# Patient Record
Sex: Female | Born: 1975 | Race: Black or African American | Hispanic: Yes | Marital: Single | Smoking: Former smoker
Health system: Southern US, Community
[De-identification: ages and names within clinical notes are randomized; demographics above are authoritative.]

## PROBLEM LIST (undated history)

## (undated) ENCOUNTER — Ambulatory Visit (HOSPITAL_COMMUNITY): Admission: EM | Payer: Medicaid Other | Source: Home / Self Care

## (undated) DIAGNOSIS — K219 Gastro-esophageal reflux disease without esophagitis: Secondary | ICD-10-CM

## (undated) DIAGNOSIS — R7303 Prediabetes: Secondary | ICD-10-CM

## (undated) DIAGNOSIS — D649 Anemia, unspecified: Secondary | ICD-10-CM

## (undated) DIAGNOSIS — Z5189 Encounter for other specified aftercare: Secondary | ICD-10-CM

## (undated) DIAGNOSIS — I10 Essential (primary) hypertension: Secondary | ICD-10-CM

## (undated) HISTORY — DX: Anemia, unspecified: D64.9

## (undated) HISTORY — DX: Encounter for other specified aftercare: Z51.89

## (undated) HISTORY — DX: Essential (primary) hypertension: I10

## (undated) HISTORY — DX: Prediabetes: R73.03

## (undated) HISTORY — DX: Gastro-esophageal reflux disease without esophagitis: K21.9

## (undated) HISTORY — PX: DILATION AND CURETTAGE OF UTERUS: SHX78

## (undated) HISTORY — PX: SKIN GRAFT: SHX250

---

## 2016-03-19 ENCOUNTER — Ambulatory Visit: Payer: Medicaid Other | Admitting: Internal Medicine

## 2016-04-16 ENCOUNTER — Ambulatory Visit: Payer: Medicaid Other | Admitting: Internal Medicine

## 2016-04-17 ENCOUNTER — Ambulatory Visit: Payer: Medicaid Other | Attending: Internal Medicine | Admitting: Internal Medicine

## 2016-04-17 ENCOUNTER — Encounter: Payer: Self-pay | Admitting: Internal Medicine

## 2016-04-17 VITALS — BP 124/85 | HR 108 | Temp 98.6°F | Resp 16 | Ht 66.5 in | Wt 212.0 lb

## 2016-04-17 DIAGNOSIS — Z720 Tobacco use: Secondary | ICD-10-CM

## 2016-04-17 DIAGNOSIS — Z6833 Body mass index (BMI) 33.0-33.9, adult: Secondary | ICD-10-CM | POA: Diagnosis not present

## 2016-04-17 DIAGNOSIS — N920 Excessive and frequent menstruation with regular cycle: Secondary | ICD-10-CM | POA: Insufficient documentation

## 2016-04-17 DIAGNOSIS — D259 Leiomyoma of uterus, unspecified: Secondary | ICD-10-CM | POA: Insufficient documentation

## 2016-04-17 DIAGNOSIS — K219 Gastro-esophageal reflux disease without esophagitis: Secondary | ICD-10-CM

## 2016-04-17 DIAGNOSIS — M79621 Pain in right upper arm: Secondary | ICD-10-CM | POA: Diagnosis present

## 2016-04-17 DIAGNOSIS — T86829 Unspecified complication of skin graft (allograft) (autograft): Secondary | ICD-10-CM

## 2016-04-17 DIAGNOSIS — N926 Irregular menstruation, unspecified: Secondary | ICD-10-CM

## 2016-04-17 LAB — CBC WITH DIFFERENTIAL/PLATELET
BASOS ABS: 0 {cells}/uL (ref 0–200)
Basophils Relative: 0 %
EOS PCT: 3 %
Eosinophils Absolute: 282 cells/uL (ref 15–500)
HCT: 40.3 % (ref 35.0–45.0)
HEMOGLOBIN: 13.4 g/dL (ref 11.7–15.5)
LYMPHS ABS: 3290 {cells}/uL (ref 850–3900)
LYMPHS PCT: 35 %
MCH: 25.8 pg — AB (ref 27.0–33.0)
MCHC: 33.3 g/dL (ref 32.0–36.0)
MCV: 77.5 fL — ABNORMAL LOW (ref 80.0–100.0)
MONOS PCT: 8 %
MPV: 9.3 fL (ref 7.5–12.5)
Monocytes Absolute: 752 cells/uL (ref 200–950)
NEUTROS PCT: 54 %
Neutro Abs: 5076 cells/uL (ref 1500–7800)
Platelets: 411 10*3/uL — ABNORMAL HIGH (ref 140–400)
RBC: 5.2 MIL/uL — AB (ref 3.80–5.10)
RDW: 15.1 % — AB (ref 11.0–15.0)
WBC: 9.4 10*3/uL (ref 3.8–10.8)

## 2016-04-17 NOTE — Patient Instructions (Signed)
Smoking Cessation, Tips for Success If you are ready to quit smoking, congratulations! You have chosen to help yourself be healthier. Cigarettes bring nicotine, tar, carbon monoxide, and other irritants into your body. Your lungs, heart, and blood vessels will be able to work better without these poisons. There are many different ways to quit smoking. Nicotine gum, nicotine patches, a nicotine inhaler, or nicotine nasal spray can help with physical craving. Hypnosis, support groups, and medicines help break the habit of smoking. WHAT THINGS CAN I DO TO MAKE QUITTING EASIER?  Here are some tips to help you quit for good:  Pick a date when you will quit smoking completely. Tell all of your friends and family about your plan to quit on that date.  Do not try to slowly cut down on the number of cigarettes you are smoking. Pick a quit date and quit smoking completely starting on that day.  Throw away all cigarettes.   Clean and remove all ashtrays from your home, work, and car.  On a card, write down your reasons for quitting. Carry the card with you and read it when you get the urge to smoke.  Cleanse your body of nicotine. Drink enough water and fluids to keep your urine clear or pale yellow. Do this after quitting to flush the nicotine from your body.  Learn to predict your moods. Do not let a bad situation be your excuse to have a cigarette. Some situations in your life might tempt you into wanting a cigarette.  Never have "just one" cigarette. It leads to wanting another and another. Remind yourself of your decision to quit.  Change habits associated with smoking. If you smoked while driving or when feeling stressed, try other activities to replace smoking. Stand up when drinking your coffee. Brush your teeth after eating. Sit in a different chair when you read the paper. Avoid alcohol while trying to quit, and try to drink fewer caffeinated beverages. Alcohol and caffeine may urge you to  smoke.  Avoid foods and drinks that can trigger a desire to smoke, such as sugary or spicy foods and alcohol.  Ask people who smoke not to smoke around you.  Have something planned to do right after eating or having a cup of coffee. For example, plan to take a walk or exercise.  Try a relaxation exercise to calm you down and decrease your stress. Remember, you may be tense and nervous for the first 2 weeks after you quit, but this will pass.  Find new activities to keep your hands busy. Play with a pen, coin, or rubber band. Doodle or draw things on paper.  Brush your teeth right after eating. This will help cut down on the craving for the taste of tobacco after meals. You can also try mouthwash.   Use oral substitutes in place of cigarettes. Try using lemon drops, carrots, cinnamon sticks, or chewing gum. Keep them handy so they are available when you have the urge to smoke.  When you have the urge to smoke, try deep breathing.  Designate your home as a nonsmoking area.  If you are a heavy smoker, ask your health care provider about a prescription for nicotine chewing gum. It can ease your withdrawal from nicotine.  Reward yourself. Set aside the cigarette money you save and buy yourself something nice.  Look for support from others. Join a support group or smoking cessation program. Ask someone at home or at work to help you with your plan   to quit smoking.  Always ask yourself, "Do I need this cigarette or is this just a reflex?" Tell yourself, "Today, I choose not to smoke," or "I do not want to smoke." You are reminding yourself of your decision to quit.  Do not replace cigarette smoking with electronic cigarettes (commonly called e-cigarettes). The safety of e-cigarettes is unknown, and some may contain harmful chemicals.  If you relapse, do not give up! Plan ahead and think about what you will do the next time you get the urge to smoke. HOW WILL I FEEL WHEN I QUIT SMOKING? You  may have symptoms of withdrawal because your body is used to nicotine (the addictive substance in cigarettes). You may crave cigarettes, be irritable, feel very hungry, cough often, get headaches, or have difficulty concentrating. The withdrawal symptoms are only temporary. They are strongest when you first quit but will go away within 10-14 days. When withdrawal symptoms occur, stay in control. Think about your reasons for quitting. Remind yourself that these are signs that your body is healing and getting used to being without cigarettes. Remember that withdrawal symptoms are easier to treat than the major diseases that smoking can cause.  Even after the withdrawal is over, expect periodic urges to smoke. However, these cravings are generally short lived and will go away whether you smoke or not. Do not smoke! WHAT RESOURCES ARE AVAILABLE TO HELP ME QUIT SMOKING? Your health care provider can direct you to community resources or hospitals for support, which may include:  Group support.  Education.  Hypnosis.  Therapy.   This information is not intended to replace advice given to you by your health care provider. Make sure you discuss any questions you have with your health care provider.   Document Released: 08/17/2004 Document Revised: 12/10/2014 Document Reviewed: 05/07/2013 Elsevier Interactive Patient Education 2016 [place] to [employer] Exercising can help you to lose weight. In order to lose weight through exercise, you need to do vigorous-intensity exercise. You can tell that you are exercising with vigorous intensity if you are breathing very hard and fast and cannot hold a conversation while exercising. Moderate-intensity exercise helps to maintain your current weight. You can tell that you are exercising at a moderate level if you have a higher heart rate and faster breathing, but you are still able to hold a conversation. HOW OFTEN SHOULD I EXERCISE? Choose  an activity that you enjoy and set realistic goals. Your health care provider can help you to make an activity plan that works for you. Exercise regularly as directed by your health care provider. This may include:  Doing resistance training twice each week, such as:  Push-ups.  Sit-ups.  Lifting weights.  Using resistance bands.  Doing a given intensity of exercise for a given amount of time. Choose from these options:  150 minutes of moderate-intensity exercise every week.  75 minutes of vigorous-intensity exercise every week.  A mix of moderate-intensity and vigorous-intensity exercise every week. Children, pregnant women, people who are out of shape, people who are overweight, and older adults may need to consult a health care provider for individual recommendations. If you have any sort of medical condition, be sure to consult your health care provider before starting a new exercise program. WHAT ARE SOME ACTIVITIES THAT CAN HELP ME TO LOSE WEIGHT?   Walking at a rate of at least 4.5 miles an hour.  Jogging or running at a rate of 5 miles per hour.  Biking at a rate of at least 10 miles per hour.  Lap swimming.  Roller-skating or in-line skating.  Cross-country skiing.  Vigorous competitive sports, such as football, basketball, and soccer.  Jumping rope.  Aerobic dancing. HOW CAN I BE MORE ACTIVE IN MY DAY-TO-DAY ACTIVITIES?  Use the stairs instead of the elevator.  Take a walk during your lunch break.  If you drive, park your car farther away from work or school.  If you take public transportation, get off one stop early and walk the rest of the way.  Make all of your phone calls while standing up and walking around.  Get up, stretch, and walk around every 30 minutes throughout the day. WHAT GUIDELINES SHOULD I FOLLOW WHILE EXERCISING?  Do not exercise so much that you hurt yourself, feel dizzy, or get very short of breath.  Consult your health care  provider prior to starting a new exercise program.  Wear comfortable clothes and shoes with good support.  Drink plenty of water while you exercise to prevent dehydration or heat stroke. Body water is lost during exercise and must be replaced.  Work out until you breathe faster and your heart beats faster.     This information is not intended to replace advice given to you by your health care provider. Make sure you discuss any questions you have with your health care provider.   Document Released: 12/22/2010 Document Revised: 12/10/2014 Document Reviewed: 04/22/2014 Elsevier Interactive Patient Education 2016 [place] for [employer] Calories are energy you get from the things you eat and drink. Your body uses this energy to keep you going throughout the day. The number of calories you eat affects your weight. When you eat more calories than your body needs, your body stores the extra calories as fat. When you eat fewer calories than your body needs, your body burns fat to get the energy it needs. Calorie counting means keeping track of how many calories you eat and drink each day. If you make sure to eat fewer calories than your body needs, you should lose weight. In order for calorie counting to work, you will need to eat the number of calories that are right for you in a day to lose a healthy amount of weight per week. A healthy amount of weight to lose per week is usually 1-2 lb (0.5-0.9 kg). A dietitian can determine how many calories you need in a day and give you suggestions on how to reach your calorie goal.  WHAT IS MY MY PLAN? My goal is to have __________ calories per day.  If I have this many calories per day, I should lose around __________ pounds per week. WHAT DO I NEED TO KNOW ABOUT CALORIE COUNTING? In order to meet your daily calorie goal, you will need to:  Find out how many calories are in each food you would like to eat. Try to do this before  you eat.  Decide how much of the food you can eat.  Write down what you ate and how many calories it had. Doing this is called keeping a food log. WHERE DO I FIND CALORIE INFORMATION? The number of calories in a food can be found on a Nutrition Facts label. Note that all the information on a label is based on a specific serving of the food. If a food does not have a Nutrition Facts label, try to look up the calories online or ask your dietitian for  help. HOW DO I DECIDE HOW MUCH TO EAT? To decide how much of the food you can eat, you will need to consider both the number of calories in one serving and the size of one serving. This information can be found on the Nutrition Facts label. If a food does not have a Nutrition Facts label, look up the information online or ask your dietitian for help. Remember that calories are listed per serving. If you choose to have more than one serving of a food, you will have to multiply the calories per serving by the amount of servings you plan to eat. For example, the label on a package of bread might say that a serving size is 1 slice and that there are 90 calories in a serving. If you eat 1 slice, you will have eaten 90 calories. If you eat 2 slices, you will have eaten 180 calories. HOW DO I KEEP A FOOD LOG? After each meal, record the following information in your food log:  What you ate.  How much of it you ate.  How many calories it had.  Then, add up your calories. Keep your food log near you, such as in a small notebook in your pocket. Another option is to use a mobile app or website. Some programs will calculate calories for you and show you how many calories you have left each time you add an item to the log. WHAT ARE SOME CALORIE COUNTING TIPS?  Use your calories on foods and drinks that will fill you up and not leave you hungry. Some examples of this include foods like nuts and nut butters, vegetables, lean proteins, and high-fiber foods (more  than 5 g fiber per serving).  Eat nutritious foods and avoid empty calories. Empty calories are calories you get from foods or beverages that do not have many nutrients, such as candy and soda. It is better to have a nutritious high-calorie food (such as an avocado) than a food with few nutrients (such as a bag of chips).  Know how many calories are in the foods you eat most often. This way, you do not have to look up how many calories they have each time you eat them.  Look out for foods that may seem like low-calorie foods but are really high-calorie foods, such as baked goods, soda, and fat-free candy.  Pay attention to calories in drinks. Drinks such as sodas, specialty coffee drinks, alcohol, and juices have a lot of calories yet do not fill you up. Choose low-calorie drinks like water and diet drinks.  Focus your calorie counting efforts on higher calorie items. Logging the calories in a garden salad that contains only vegetables is less important than calculating the calories in a milk shake.  Find a way of tracking calories that works for you. Get creative. Most people who are successful find ways to keep track of how much they eat in a day, even if they do not count every calorie. WHAT ARE SOME PORTION CONTROL TIPS?  Know how many calories are in a serving. This will help you know how many servings of a certain food you can have.  Use a measuring cup to measure serving sizes. This is helpful when you start out. With time, you will be able to estimate serving sizes for some foods.  Take some time to put servings of different foods on your favorite plates, bowls, and cups so you know what a serving looks like.  Try not to  eat straight from a bag or box. Doing this can lead to overeating. Put the amount you would like to eat in a cup or on a plate to make sure you are eating the right portion.  Use smaller plates, glasses, and bowls to prevent overeating. This is a quick and easy way to  practice portion control. If your plate is smaller, less food can fit on it.  Try not to multitask while eating, such as watching TV or using your computer. If it is time to eat, sit down at a table and enjoy your food. Doing this will help you to start recognizing when you are full. It will also make you more aware of what and how much you are eating. HOW CAN I CALORIE COUNT WHEN EATING OUT?  Ask for smaller portion sizes or child-sized portions.  Consider sharing an entree and sides instead of getting your own entree.  If you get your own entree, eat only half. Ask for a box at the beginning of your meal and put the rest of your entree in it so you are not tempted to eat it.  Look for the calories on the menu. If calories are listed, choose the lower calorie options.  Choose dishes that include vegetables, fruits, whole grains, low-fat dairy products, and lean protein. Focusing on smart food choices from each of the 5 food groups can help you stay on track at restaurants.  Choose items that are boiled, broiled, grilled, or steamed.  Choose water, milk, unsweetened iced tea, or other drinks without added sugars. If you want an alcoholic beverage, choose a lower calorie option. For example, a regular margarita can have up to 700 calories and a glass of wine has around 150.  Stay away from items that are buttered, battered, fried, or served with cream sauce. Items labeled "crispy" are usually fried, unless stated otherwise.  Ask for dressings, sauces, and syrups on the side. These are usually very high in calories, so do not eat much of them.  Watch out for salads. Many people think salads are a healthy option, but this is often not the case. Many salads come with bacon, fried chicken, lots of cheese, fried chips, and dressing. All of these items have a lot of calories. If you want a salad, choose a garden salad and ask for grilled meats or steak. Ask for the dressing on the side, or ask for  olive oil and vinegar or lemon to use as dressing.  Estimate how many servings of a food you are given. For example, a serving of cooked rice is  cup or about the size of half a tennis ball or one cupcake wrapper. Knowing serving sizes will help you be aware of how much food you are eating at restaurants. The list below tells you how big or small some common portion sizes are based on everyday objects.  1 oz--4 stacked dice.  3 oz--1 deck of cards.  1 tsp--1 dice.  1 Tbsp-- a Ping-Pong ball.  2 Tbsp--1 Ping-Pong ball.   cup--1 tennis ball or 1 cupcake wrapper.  1 cup--1 baseball.   This information is not intended to replace advice given to you by your health care provider. Make sure you discuss any questions you have with your health care provider.   Document Released: 11/19/2005 Document Revised: 12/10/2014 Document Reviewed: 09/24/2013 Elsevier Interactive Patient Education 2016 [employer] Inc[employer]  -  Gastroesophageal Reflux Disease, Adult Normally, food travels down the esophagus and stays in  the stomach to be digested. If a person has gastroesophageal reflux disease (GERD), food and stomach acid move back up into the esophagus. When this happens, the esophagus becomes sore and swollen (inflamed). Over time, GERD can make small holes (ulcers) in the lining of the esophagus. HOME CARE Diet  Follow a diet as told by your doctor. You may need to avoid foods and drinks such as:  Coffee and tea (with or without caffeine).  Drinks that contain alcohol.  Energy drinks and sports drinks.  Carbonated drinks or sodas.  Chocolate and cocoa.  Peppermint and mint flavorings.  Garlic and onions.  Horseradish.  Spicy and acidic foods, such as peppers, chili powder, curry powder, vinegar, hot sauces, and BBQ sauce.  Citrus fruit juices and citrus fruits, such as oranges, lemons, and limes.  Tomato-based foods, such as red sauce, chili, salsa, and pizza with red sauce.  Fried  and fatty foods, such as donuts, french fries, potato chips, and high-fat dressings.  High-fat meats, such as hot dogs, rib eye steak, sausage, ham, and bacon.  High-fat dairy items, such as whole milk, butter, and cream cheese.  Eat small meals often. Avoid eating large meals.  Avoid drinking large amounts of liquid with your meals.  Avoid eating meals during the 2-3 hours before bedtime.  Avoid lying down right after you eat.  Do not exercise right after you eat. General Instructions  Pay attention to any changes in your symptoms.  Take over-the-counter and prescription medicines only as told by your doctor. Do not take aspirin, ibuprofen, or other NSAIDs unless your doctor says it is okay.  Do not use any tobacco products, including cigarettes, chewing tobacco, and e-cigarettes. If you need help quitting, ask your doctor.  Wear loose clothes. Do not wear anything tight around your waist.  Raise (elevate) the head of your bed about 6 inches (15 cm).  Try to lower your stress. If you need help doing this, ask your doctor.  If you are overweight, lose an amount of weight that is healthy for you. Ask your doctor about a safe weight loss goal.  Keep all follow-up visits as told by your doctor. This is important. GET HELP IF:  You have new symptoms.  You lose weight and you do not know why it is happening.  You have trouble swallowing, or it hurts to swallow.  You have wheezing or a cough that keeps happening.  Your symptoms do not get better with treatment.  You have a hoarse voice. GET HELP RIGHT AWAY IF:  You have pain in your arms, neck, jaw, teeth, or back.  You feel sweaty, dizzy, or light-headed.  You have chest pain or shortness of breath.  You throw up (vomit) and your throw up looks like blood or coffee grounds.  You pass out (faint).  Your poop (stool) is bloody or black.  You cannot swallow, drink, or eat.   This information is not intended to  replace advice given to you by your health care provider. Make sure you discuss any questions you have with your health care provider.   Document Released: 05/07/2008 Document Revised: 08/10/2015 Document Reviewed: 03/16/2015 Elsevier Interactive Patient Education [employer].
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
## 2016-04-17 NOTE — Progress Notes (Signed)
Establish Care, dry cough at night  Rt shoulder and arm pain  Referral dental, eye dr  Medicine refills  Pain scale #5 Tobacco user 1/2 ppday  No suicidal thoughts in the past two weeks

## 2016-04-18 ENCOUNTER — Other Ambulatory Visit: Payer: Self-pay | Admitting: *Deleted

## 2016-04-18 ENCOUNTER — Other Ambulatory Visit: Payer: Self-pay | Admitting: Internal Medicine

## 2016-04-18 DIAGNOSIS — R7989 Other specified abnormal findings of blood chemistry: Secondary | ICD-10-CM

## 2016-04-18 DIAGNOSIS — R718 Other abnormality of red blood cells: Secondary | ICD-10-CM

## 2016-04-18 DIAGNOSIS — Z13220 Encounter for screening for lipoid disorders: Secondary | ICD-10-CM

## 2016-04-18 DIAGNOSIS — K219 Gastro-esophageal reflux disease without esophagitis: Secondary | ICD-10-CM

## 2016-04-18 LAB — HEMOGLOBIN A1C
HEMOGLOBIN A1C: 6.4 % — AB (ref <5.7)
MEAN PLASMA GLUCOSE: 137 mg/dL

## 2016-04-18 LAB — BASIC METABOLIC PANEL WITHOUT GFR
BUN: 17 mg/dL (ref 7–25)
CO2: 23 mmol/L (ref 20–31)
Calcium: 9.5 mg/dL (ref 8.6–10.2)
Chloride: 108 mmol/L (ref 98–110)
Creat: 0.63 mg/dL (ref 0.50–1.10)
GFR, Est African American: >89 mL/min
GFR, Est Non African American: >89 mL/min
Glucose, Bld: 110 mg/dL — ABNORMAL HIGH (ref 65–99)
Potassium: 4 mmol/L (ref 3.5–5.3)
Sodium: 138 mmol/L (ref 135–146)

## 2016-04-18 LAB — TSH: TSH: 0.31 m[IU]/L — ABNORMAL LOW

## 2016-04-18 MED ORDER — METFORMIN HCL 500 MG PO TABS: 500.0000 mg | ORAL_TABLET | Freq: Two times a day (BID) | ORAL | Status: DC

## 2016-05-02 ENCOUNTER — Ambulatory Visit: Payer: Medicaid Other | Attending: Internal Medicine

## 2016-05-02 ENCOUNTER — Other Ambulatory Visit: Payer: Self-pay | Admitting: *Deleted

## 2016-05-02 DIAGNOSIS — D473 Essential (hemorrhagic) thrombocythemia: Secondary | ICD-10-CM | POA: Diagnosis not present

## 2016-05-02 DIAGNOSIS — R946 Abnormal results of thyroid function studies: Secondary | ICD-10-CM

## 2016-05-02 DIAGNOSIS — Z13220 Encounter for screening for lipoid disorders: Secondary | ICD-10-CM

## 2016-05-02 DIAGNOSIS — D75839 Thrombocytosis, unspecified: Secondary | ICD-10-CM

## 2016-05-02 DIAGNOSIS — R7303 Prediabetes: Secondary | ICD-10-CM

## 2016-05-02 LAB — LIPID PANEL
CHOL/HDL RATIO: 4.2 ratio (ref <=5.0)
Cholesterol: 173 mg/dL (ref 125–200)
HDL: 41 mg/dL — ABNORMAL LOW (ref >=46)
LDL Cholesterol: 118 mg/dL (ref <130)
Triglycerides: 70 mg/dL (ref <150)
VLDL: 14 mg/dL (ref <30)

## 2016-05-02 LAB — T4, FREE: Free T4: 1.2 ng/dL (ref 0.8–1.8)

## 2016-05-02 LAB — SEDIMENTATION RATE: SED RATE: 9 mm/h (ref 0–20)

## 2016-05-02 MED ORDER — METFORMIN HCL 500 MG PO TABS: 500.0000 mg | ORAL_TABLET | Freq: Two times a day (BID) | ORAL | Status: DC

## 2016-05-02 NOTE — Patient Instructions (Signed)
Thank you for coming in for your Hep B vaccine today.

## 2016-05-02 NOTE — Progress Notes (Signed)
Patient presented to the Office today for a Hep B injection. Patient received lab results and had returning blood work completed as well. No further concerns at this time.

## 2016-05-02 NOTE — Progress Notes (Signed)
Patient tolerated HEP B injection well today. Patient has no concerns at this time.

## 2016-05-02 NOTE — Telephone Encounter (Signed)
Patients Metformin was reordered due to PCP not being accepted by Medicaid system just yet.

## 2016-05-03 ENCOUNTER — Telehealth: Payer: Self-pay | Admitting: *Deleted

## 2016-05-03 NOTE — Telephone Encounter (Signed)
-----   Message from Maren Reamer, MD sent at 05/03/2016 10:32 AM EDT ----- Please call w/ results; cholesterol panel looks great. Keep up good healthy diet.  Thyroid labs normal on repeat and no signs of inflammation on testing.  Thank you

## 2016-05-03 NOTE — Telephone Encounter (Signed)
Patient verified DOB Patient is aware of results for cholesterol looking great. Patient is also aware of thyroid being normal and no inflammation being present. Patient expressed her understanding and had no further questions at this time.

## 2016-06-06 ENCOUNTER — Emergency Department (HOSPITAL_COMMUNITY)
Admission: EM | Admit: 2016-06-06 | Discharge: 2016-06-06 | Disposition: A | Discharge status: Home / Self Care | Payer: Medicaid Other | Attending: Emergency Medicine | Admitting: Emergency Medicine

## 2016-06-06 ENCOUNTER — Encounter (HOSPITAL_COMMUNITY): Payer: Self-pay | Admitting: Emergency Medicine

## 2016-06-06 ENCOUNTER — Encounter: Payer: Medicaid Other | Admitting: Obstetrics and Gynecology

## 2016-06-06 DIAGNOSIS — R112 Nausea with vomiting, unspecified: Secondary | ICD-10-CM | POA: Diagnosis not present

## 2016-06-06 DIAGNOSIS — Z7984 Long term (current) use of oral hypoglycemic drugs: Secondary | ICD-10-CM | POA: Diagnosis not present

## 2016-06-06 DIAGNOSIS — F17200 Nicotine dependence, unspecified, uncomplicated: Secondary | ICD-10-CM | POA: Diagnosis not present

## 2016-06-06 LAB — URINALYSIS, ROUTINE W REFLEX MICROSCOPIC
Bilirubin Urine: NEGATIVE
GLUCOSE, UA: NEGATIVE mg/dL
Hgb urine dipstick: NEGATIVE
Ketones, ur: NEGATIVE mg/dL
LEUKOCYTES UA: NEGATIVE
Nitrite: NEGATIVE
PROTEIN: NEGATIVE mg/dL
Specific Gravity, Urine: 1.008 (ref 1.005–1.030)
pH: 6.5 (ref 5.0–8.0)

## 2016-06-06 LAB — COMPREHENSIVE METABOLIC PANEL
ALBUMIN: 3.7 g/dL (ref 3.5–5.0)
ALT: 20 U/L (ref 14–54)
AST: 20 U/L (ref 15–41)
Alkaline Phosphatase: 90 U/L (ref 38–126)
Anion gap: 9 (ref 5–15)
BUN: 9 mg/dL (ref 6–20)
CHLORIDE: 103 mmol/L (ref 101–111)
CO2: 22 mmol/L (ref 22–32)
Calcium: 9.2 mg/dL (ref 8.9–10.3)
Creatinine, Ser: 0.67 mg/dL (ref 0.44–1.00)
GFR calc Af Amer: >60 mL/min (ref >60)
Glucose, Bld: 115 mg/dL — ABNORMAL HIGH (ref 65–99)
POTASSIUM: 3.2 mmol/L — AB (ref 3.5–5.1)
Sodium: 134 mmol/L — ABNORMAL LOW (ref 135–145)
Total Bilirubin: 0.6 mg/dL (ref 0.3–1.2)
Total Protein: 7.4 g/dL (ref 6.5–8.1)

## 2016-06-06 LAB — CBC WITH DIFFERENTIAL/PLATELET
BASOS PCT: 0 %
Basophils Absolute: 0 10*3/uL (ref 0.0–0.1)
EOS ABS: 0 10*3/uL (ref 0.0–0.7)
Eosinophils Relative: 0 %
HCT: 39.7 % (ref 36.0–46.0)
HEMOGLOBIN: 13 g/dL (ref 12.0–15.0)
Lymphocytes Relative: 13 %
Lymphs Abs: 2.4 10*3/uL (ref 0.7–4.0)
MCH: 26.1 pg (ref 26.0–34.0)
MCHC: 32.7 g/dL (ref 30.0–36.0)
MCV: 79.7 fL (ref 78.0–100.0)
Monocytes Absolute: 1.6 10*3/uL — ABNORMAL HIGH (ref 0.1–1.0)
Monocytes Relative: 9 %
NEUTROS ABS: 14.4 10*3/uL — AB (ref 1.7–7.7)
NEUTROS PCT: 78 %
PLATELETS: 334 10*3/uL (ref 150–400)
RBC: 4.98 MIL/uL (ref 3.87–5.11)
RDW: 14.7 % (ref 11.5–15.5)
WBC: 18.4 10*3/uL — AB (ref 4.0–10.5)

## 2016-06-06 LAB — LIPASE, BLOOD: Lipase: 20 U/L (ref 11–51)

## 2016-06-06 LAB — POC URINE PREG, ED: PREG TEST UR: NEGATIVE

## 2016-06-06 MED ORDER — SODIUM CHLORIDE 0.9 % IV BOLUS (SEPSIS)
1000.0000 mL | Freq: Once | INTRAVENOUS | Status: AC
  Administered 2016-06-06: 1000 mL via INTRAVENOUS

## 2016-06-06 MED ORDER — POTASSIUM CHLORIDE CRYS ER 20 MEQ PO TBCR
40.0000 meq | EXTENDED_RELEASE_TABLET | Freq: Once | ORAL | Status: AC
  Administered 2016-06-06: 40 meq via ORAL
  Filled 2016-06-06: qty 2

## 2016-06-06 MED ORDER — ONDANSETRON 4 MG PO TBDP: 4.0000 mg | ORAL_TABLET | Freq: Three times a day (TID) | ORAL | Status: DC | PRN

## 2016-06-06 MED ORDER — ONDANSETRON HCL 4 MG/2ML IJ SOLN
4.0000 mg | Freq: Once | INTRAMUSCULAR | Status: AC
  Administered 2016-06-06: 4 mg via INTRAVENOUS
  Filled 2016-06-06: qty 2

## 2016-06-06 MED ORDER — ACETAMINOPHEN 500 MG PO TABS
1000.0000 mg | ORAL_TABLET | Freq: Once | ORAL | Status: AC
  Administered 2016-06-06: 1000 mg via ORAL
  Filled 2016-06-06: qty 2

## 2016-06-06 MED ORDER — SODIUM CHLORIDE 0.9 % IV BOLUS (SEPSIS): 1000.0000 mL | Freq: Once | INTRAVENOUS | Status: DC

## 2016-06-06 NOTE — ED Provider Notes (Signed)
CSN: [id number]     Arrival date & time 06/06/16  1103 History   First MD Initiated Contact with Patient 06/06/16 1111     Chief Complaint  Patient presents with  . Generalized Body Aches  . Vomiting     (Consider location/radiation/quality/duration/timing/severity/associated sxs/prior Treatment) Patient is a 40 y.o. female presenting with general illness. The history is provided by the patient.  Illness Severity:  Moderate Onset quality:  Sudden Duration:  2 days Timing:  Constant Progression:  Worsening Chronicity:  New Associated symptoms: nausea and vomiting   Associated symptoms: no abdominal pain, no chest pain, no congestion, no diarrhea, no fever, no headaches, no myalgias, no rhinorrhea, no shortness of breath and no wheezing    40 yo F with n/v.  Going on for past couple days.  Denies fevers, abdominal pain.  Denies sick contacts.  Deniers prior episodes similar.  Denies bilious or bloody emesis.    History reviewed. No pertinent past medical history. Past Surgical History  Procedure Laterality Date  . Cesarean section      2014 1995  . Skin graft      at age 65   Family History  Problem Relation Age of Onset  . Asthma Mother   . Hypertension Mother   . Asthma Brother    Social History  Substance Use Topics  . Smoking status: Current Every Day Smoker  . Smokeless tobacco: None  . Alcohol Use: No   OB History    No data available     Review of Systems  Constitutional: Negative for fever and chills.  HENT: Negative for congestion and rhinorrhea.   Eyes: Negative for redness and visual disturbance.  Respiratory: Negative for shortness of breath and wheezing.   Cardiovascular: Negative for chest pain and palpitations.  Gastrointestinal: Positive for nausea and vomiting. Negative for abdominal pain, diarrhea and constipation.  Genitourinary: Negative for dysuria and urgency.  Musculoskeletal: Negative for myalgias and arthralgias.  Skin: Negative for  pallor and wound.  Neurological: Negative for dizziness and headaches.      Allergies  Review of patient's allergies indicates no known allergies.  Home Medications   Prior to Admission medications   Medication Sig Start Date End Date Taking? Authorizing Provider  metFORMIN (GLUCOPHAGE) 500 MG tablet Take 1 tablet (500 mg total) by mouth 2 (two) times daily with a meal. 05/02/16   Olugbemiga E Doreene Burke, MD  ondansetron (ZOFRAN ODT) 4 MG disintegrating tablet Take 1 tablet (4 mg total) by mouth every 8 (eight) hours as needed for nausea or vomiting. 06/06/16   Deno Etienne, DO   BP 118/74 mmHg  Pulse 98  Temp(Src) 99.4 F (37.4 C) (Oral)  Resp 18  Ht 5\' 6"  (1.676 m)  Wt 212 lb (96.163 kg)  BMI 34.23 kg/m2  SpO2 98% Physical Exam  Constitutional: She is oriented to person, place, and time. She appears well-developed and well-nourished. No distress.  HENT:  Head: Normocephalic and atraumatic.  Eyes: EOM are normal. Pupils are equal, round, and reactive to light.  Neck: Normal range of motion. Neck supple.  Cardiovascular: Normal rate and regular rhythm.  Exam reveals no gallop and no friction rub.   No murmur heard. Pulmonary/Chest: Effort normal. She has no wheezes. She has no rales.  Abdominal: Soft. She exhibits no distension. There is no tenderness (negative murphys sign). There is no rebound and no guarding.  Musculoskeletal: She exhibits no edema or tenderness.  Neurological: She is alert and oriented to person, place,  and time.  Skin: Skin is warm and dry. She is not diaphoretic.  Psychiatric: She has a normal mood and affect. Her behavior is normal.  Nursing note and vitals reviewed.   ED Course  Procedures (including critical care time) Labs Review Labs Reviewed  CBC WITH DIFFERENTIAL/PLATELET - Abnormal; Notable for the following:    WBC 18.4 (*)    Neutro Abs 14.4 (*)    Monocytes Absolute 1.6 (*)    All other components within normal limits  COMPREHENSIVE METABOLIC  PANEL - Abnormal; Notable for the following:    Sodium 134 (*)    Potassium 3.2 (*)    Glucose, Bld 115 (*)    All other components within normal limits  LIPASE, BLOOD  URINALYSIS, ROUTINE W REFLEX MICROSCOPIC (NOT AT [facility])  POC URINE PREG, ED    Imaging Review No results found. I have personally reviewed and evaluated these images and lab results as part of my medical decision-making.   EKG Interpretation None      MDM   Final diagnoses:  Non-intractable vomiting with nausea, vomiting of unspecified type    40 yo F with nausea and vomiting. Going on for past couple days.    Patient with no noted abdominal pain.  Suspect most likely viral in nature, treat symptomatically.  Labs reassuring.   Tolerating PO.   I have discussed the diagnosis/risks/treatment options with the patient and believe the pt to be eligible for discharge home to follow-up with PCP. We also discussed returning to the ED immediately if new or worsening sx occur. We discussed the sx which are most concerning (e.g., sudden worsening pain, fever, inability to tolerate by mouth) that necessitate immediate return. Medications administered to the patient during their visit and any new prescriptions provided to the patient are listed below.  Medications given during this visit Medications  sodium chloride 0.9 % bolus 1,000 mL (not administered)  sodium chloride 0.9 % bolus 1,000 mL (0 mLs Intravenous Stopped 06/06/16 1341)  ondansetron (ZOFRAN) injection 4 mg (4 mg Intravenous Given 06/06/16 1153)  acetaminophen (TYLENOL) tablet 1,000 mg (1,000 mg Oral Given 06/06/16 1244)  potassium chloride SA (K-DUR,KLOR-CON) CR tablet 40 mEq (40 mEq Oral Given 06/06/16 1343)    New Prescriptions   ONDANSETRON (ZOFRAN ODT) 4 MG DISINTEGRATING TABLET    Take 1 tablet (4 mg total) by mouth every 8 (eight) hours as needed for nausea or vomiting.    The patient appears reasonably screen and/or stabilized for discharge and I doubt any  other medical condition or other [facility] requiring further screening, evaluation, or treatment in the ED at this time prior to discharge.    Deno Etienne, DO 06/06/16 1511

## 2016-06-06 NOTE — Discharge Instructions (Signed)

## 2016-06-06 NOTE — ED Notes (Signed)
Pt sts body aches and chills starting yesterday; pt sts vomiting x 1 yesterday

## 2016-06-13 ENCOUNTER — Ambulatory Visit (HOSPITAL_COMMUNITY)
Admission: EM | Admit: 2016-06-13 | Discharge: 2016-06-13 | Disposition: A | Discharge status: Home / Self Care | Payer: Medicaid Other | Attending: Family Medicine | Admitting: Family Medicine

## 2016-06-13 ENCOUNTER — Encounter (HOSPITAL_COMMUNITY): Payer: Self-pay | Admitting: Emergency Medicine

## 2016-06-13 DIAGNOSIS — H109 Unspecified conjunctivitis: Secondary | ICD-10-CM

## 2016-06-13 DIAGNOSIS — R059 Cough, unspecified: Secondary | ICD-10-CM

## 2016-06-13 DIAGNOSIS — J40 Bronchitis, not specified as acute or chronic: Secondary | ICD-10-CM

## 2016-06-13 DIAGNOSIS — R05 Cough: Secondary | ICD-10-CM

## 2016-06-13 MED ORDER — METHYLPREDNISOLONE 4 MG PO TBPK: ORAL_TABLET | ORAL | Status: DC

## 2016-06-13 MED ORDER — POLYMYXIN B-TRIMETHOPRIM 10000-0.1 UNIT/ML-% OP SOLN: 2.0000 [drp] | OPHTHALMIC | Status: DC

## 2016-06-13 MED ORDER — BENZONATATE 100 MG PO CAPS: 200.0000 mg | ORAL_CAPSULE | Freq: Three times a day (TID) | ORAL | Status: DC | PRN

## 2016-06-13 MED ORDER — AZITHROMYCIN 250 MG PO TABS
ORAL_TABLET | ORAL | Status: DC
Start: 2016-06-13 — End: 2016-06-21

## 2016-06-13 NOTE — Discharge Instructions (Signed)

## 2016-06-13 NOTE — ED Notes (Signed)
The patient presented to the [facility] with a complaint of bilateral eye irritation x 3 days as well as a cough.

## 2016-06-13 NOTE — ED Provider Notes (Signed)
CSN: [id number]     Arrival date & time 06/13/16  1502 History   None    Chief Complaint  Patient presents with  . Eye Problem  . Cough   (Consider location/radiation/quality/duration/timing/severity/associated sxs/prior Treatment) Patient is a 40 y.o. female presenting with eye problem and cough. The history is provided by the patient.  Eye Problem Location:  Both Quality:  Burning and tearing Severity:  Moderate Onset quality:  Sudden Duration:  3 days Timing:  Constant Progression:  Worsening Chronicity:  New Context: burn   Relieved by:  Nothing Worsened by:  Nothing tried Ineffective treatments:  None tried Associated symptoms: blurred vision, itching, redness and tearing   Cough   History reviewed. No pertinent past medical history. Past Surgical History  Procedure Laterality Date  . Cesarean section      2014 1995  . Skin graft      at age 58   Family History  Problem Relation Age of Onset  . Asthma Mother   . Hypertension Mother   . Asthma Brother    Social History  Substance Use Topics  . Smoking status: Current Every Day Smoker  . Smokeless tobacco: None  . Alcohol Use: No   OB History    No data available     Review of Systems  Constitutional: Positive for fatigue.  Eyes: Positive for blurred vision, redness and itching.  Respiratory: Positive for cough.   Cardiovascular: Negative.   Gastrointestinal: Negative.   Endocrine: Negative.   Genitourinary: Negative.   Musculoskeletal: Negative.   Allergic/Immunologic: Negative.   Neurological: Negative.   Hematological: Negative.   Psychiatric/Behavioral: Negative.     Allergies  Review of patient's allergies indicates no known allergies.  Home Medications   Prior to Admission medications   Medication Sig Start Date End Date Taking? Authorizing Provider  ondansetron (ZOFRAN ODT) 4 MG disintegrating tablet Take 1 tablet (4 mg total) by mouth every 8 (eight) hours as needed for nausea or  vomiting. 06/06/16  Yes Deno Etienne, DO  metFORMIN (GLUCOPHAGE) 500 MG tablet Take 1 tablet (500 mg total) by mouth 2 (two) times daily with a meal. 05/02/16   Tresa Garter, MD   Meds Ordered and Administered this Visit  Medications - No data to display  BP 126/87 mmHg  Pulse 105  Temp(Src) 98.5 F (36.9 C) (Oral)  Resp 12  SpO2 97%  LMP 05/30/2016 (Exact Date) No data found.   Physical Exam  Constitutional: She appears well-developed and well-nourished.  HENT:  Head: Normocephalic.  Right Ear: External ear normal.  Left Ear: External ear normal.  Mouth/Throat: Oropharynx is clear and moist.  Eyes: Pupils are equal, round, and reactive to light. Right eye exhibits discharge.  Bilateral conjunctiva injected  Neck: Normal range of motion. Neck supple.  Cardiovascular: Normal rate, regular rhythm and normal heart sounds.   Pulmonary/Chest: Effort normal.  Abdominal: Soft. Bowel sounds are normal.    ED Course  Procedures (including critical care time)  Labs Review Labs Reviewed - No data to display  Imaging Review No results found.   Visual Acuity Review  Right Eye Distance:   Left Eye Distance:   Bilateral Distance:    Right Eye Near:   Left Eye Near:    Bilateral Near:         MDM  Conjunctivitis  OU - Polytrim eye gtt's 2 gtt's OU q4 hours #10 ml Bronchitis - Zpak as directed, tessalon perles 200mg  one po tid prn #30,  Medrol dose pack as directed 4mg  #21    Lysbeth Penner, FNP 06/13/16 1557

## 2016-06-21 ENCOUNTER — Encounter: Payer: Self-pay | Admitting: Obstetrics and Gynecology

## 2016-06-21 ENCOUNTER — Other Ambulatory Visit (HOSPITAL_COMMUNITY)
Admission: RE | Admit: 2016-06-21 | Discharge: 2016-06-21 | Disposition: A | Discharge status: Home / Self Care | Payer: Medicaid Other | Source: Ambulatory Visit | Attending: Obstetrics and Gynecology | Admitting: Obstetrics and Gynecology

## 2016-06-21 ENCOUNTER — Ambulatory Visit (INDEPENDENT_AMBULATORY_CARE_PROVIDER_SITE_OTHER): Payer: Medicaid Other | Admitting: Obstetrics and Gynecology

## 2016-06-21 VITALS — BP 141/94 | HR 89 | Ht 66.0 in | Wt 215.0 lb

## 2016-06-21 DIAGNOSIS — Z124 Encounter for screening for malignant neoplasm of cervix: Secondary | ICD-10-CM | POA: Diagnosis not present

## 2016-06-21 DIAGNOSIS — N939 Abnormal uterine and vaginal bleeding, unspecified: Secondary | ICD-10-CM

## 2016-06-21 DIAGNOSIS — Z1151 Encounter for screening for human papillomavirus (HPV): Secondary | ICD-10-CM | POA: Insufficient documentation

## 2016-06-21 DIAGNOSIS — Z113 Encounter for screening for infections with a predominantly sexual mode of transmission: Secondary | ICD-10-CM | POA: Insufficient documentation

## 2016-06-21 DIAGNOSIS — Z01411 Encounter for gynecological examination (general) (routine) with abnormal findings: Secondary | ICD-10-CM | POA: Diagnosis present

## 2016-06-21 LAB — POCT PREGNANCY, URINE: Preg Test, Ur: NEGATIVE

## 2016-06-21 NOTE — Progress Notes (Signed)
Obstetrics and Gynecology Visit New Patient Evaluation  Appointment Date: 06/22/2016  OBGYN Clinic:[facility]  Primary Care Provider: Maren Reamer  Referring Provider: Maren Reamer, MD  Chief Complaint:  Chief Complaint  Patient presents with  . Referral    heavy periods x 2 years    History of Present Illness: Sabrina Larson is a 40 y.o. African-American [id number] (Patient's last menstrual period was 05/30/2016 (exact date).), seen for the above chief complaint. Her past medical history is significant for BMI 35, pre-DM, multiple surgeries on her cervix.  She is seen in consultation from  [place]. Patient states that she's always had a regular, monthly period that is about 3-5 days and heavy and painful initially, but for the past few months she's had a few days of intermenstrual brown discharge, spotting after her period.   Normal TFTs in 04/2016  No breast s/s, fevers, chills, chest pain, SOB, nausea, vomiting, abdominal pain, dysuria, hematuria, vaginal itching, dyspareunia, diarrhea, constipation, blood in BMs  Review of Systems: Her 12 point review of systems is negative or as noted in the History of Present Illness.  Past Medical History:  Past Medical History  Diagnosis Date  . GERD (gastroesophageal reflux disease)   . Prediabetes     Past Surgical History:  Past Surgical History  Procedure Laterality Date  . Skin graft      at age 50  . Dilation and curettage of uterus      8 total in office and outpatient  . Cesarean section      2014, 1995    Past Obstetrical History:  OB History    Gravida Para Term Preterm AB TAB SAB Ectopic Multiple Living   14 6 6  0 8 0 0 0 0 6      Obstetric Comments   TSVD x 4. Term c-section x 2. ?8 D&Cs      Past Gynecological History: As per HPI. No. history of abnormal pap smears (last pap smear: unknown) No. history of STIs.    Social History:  Social History   Social History   . Marital Status: Single    Spouse Name: N/A  . Number of Children: N/A  . Years of Education: N/A   Occupational History  . Not on file.   Social History Main Topics  . Smoking status: Current Every Day Smoker  . Smokeless tobacco: Not on file  . Alcohol Use: No  . Drug Use: No  . Sexual Activity: Not on file   Other Topics Concern  . Not on file   Social History Narrative    Family History:  Family History  Problem Relation Age of Onset  . Asthma Mother   . Hypertension Mother   . Asthma Brother    She denies any female cancers, bleeding or blood clotting disorders.   Medications Ms. Westbrook had no medications administered during this visit. Current Outpatient Prescriptions  Medication Sig Dispense Refill  . Multiple Vitamins-Minerals (MULTIVITAMIN WITH MINERALS) tablet Take 1 tablet by mouth daily.     No current facility-administered medications for this visit.    Allergies Review of patient's allergies indicates no known allergies.   Physical Exam:  BP 141/94 mmHg  Pulse 89  Ht 5\' 6"  (1.676 m)  Wt 215 lb (97.523 kg)  BMI 34.72 kg/m2  LMP 05/30/2016 (Exact Date) Body mass index is 34.72 kg/(m^2). General appearance: Well nourished, well developed female in no acute distress.  Cardiovascular: normal s1 and s2.  No murmurs, rubs or gallops. Respiratory:  Clear to auscultation bilateral. Normal respiratory effort Abdomen: positive bowel sounds and no masses, hernias; diffusely non tender to palpation, non distended Neuro/Psych:  Normal mood and affect.  Skin:  Warm and dry.  Lymphatic:  No inguinal lymphadenopathy.   Pelvic exam: is not limited by body habitus EGBUS: within normal limits Vagina: within normal limits and with no blood in the vault, Cervix:  no lesions or cervical motion tenderness Uterus:  nonenlarged and approximately 8 week sized Adnexa:  normal adnexa and no mass, fullness, tenderness Rectovaginal: deferred  Laboratory: UPT  negative  Radiology: none  Assessment: AUB, patient stable  Plan:  See procedure note for pap and embx. Patient would like STI testing. Pelvic u/s ordered for early part of cycle to reduce incidental cysts.   RTC after u/s  Durene Romans MD Attending Center for [facility])

## 2016-06-21 NOTE — Procedures (Deleted)
Endometrial Biopsy Procedure Note  Pre-operative Diagnosis: AUB  Post-operative Diagnosis: same  Procedure Details  Urine pregnancy test was done and result was negative, LMP 6/28.  The risks (including infection, bleeding, pain, and uterine perforation) and benefits of the procedure were explained to the patient and Written informed consent was obtained.  The patient was placed in the dorsal lithotomy position.  Bimanual exam showed the uterus to be in the neutral position.  A Graves' speculum inserted in the vagina, and the cervix was visualized and a pap smear performed. The cervix was then prepped with povidone iodine, and a sharp tenaculum was applied to the anterior lip of the cervix for stabilization.  A pipelle was inserted into the uterine cavity and sounded the uterus to a depth of 9.5cm.  A Large amount of tissue was collected after 1 passes. The sample was sent for pathologic examination.  Condition: Stable  Complications: None  Plan: The patient was advised to call for any fever or for prolonged or severe pain or bleeding. She was advised to use OTC analgesics as needed for mild to moderate pain. She was advised to avoid vaginal intercourse for 48 hours or until the bleeding has completely stopped.  Durene Romans MD Attending Center for [employer])

## 2016-06-22 ENCOUNTER — Encounter: Payer: Self-pay | Admitting: Obstetrics and Gynecology

## 2016-06-22 LAB — HIV ANTIBODY (ROUTINE TESTING W REFLEX): HIV: NONREACTIVE

## 2016-06-22 LAB — HEPATITIS B SURFACE ANTIGEN: Hepatitis B Surface Ag: NEGATIVE

## 2016-06-22 LAB — RPR

## 2016-06-22 LAB — HEPATITIS C ANTIBODY: HCV Ab: NEGATIVE

## 2016-06-22 NOTE — Procedures (Signed)
Endometrial Biopsy Procedure Note  Pre-operative Diagnosis: AUB  Post-operative Diagnosis: same  Procedure Details  Urine pregnancy test was done and result was negative.  The risks (including infection, bleeding, pain, and uterine perforation) and benefits of the procedure were explained to the patient and Written informed consent was obtained.  The patient was placed in the dorsal lithotomy position.  Bimanual exam showed the uterus to be in the neutral position.  A Graves' speculum inserted in the vagina, and the cervix was visualized and a pap smear performed. The cervix was then prepped with povidone iodine, and a sharp tenaculum was applied to the anterior lip of the cervix for stabilization.  A pipelle was inserted into the uterine cavity and sounded the uterus to a depth of 9.5cm.  A Moderate amount of tissue was collected after 2 passes. The sample was sent for pathologic examination.  Condition: Stable  Complications: None  Plan: The patient was advised to call for any fever or for prolonged or severe pain or bleeding. She was advised to use OTC analgesics as needed for mild to moderate pain. She was advised to avoid vaginal intercourse for 48 hours or until the bleeding has completely stopped.  Durene Romans MD Attending Center for [employer] [facility])

## 2016-06-25 LAB — CYTOLOGY - PAP

## 2016-06-25 LAB — GC/CHLAMYDIA PROBE AMP (~~LOC~~) NOT AT ARMC
CHLAMYDIA, DNA PROBE: NEGATIVE
Neisseria Gonorrhea: NEGATIVE

## 2016-07-04 ENCOUNTER — Telehealth: Payer: Self-pay | Admitting: *Deleted

## 2016-07-04 NOTE — Telephone Encounter (Signed)
Received message left on nurse voicemail on 07/04/16 at 0847.  Tillie Rung from [facility] calling to get pre-authorization for U/S scheduled for 07/05/16.  Requests a return call to [phone].

## 2016-07-05 ENCOUNTER — Ambulatory Visit (INDEPENDENT_AMBULATORY_CARE_PROVIDER_SITE_OTHER): Payer: Medicaid Other | Admitting: Obstetrics and Gynecology

## 2016-07-05 ENCOUNTER — Ambulatory Visit (HOSPITAL_COMMUNITY)
Admission: RE | Admit: 2016-07-05 | Discharge: 2016-07-05 | Disposition: A | Discharge status: Home / Self Care | Payer: Medicaid Other | Source: Ambulatory Visit | Attending: Obstetrics and Gynecology | Admitting: Obstetrics and Gynecology

## 2016-07-05 ENCOUNTER — Encounter: Payer: Self-pay | Admitting: Obstetrics and Gynecology

## 2016-07-05 ENCOUNTER — Telehealth: Payer: Self-pay | Admitting: *Deleted

## 2016-07-05 VITALS — BP 144/90 | HR 72 | Wt 214.2 lb

## 2016-07-05 DIAGNOSIS — Z1239 Encounter for other screening for malignant neoplasm of breast: Secondary | ICD-10-CM | POA: Diagnosis present

## 2016-07-05 DIAGNOSIS — D259 Leiomyoma of uterus, unspecified: Secondary | ICD-10-CM | POA: Insufficient documentation

## 2016-07-05 DIAGNOSIS — N939 Abnormal uterine and vaginal bleeding, unspecified: Secondary | ICD-10-CM

## 2016-07-05 DIAGNOSIS — I10 Essential (primary) hypertension: Secondary | ICD-10-CM | POA: Diagnosis not present

## 2016-07-05 MED ORDER — CONDOMS - FEMALE MISC: 1.0000 [IU] | 3 refills | Status: DC | PRN

## 2016-07-05 NOTE — Telephone Encounter (Signed)
Patient left clinic before being scheduled for mammogram. Called patient, call went to voice mail. Message left stating I have schedule her mammogram for [facility], Aug 10 @ 1240. If she has any questions please call back at the clinics

## 2016-07-05 NOTE — Telephone Encounter (Signed)
Medicaid Auth obtained.

## 2016-07-05 NOTE — Progress Notes (Signed)
Obstetrics and Gynecology Visit Return Patient Evaluation  Appointment Date: 07/05/2016  Primary Care Provider: Maren Reamer  Referring Provider: Maren Reamer, MD  Chief Complaint: ultrasound follow up  History of Present Illness and Interval History:  See prior notes but pt initially seen on 7/20 for AUB. Patient states that her issue is more dysmenorrhea and menorrhagia for the first day or two and not irregular bleeding. Pap, hpv, embx and STI labs all negative from last visit  U/s today negative  Review of Systems: her 12 point review of systems is negative or as noted in the History of Present Illness.  The following portions of the patient's history were reviewed and updated as appropriate: allergies, current medications, past family history, past medical history, past social history, past surgical history and problem list.  Patient Active Problem List   Diagnosis Date Noted  . Prediabetes 05/02/2016   Medications: MVI Allergies: has No Known Allergies.  Physical Exam:  BP (!) 144/90 (BP Location: Right Arm, Patient Position: Sitting, Cuff Size: Normal)   Pulse 72   Wt 214 lb 3.2 oz (97.2 kg)   LMP 07/01/2016   BMI 34.57 kg/m  Body mass index is 34.57 kg/m. General appearance: Well nourished, well developed female in no acute distress.  Neuro/Psych:  Normal mood and affect.    Pelvic exam:  deferred   Assessment: pt stable  Plan: D/w pt expectant management such as NSAIDs 1-2d before and during menses, hormone options with Mirena the best option and endometrial ablation. I told her that I'd recommend NSAIDs or mirena at this time if she desired any intervention but doing an ablation to start with would be fine if that's preferred by her, but given no impact on ADLs I wouldn't recommend it to start. Pt elects to do NSAIDs and pt told if she changes her mind or has any questions to let us know.  Pt denies h/o HTN but did have gestational HTN. I told her to  keep an eye on it and let her PCP know; I'll forward her my note from today.   Screening mammo scheduled for 40 y/o  Aletha Halim, Brooke Bonito MD Attending Center for [employer])

## 2016-07-12 ENCOUNTER — Ambulatory Visit: Payer: Medicaid Other

## 2017-01-22 ENCOUNTER — Ambulatory Visit: Payer: Medicaid Other | Attending: Family Medicine | Admitting: Family Medicine

## 2017-01-22 ENCOUNTER — Encounter: Payer: Self-pay | Admitting: Family Medicine

## 2017-01-22 VITALS — BP 122/78 | HR 92 | Temp 98.3°F | Ht 66.5 in | Wt 222.0 lb

## 2017-01-22 DIAGNOSIS — K219 Gastro-esophageal reflux disease without esophagitis: Secondary | ICD-10-CM | POA: Insufficient documentation

## 2017-01-22 DIAGNOSIS — I10 Essential (primary) hypertension: Secondary | ICD-10-CM | POA: Insufficient documentation

## 2017-01-22 DIAGNOSIS — R7303 Prediabetes: Secondary | ICD-10-CM | POA: Diagnosis present

## 2017-01-22 DIAGNOSIS — H1033 Unspecified acute conjunctivitis, bilateral: Secondary | ICD-10-CM | POA: Insufficient documentation

## 2017-01-22 MED ORDER — POLYMYXIN B-TRIMETHOPRIM 10000-0.1 UNIT/ML-% OP SOLN: 1.0000 [drp] | OPHTHALMIC | 0 refills | Status: DC

## 2017-01-22 MED ORDER — OMEPRAZOLE 20 MG PO CPDR: 20.0000 mg | DELAYED_RELEASE_CAPSULE | Freq: Every day | ORAL | 3 refills | Status: DC

## 2017-01-22 NOTE — Progress Notes (Signed)
   Subjective:    Patient ID: Sabrina Larson, female    DOB: 08-15-1976, 41 y.o.   MRN: [id number]  HPI 41 year old [place] American female with a past medical history significant for HTN and prediabetes. Presents today with a complain of Pink eye. Symptom started 4 days ago and is associated with yellowish eye drainage, itching and crusty eyes on waking from sleep. She endorsed some burning in the corner of her eyes but eye pain. She denies sneezing, headache, ear pain, sinus pressure or pain. Denies any environmental allergy. Denies fever or chills. Denies blurry vision, photophobia or change in vision. She report her son recently had the same symptom. She endorsed having night time cough- which she said has been going on for some time, she think is related to her acid reflux. She said she has been out of her acid reflux medication for awhile and would like it to be refilled.  Review of Systems  Constitutional: Negative for chills and fever.  HENT: Negative for congestion and sneezing.   Eyes: Positive for discharge, redness and itching. Negative for photophobia, pain and visual disturbance.  Respiratory: Positive for cough. Negative for chest tightness, shortness of breath and wheezing.   Cardiovascular: Negative for chest pain, palpitations and leg swelling.      Objective: BP 122/78 (BP Location: Right Arm, Patient Position: Sitting, Cuff Size: Small)   Pulse 92   Temp 98.3 F (36.8 C) (Oral)   Ht 5' 6.5" (1.689 m)   Wt 222 lb (100.7 kg)   SpO2 97%   BMI 35.29 kg/m     Physical Exam  Constitutional: She appears well-developed and well-nourished. No distress.  HENT:  Head: Normocephalic and atraumatic.  Right Ear: External ear normal.  Left Ear: External ear normal.  Mouth/Throat: Oropharynx is clear and moist. No oropharyngeal exudate.  Eyes: EOM are normal. Pupils are equal, round, and reactive to light. Right eye exhibits no discharge. Left eye exhibits no discharge. No  scleral icterus.   light pink conjuctiva  Neck: Normal range of motion. Neck supple. No thyromegaly present.  Cardiovascular: Normal rate, regular rhythm and normal heart sounds.   No murmur heard. Pulmonary/Chest: Effort normal and breath sounds normal. She has no wheezes.  Lymphadenopathy:    She has no cervical adenopathy.  Skin: Skin is warm and dry.      Assessment & Plan:  1. Gastroesophageal reflux disease without esophagitis Patient reports taking Prilosec for symptoms, will reorder med. Avoid spicy foods Do not eat 2 hours prior to bedtime  2. Acute conjunctivitis of both eyes, unspecified acute conjunctivitis type In absence of any red flag symptom, and household member having similar symptom, will order antibiotic eye drops.  Meds ordered this encounter  Medications  . omeprazole (PRILOSEC) 20 MG capsule    Sig: Take 1 capsule (20 mg total) by mouth daily.    Dispense:  30 capsule    Refill:  3  . trimethoprim-polymyxin b (POLYTRIM) ophthalmic solution    Sig: Place 1 drop into both eyes every 4 (four) hours.    Dispense:  10 mL    Refill:  0   .RTO if symptom persist or does not get better in 24 hrs after starting the eye drops.  Jari Favre, RN, BSN, AGNP-Student

## 2017-02-25 ENCOUNTER — Ambulatory Visit: Payer: Medicaid Other | Attending: Internal Medicine | Admitting: Internal Medicine

## 2017-02-25 ENCOUNTER — Encounter: Payer: Self-pay | Admitting: Internal Medicine

## 2017-02-25 VITALS — BP 132/86 | HR 98 | Temp 98.1°F | Resp 16 | Wt 218.6 lb

## 2017-02-25 DIAGNOSIS — I10 Essential (primary) hypertension: Secondary | ICD-10-CM | POA: Insufficient documentation

## 2017-02-25 DIAGNOSIS — Z23 Encounter for immunization: Secondary | ICD-10-CM | POA: Insufficient documentation

## 2017-02-25 DIAGNOSIS — K219 Gastro-esophageal reflux disease without esophagitis: Secondary | ICD-10-CM | POA: Insufficient documentation

## 2017-02-25 DIAGNOSIS — Z79899 Other long term (current) drug therapy: Secondary | ICD-10-CM | POA: Insufficient documentation

## 2017-02-25 DIAGNOSIS — R21 Rash and other nonspecific skin eruption: Secondary | ICD-10-CM

## 2017-02-25 DIAGNOSIS — E669 Obesity, unspecified: Secondary | ICD-10-CM | POA: Insufficient documentation

## 2017-02-25 DIAGNOSIS — R7303 Prediabetes: Secondary | ICD-10-CM | POA: Insufficient documentation

## 2017-02-25 DIAGNOSIS — Z0000 Encounter for general adult medical examination without abnormal findings: Secondary | ICD-10-CM | POA: Diagnosis present

## 2017-02-25 DIAGNOSIS — F17210 Nicotine dependence, cigarettes, uncomplicated: Secondary | ICD-10-CM | POA: Diagnosis not present

## 2017-02-25 DIAGNOSIS — Z720 Tobacco use: Secondary | ICD-10-CM

## 2017-02-25 DIAGNOSIS — Z7984 Long term (current) use of oral hypoglycemic drugs: Secondary | ICD-10-CM | POA: Insufficient documentation

## 2017-02-25 DIAGNOSIS — D259 Leiomyoma of uterus, unspecified: Secondary | ICD-10-CM | POA: Insufficient documentation

## 2017-02-25 MED ORDER — TRIAMCINOLONE ACETONIDE 0.5 % EX OINT: 1.0000 "application " | TOPICAL_OINTMENT | Freq: Two times a day (BID) | CUTANEOUS | 0 refills | Status: DC

## 2017-02-25 MED ORDER — PREDNISONE 20 MG PO TABS: 60.0000 mg | ORAL_TABLET | Freq: Every day | ORAL | 0 refills | Status: DC

## 2017-02-25 MED ORDER — METFORMIN HCL 500 MG PO TABS: 500.0000 mg | ORAL_TABLET | Freq: Two times a day (BID) | ORAL | 3 refills | Status: DC

## 2017-02-25 MED ORDER — CETIRIZINE HCL 10 MG PO TABS: 10.0000 mg | ORAL_TABLET | Freq: Every day | ORAL | 11 refills | Status: DC

## 2017-02-25 MED ORDER — FAMOTIDINE 20 MG PO TABS: 20.0000 mg | ORAL_TABLET | Freq: Two times a day (BID) | ORAL | 3 refills | Status: DC

## 2017-02-25 NOTE — Patient Instructions (Addendum)
[facility] bldg info/   -  For your tobacco abuse: Strongly recommend that he stop smoking back and all other inhaled products right away Call 1-800-Quit-Now to get free nicotine replacement from the state of [place]  - -  Steps to Quit Smoking Smoking tobacco can be bad for your health. It can also affect almost every organ in your body. Smoking puts you and people around you at risk for many serious long-lasting (chronic) diseases. Quitting smoking is hard, but it is one of the best things that you can do for your health. It is never too late to quit. What are the benefits of quitting smoking? When you quit smoking, you lower your risk for getting serious diseases and conditions. They can include:  Lung cancer or lung disease.  Heart disease.  Stroke.  Heart attack.  Not being able to have children (infertility).  Weak bones (osteoporosis) and broken bones (fractures). If you have coughing, wheezing, and shortness of breath, those symptoms may get better when you quit. You may also get sick less often. If you are pregnant, quitting smoking can help to lower your chances of having a baby of low birth weight. What can I do to help me quit smoking? Talk with your doctor about what can help you quit smoking. Some things you can do (strategies) include:  Quitting smoking totally, instead of slowly cutting back how much you smoke over a period of time.  Going to in-person counseling. You are more likely to quit if you go to many counseling sessions.  Using resources and support systems, such as:  Online chats with a Social worker.  Phone quitlines.  Printed Furniture conservator/restorer.  Support groups or group counseling.  Text messaging programs.  Mobile phone apps or applications.  Taking medicines. Some of these medicines may have nicotine in them. If you are pregnant or breastfeeding, do not take any medicines to quit smoking unless your doctor says it is okay. Talk with your  doctor about counseling or other things that can help you. Talk with your doctor about using more than one strategy at the same time, such as taking medicines while you are also going to in-person counseling. This can help make quitting easier. What things can I do to make it easier to quit? Quitting smoking might feel very hard at first, but there is a lot that you can do to make it easier. Take these steps:  Talk to your family and friends. Ask them to support and encourage you.  Call phone quitlines, reach out to support groups, or work with a Social worker.  Ask people who smoke to not smoke around you.  Avoid places that make you want (trigger) to smoke, such as:  Bars.  Parties.  Smoke-break areas at work.  Spend time with people who do not smoke.  Lower the stress in your life. Stress can make you want to smoke. Try these things to help your stress:  Getting regular exercise.  Deep-breathing exercises.  Yoga.  Meditating.  Doing a body scan. To do this, close your eyes, focus on one area of your body at a time from head to toe, and notice which parts of your body are tense. Try to relax the muscles in those areas.  Download or buy apps on your mobile phone or tablet that can help you stick to your quit plan. There are many free apps, such as QuitGuide from the [employer] Office manager for Disease Control and Prevention). You can find more  support from smokefree.gov and other websites. This information is not intended to replace advice given to you by your health care provider. Make sure you discuss any questions you have with your health care provider. Document Released: 09/15/2009 Document Revised: 07/17/2016 Document Reviewed: 04/05/2015 Elsevier Interactive Patient Education  2017 [place] START PATIENT GUIDE TO LCHF/IF LOW CARB HIGH FAT / INTERMITTENT FASTING  Recommend: <50 gram carbohydrate a day for weightloss.  What is this diet and how does it  work? o Insulin is a hormone made by your body that allows you to use sugar (glucose) from carbohydrates in the food you eat for energy or to store glucose (as fat) for future use  o Insulin levels need to be lowered in order to utilize our stored energy (fat) o Many struggling with obesity are insulin resistant and have high levels of insulin o This diet works to lower your insulin in two ways o Fasting - allows your insulin levels to naturally decrease  o Avoiding carbohydrates - carbs trigger increase in insulin Low Carb Healthy Fat (LCHF) o Get a free app for your phone, such as MyFitnessPal, to help you track your macronutrients (carbs/protein/fats) and to track your weight and body measurements to see your progress o Set your goal for around 10% carbs/20% protein/70% fat o A good starting goal for amount of net carbs per day is 50 grams (some will aim for 20 grams) o "Net carbs" refers to total grams of carbs minus grams of fiber (as fiber is not typically absorbed). For example, if a food has 5g total carb and 3g fiber, that would be 2g net carbs o Increase healthy fats - eg. olive oil, eggs, nuts, avocado, cheese, butter, coconut, meats, fish o Avoid high carb foods - eg. bread, pasta, potatoes, rice, cookies, soda, juice, anything sugary o Buy full-fat ingredients (avoid low-fat versions, which often have more sugar) o No need to count calories, but pay close attention to grams of carbs on labels Intermittent Fasting (IF) o "Fasting" is going a period of time without eating - it helps to stay busy and well-hydrated o Purpose of fasting is to allow insulin levels to drop as low as possible, allowing your body to switch into fat-burning mode o With this diet there are many approaches to fasting, but 16:8 and 24hr fasts are commonly used o 16:8 fast, usually 5-7 days a week - Fasting for 16 hours of the day, then eating all meals for the day over course of 8 hours. o 24 hour fast, usually  1-3 days a week - Typically eating one meal a day, then fasting until the next day. Plenty of fluids (and some salt to help you hold onto fluids) are recommended during longer fasts.  o During fasts certain beverages are still acceptable - water, sparkling water, bone broth, black tea or coffee, or tea/coffee with small amount of heavy whipping cream Special note for those on diabetic medications o Discuss your medications with your physician. You may need to hold your medication or adjust to only taking when eating. Diabetics should keep close track of their blood sugars when making any changes to diet/meds, to ensure they are staying within normal limits For more info about LCHF/IF o Watch "Therapeutic Fasting - Solving the Two-Compartment Problem" video by Dr. Sharman Cheek on YouTube (GreatestGyms.com.ee) for a great intro to these concepts o Read "The Obesity Code" and/or "The Complete Guide to Fasting" by Dr. Sharman Cheek o Go  to www.dietdoctor.com for explanations, recipes, and infographics about foods to eat/avoid o Get a Free smartphone app that helps count carbohydrates  - ie MyKeto EXAMPLES TO GET STARTED Fasting Beverages -water (can add  tsp [place] salt once or twice a day to help stay hydrated for longer fasts) -Sparkling water (such as [employer] or similar; avoid any with artificial sweeteners)  -Bone broth (multiple recipes available online or can buy pre-made) -Tea or Coffee (Adding heavy whipping cream or coconut oil to your tea or coffee can be helpful if you find yourself getting too hungry during the fasts. Can also add cinnamon for flavor. Or "bulletproof coffee.") Low Carb Healthy Fat Breakfast (if not fasting) -eggs in butter or olive oil with avocado -omelet with veggies and cheese  Lunch -hamburger with cheese and avocado wrapped in lettuce (no bun, no ketchup) -meat and cheese wrapped in lettuce (can dip in mustard or olive  oil/vinegar/mayo) -salad with meat/cheese/nuts and higher fat dressing (vinaigrette or Ranch, etc) -tuna salad lettuce wrap -taco meat with cheese, sour cream, guacamole, cheese over lettuce  Dinner -steak with herb butter or [place] sauce -"[place]" pizza (uses cheese and almond flour for the dough - several recipes available online) -roasted or grilled chicken with skin on, with low carb sauce (buffalo, garlic butter, alfredo, pesto, etc) -baked salmon with lemon butter -chicken alfredo with zucchini noodles -[place] butter chicken with low carb garlic naan -egg roll in a bowl  Side Dishes -mashed cauliflower (homemade or available in freezer section) -roast vegetables (green veggies that grow above ground rather than root veggies) with butter or cheese -Caprese salad (fresh mozzarella, tomato and basil with olive oil) -homemade low-carb coleslaw Snacks/Desserts (try to avoid unnecessary snacking and sweets in general) -celery or cucumber dipped in guacamole or sour cream dip -cheese and meat slices  -raspberries with whipped cream (can make homemade with no sugar added) -low carb [place] butter cake  AVOID - sugar, diet/regular soda, potatoes, breads, rice, pasta, candy, cookies, cakes, muffins, juice, high carb fruit (bananas, grapes), beer, ketchup, barbeque and other sweet sauces  -   Prediabetes Eating Plan Prediabetes-also called impaired glucose tolerance or impaired fasting glucose-is a condition that causes blood sugar (blood glucose) levels to be higher than normal. Following a healthy diet can help to keep prediabetes under control. It can also help to lower the risk of type 2 diabetes and heart disease, which are increased in people who have prediabetes. Along with regular exercise, a healthy diet:  Promotes weight loss.  Helps to control blood sugar levels.  Helps to improve the way that the body uses insulin. What do I need to know about this eating plan?  Use  the glycemic index (GI) to plan your meals. The index tells you how quickly a food will raise your blood sugar. Choose low-GI foods. These foods take a longer time to raise blood sugar.  Pay close attention to the amount of carbohydrates in the food that you eat. Carbohydrates increase blood sugar levels.  Keep track of how many calories you take in. Eating the right amount of calories will help you to achieve a healthy weight. Losing about 7 percent of your starting weight can help to prevent type 2 diabetes.  You may want to follow a Mediterranean diet. This diet includes a lot of vegetables, lean meats or fish, whole grains, fruits, and healthy oils and fats. What foods can I eat? Grains  Whole grains, such as whole-wheat or whole-grain breads,  crackers, cereals, and pasta. Unsweetened oatmeal. Bulgur. Barley. Quinoa. Brown rice. Corn or whole-wheat flour tortillas or taco shells. Vegetables  Lettuce. Spinach. Peas. Beets. Cauliflower. Cabbage. Broccoli. Carrots. Tomatoes. Squash. Eggplant. Herbs. Peppers. Onions. Cucumbers. Brussels sprouts. Fruits  Berries. Bananas. Apples. Oranges. Grapes. Papaya. Mango. Pomegranate. Kiwi. Grapefruit. Cherries. Meats and Other Protein Sources  Seafood. Lean meats, such as chicken and [place] or lean cuts of pork and beef. Tofu. Eggs. Nuts. Beans. Dairy  Low-fat or fat-free dairy products, such as yogurt, cottage cheese, and cheese. Beverages  Water. Tea. Coffee. Sugar-free or diet soda. Seltzer water. Milk. Milk alternatives, such as soy or almond milk. Condiments  Mustard. Relish. Low-fat, low-sugar ketchup. Low-fat, low-sugar barbecue sauce. Low-fat or fat-free mayonnaise. Sweets and Desserts  Sugar-free or low-fat pudding. Sugar-free or low-fat ice cream and other frozen treats. Fats and Oils  Avocado. Walnuts. Olive oil. The items listed above may not be a complete list of recommended foods or beverages. Contact your dietitian for more options.   What foods are not recommended? Grains  Refined white flour and flour products, such as bread, pasta, snack foods, and cereals. Beverages  Sweetened drinks, such as sweet iced tea and soda. Sweets and Desserts  Baked goods, such as cake, cupcakes, pastries, cookies, and cheesecake. The items listed above may not be a complete list of foods and beverages to avoid. Contact your dietitian for more information.  This information is not intended to replace advice given to you by your health care provider. Make sure you discuss any questions you have with your health care provider. Document Released: 04/05/2015 Document Revised: 04/26/2016 Document Reviewed: 12/15/2014 Elsevier Interactive Patient Education  2017 [place] for Gastroesophageal Reflux Disease, Adult When you have gastroesophageal reflux disease (GERD), the foods you eat and your eating habits are very important. Choosing the right foods can help ease your discomfort. What guidelines do I need to follow?  Choose fruits, vegetables, whole grains, and low-fat dairy products.  Choose low-fat meat, fish, and poultry.  Limit fats such as oils, salad dressings, butter, nuts, and avocado.  Keep a food diary. This helps you identify foods that cause symptoms.  Avoid foods that cause symptoms. These may be different for everyone.  Eat small meals often instead of 3 large meals a day.  Eat your meals slowly, in a place where you are relaxed.  Limit fried foods.  Cook foods using methods other than frying.  Avoid drinking alcohol.  Avoid drinking large amounts of liquids with your meals.  Avoid bending over or lying down until 2-3 hours after eating. What foods are not recommended? These are some foods and drinks that may make your symptoms worse: Vegetables  Tomatoes. Tomato juice. Tomato and spaghetti sauce. Chili peppers. Onion and garlic. Horseradish. Fruits  Oranges, grapefruit, and lemon  (fruit and juice). Meats  High-fat meats, fish, and poultry. This includes hot dogs, ribs, ham, sausage, salami, and bacon. Dairy  Whole milk and chocolate milk. Sour cream. Cream. Butter. Ice cream. Cream cheese. Drinks  Coffee and tea. Bubbly (carbonated) drinks or energy drinks. Condiments  Hot sauce. Barbecue sauce. Sweets/Desserts  Chocolate and cocoa. Donuts. Peppermint and spearmint. Fats and Oils  High-fat foods. This includes [place] fries and potato chips. Other  Vinegar. Strong spices. This includes black pepper, white pepper, red pepper, cayenne, curry powder, cloves, ginger, and chili powder. The items listed above may not be a complete list of foods and drinks to avoid. Contact  your dietitian for more information.  This information is not intended to replace advice given to you by your health care provider. Make sure you discuss any questions you have with your health care provider. Document Released: 05/20/2012 Document Revised: 04/26/2016 Document Reviewed: 09/23/2013 Elsevier Interactive Patient Education  2017 [place]. Pneumococcal Polysaccharide Vaccine: What You Need to Know 1. Why get vaccinated? Vaccination can protect older adults (and some children and younger adults) from pneumococcal disease. Pneumococcal disease is caused by bacteria that can spread from person to person through close contact. It can cause ear infections, and it can also lead to more serious infections of the:  Lungs (pneumonia),  Blood (bacteremia), and  Covering of the brain and spinal cord (meningitis). Meningitis can cause deafness and brain damage, and it can be fatal. Anyone can get pneumococcal disease, but children under 59 years of age, people with certain medical conditions, adults over 57 years of age, and cigarette smokers are at the highest risk. About 18,000 older adults die each year from pneumococcal disease in the [place]. Treatment of pneumococcal infections with  penicillin and other drugs used to be more effective. But some strains of the disease have become resistant to these drugs. This makes prevention of the disease, through vaccination, even more important. 2. Pneumococcal polysaccharide vaccine (PPSV23) Pneumococcal polysaccharide vaccine (PPSV23) protects against 23 types of pneumococcal bacteria. It will not prevent all pneumococcal disease. PPSV23 is recommended for:  All adults 24 years of age and older,  Anyone 2 through 41 years of age with certain long-term health problems,  Anyone 2 through 41 years of age with a weakened immune system,  Adults 30 through 41 years of age who smoke cigarettes or have asthma. Most people need only one dose of PPSV. A second dose is recommended for certain high-risk groups. People 74 and older should get a dose even if they have gotten one or more doses of the vaccine before they turned 65. Your healthcare provider can give you more information about these recommendations. Most healthy adults develop protection within 2 to 3 weeks of getting the shot. 3. Some people should not get this vaccine  Anyone who has had a life-threatening allergic reaction to PPSV should not get another dose.  Anyone who has a severe allergy to any component of PPSV should not receive it. Tell your provider if you have any severe allergies.  Anyone who is moderately or severely ill when the shot is scheduled may be asked to wait until they recover before getting the vaccine. Someone with a mild illness can usually be vaccinated.  Children less than 79 years of age should not receive this vaccine.  There is no evidence that PPSV is harmful to either a pregnant woman or to her fetus. However, as a precaution, women who need the vaccine should be vaccinated before becoming pregnant, if possible. 4. Risks of a vaccine reaction With any medicine, including vaccines, there is a chance of side effects. These are usually mild and go  away on their own, but serious reactions are also possible. About half of people who get PPSV have mild side effects, such as redness or pain where the shot is given, which go away within about two days. Less than 1 out of 100 people develop a fever, muscle aches, or more severe local reactions. Problems that could happen after any vaccine:   People sometimes faint after a medical procedure, including vaccination. Sitting or lying down for about 15 minutes  can help prevent fainting, and injuries caused by a fall. Tell your doctor if you feel dizzy, or have vision changes or ringing in the ears.  Some people get severe pain in the shoulder and have difficulty moving the arm where a shot was given. This happens very rarely.  Any medication can cause a severe allergic reaction. Such reactions from a vaccine are very rare, estimated at about 1 in a million doses, and would happen within a few minutes to a few hours after the vaccination. As with any medicine, there is a very remote chance of a vaccine causing a serious injury or death. The safety of vaccines is always being monitored. For more information, visit: http://www.aguilar.org/ 5. What if there is a serious reaction? What should I look for?  Look for anything that concerns you, such as signs of a severe allergic reaction, very high fever, or unusual behavior. Signs of a severe allergic reaction can include hives, swelling of the face and throat, difficulty breathing, a fast heartbeat, dizziness, and weakness. These would usually start a few minutes to a few hours after the vaccination. What should I do?  If you think it is a severe allergic reaction or other emergency that can't wait, call 9-1-1 or get to the nearest hospital. Otherwise, call your doctor. Afterward, the reaction should be reported to the Vaccine Adverse Event Reporting System (VAERS). Your doctor might file this report, or you can do it yourself through the VAERS web site  at www.vaers.SamedayNews.es, or by calling 782-301-1246. VAERS does not give medical advice.  6. How can I learn more?  Ask your doctor. He or she can give you the vaccine package insert or suggest other sources of information.  Call your local or state health department.  Contact the Centers for Disease Control and Prevention (CDC):  Call 2015107207 (1-800-CDC-INFO) or  Visit CDC's website at http://hunter.com/ CDC Pneumococcal Polysaccharide Vaccine VIS (03/26/14) This information is not intended to replace advice given to you by your health care provider. Make sure you discuss any questions you have with your health care provider. Document Released: 09/16/2006 Document Revised: 08/09/2016 Document Reviewed: 08/09/2016 Elsevier Interactive Patient Education  2017 [place] (Tetanus and Diphtheria): What You Need to Know 1. Why get vaccinated? Tetanus  and diphtheria are very serious diseases. They are rare in the [place] today, but people who do become infected often have severe complications. Td vaccine is used to protect adolescents and adults from both of these diseases. Both tetanus and diphtheria are infections caused by bacteria. Diphtheria spreads from person to person through coughing or sneezing. Tetanus-causing bacteria enter the body through cuts, scratches, or wounds. TETANUS (lockjaw) causes painful muscle tightening and stiffness, usually all over the body.  It can lead to tightening of muscles in the head and neck so you can't open your mouth, swallow, or sometimes even breathe. Tetanus kills about 1 out of every 10 people who are infected even after receiving the best medical care. DIPHTHERIA can cause a thick coating to form in the back of the throat.  It can lead to breathing problems, paralysis, heart failure, and death. Before vaccines, as many as 200,000 cases of diphtheria and hundreds of cases of tetanus were reported in the [place] each  year. Since vaccination began, reports of cases for both diseases have dropped by about 99%. 2. Td vaccine Td vaccine can protect adolescents and adults from tetanus and diphtheria. Td is usually given as a booster  dose every 10 years but it can also be given earlier after a severe and dirty wound or burn. Another vaccine, called Tdap, which protects against pertussis in addition to tetanus and diphtheria, is sometimes recommended instead of Td vaccine. Your doctor or the person giving you the vaccine can give you more information. Td may safely be given at the same time as other vaccines. 3. Some people should not get this vaccine  A person who has ever had a life-threatening allergic reaction after a previous dose of any tetanus or diphtheria containing vaccine, OR has a severe allergy to any part of this vaccine, should not get Td vaccine. Tell the person giving the vaccine about any severe allergies.  Talk to your doctor if you:  had severe pain or swelling after any vaccine containing diphtheria or tetanus,  ever had a condition called Guillain Barre Syndrome (GBS),  aren't feeling well on the day the shot is scheduled. 4. What are the risks from Td vaccine? With any medicine, including vaccines, there is a chance of side effects. These are usually mild and go away on their own. Serious reactions are also possible but are rare. Most people who get Td vaccine do not have any problems with it. Mild problems following Td vaccine:  (Did not interfere with activities)  Pain where the shot was given (about 8 people in 10)  Redness or swelling where the shot was given (about 1 person in 4)  Mild fever (rare)  Headache (about 1 person in 4)  Tiredness (about 1 person in 4) Moderate problems following Td vaccine:  (Interfered with activities, but did not require medical attention)  Fever over 102F (rare) Severe problems following Td vaccine:  (Unable to perform usual activities;  required medical attention)  Swelling, severe pain, bleeding and/or redness in the arm where the shot was given (rare). Problems that could happen after any vaccine:   People sometimes faint after a medical procedure, including vaccination. Sitting or lying down for about 15 minutes can help prevent fainting, and injuries caused by a fall. Tell your doctor if you feel dizzy, or have vision changes or ringing in the ears.  Some people get severe pain in the shoulder and have difficulty moving the arm where a shot was given. This happens very rarely.  Any medication can cause a severe allergic reaction. Such reactions from a vaccine are very rare, estimated at fewer than 1 in a million doses, and would happen within a few minutes to a few hours after the vaccination. As with any medicine, there is a very remote chance of a vaccine causing a serious injury or death. The safety of vaccines is always being monitored. For more information, visit: http://www.aguilar.org/ 5. What if there is a serious reaction? What should I look for?  Look for anything that concerns you, such as signs of a severe allergic reaction, very high fever, or unusual behavior. Signs of a severe allergic reaction can include hives, swelling of the face and throat, difficulty breathing, a fast heartbeat, dizziness, and weakness. These would usually start a few minutes to a few hours after the vaccination. What should I do?   If you think it is a severe allergic reaction or other emergency that can't wait, call 9-1-1 or get the person to the nearest hospital. Otherwise, call your doctor.  Afterward, the reaction should be reported to the Vaccine Adverse Event Reporting System (VAERS). Your doctor might file this report, or you can do it  yourself through the VAERS web site at www.vaers.SamedayNews.es, or by calling (641)799-2859.  VAERS does not give medical advice. 6. The National Vaccine Injury Compensation Program The [employer]  Vaccine Injury Compensation Program (VICP) is a federal program that was created to compensate people who may have been injured by certain vaccines. Persons who believe they may have been injured by a vaccine can learn about the program and about filing a claim by calling 906-518-4524 or visiting the [place] website at GoldCloset.com.ee. There is a time limit to file a claim for compensation. 7. How can I learn more?  Ask your doctor. He or she can give you the vaccine package insert or suggest other sources of information.  Call your local or state health department.  Contact the Centers for Disease Control and Prevention (CDC):  Call 509 347 2606 (1-800-CDC-INFO)  Visit CDC's website at http://hunter.com/ CDC Td Vaccine VIS (03/13/16) This information is not intended to replace advice given to you by your health care provider. Make sure you discuss any questions you have with your health care provider. Document Released: 09/16/2006 Document Revised: 08/09/2016 Document Reviewed: 08/09/2016 Elsevier Interactive Patient Education  2017 [employer].

## 2017-02-25 NOTE — Progress Notes (Signed)
Sabrina Larson, is a 41 y.o. female  UJW:[id number]  [id number]  DOB - Jan 04, 1976  Chief Complaint  Patient presents with  . Annual Exam        Subjective:   Sabrina Larson is a 42 y.o. female here today for a follow up visit, w/ hx of gerd, obesity, and prediabetes.  Last saw DR Jarold Song 01/30/17 for conjunctivitis. She did not take the eye drops, got better on own, thinks it is due to allergies.  Does get seasonal allergies, and takes zyrtec occasionally.  Of note, she is exercising more, started last few weeks. She noted she developed a pruritic rash around her neck last Friday, seems to be extending. Only think new has been sweating more w/ exercising, she used her dgts scarf last week which had a lot of perfume on it, and a new lotion - which she has subsequently stopped.  She smokes 1/2 pack tob  /day, once  To stop.  She has hx of gerd, and takes ppi at times.   Patient has No headache, No chest pain, No abdominal pain - No Nausea, No new weakness tingling or numbness, No Cough - SOB.  No problems updated.  ALLERGIES: No Known Allergies  PAST MEDICAL HISTORY: Past Medical History:  Diagnosis Date  . GERD (gastroesophageal reflux disease)   . Hypertension   . Prediabetes     MEDICATIONS AT HOME: Prior to Admission medications   Medication Sig Start Date End Date Taking? Authorizing Provider  cetirizine (ZYRTEC) 10 MG tablet Take 1 tablet (10 mg total) by mouth daily. 02/25/17   Maren Reamer, MD  famotidine (PEPCID) 20 MG tablet Take 1 tablet (20 mg total) by mouth 2 (two) times daily. 02/25/17 02/25/18  Maren Reamer, MD  metFORMIN (GLUCOPHAGE) 500 MG tablet Take 1 tablet (500 mg total) by mouth 2 (two) times daily with a meal. 02/25/17   Maren Reamer, MD  Multiple Vitamins-Minerals (MULTIVITAMIN WITH MINERALS) tablet Take 1 tablet by mouth daily.    Historical Provider, MD  predniSONE (DELTASONE) 20 MG tablet Take 3 tablets (60 mg total) by mouth daily  with breakfast. Day 1-3, take 60mg  (=3 tabs) qam, day 4-7 take 40mg  qam (=2 tabs), day 8-10 take 1 tab daily, than stop 02/25/17   Maren Reamer, MD  triamcinolone ointment (KENALOG) 0.5 % Apply 1 application topically 2 (two) times daily. 02/25/17   Maren Reamer, MD     Objective:   Vitals:   02/25/17 1108  BP: 132/86  Pulse: 98  Resp: 16  Temp: 98.1 F (36.7 C)  TempSrc: Oral  SpO2: 95%  Weight: 218 lb 9.6 oz (99.2 kg)    Exam General appearance : Awake, alert, not in any distress. Speech Clear. Not toxic looking, pleasant, obese. HEENT: Atraumatic and Normocephalic, pupils equally reactive to light. Neck: supple, no JVD. No cervical lymphadenopathy.   Chest:Good air entry bilaterally, no added sounds. CVS: S1 S2 regular, no murmurs/gallups or rubs. Abdomen: Bowel sounds active, Non tender and not distended with no gaurding, rigidity or rebound. Extremities: B/L Lower Ext shows no edema, both legs are warm to touch Neurology: Awake alert, and oriented X 3, CN II-XII grossly intact, Non focal Skin: large erythematous, elevated, not pustular, rash around her neck line that extends to below her collar bones.  Data Review Lab Results  Component Value Date   HGBA1C 6.4 (H) 04/17/2016    Depression screen [facility] 2/9 02/25/2017 01/22/2017 06/21/2016  Decreased Interest 0  0 0  Down, Depressed, Hopeless 0 0 0  PHQ - 2 Score 0 0 0  Altered sleeping - - 2  Tired, decreased energy - - 2  Change in appetite - - 2  Feeling bad or failure about yourself  - - 0  Trouble concentrating - - 0  Moving slowly or fidgety/restless - - 0  Suicidal thoughts - - 0  PHQ-9 Score - - 6      Assessment & Plan   1. 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 Prediabetes - dw pt low carb  diet, info on lc/hf/if diet provided - encouraged increase exercise - pt amendable to starting metformin 500bid, Metformin helps your body process sugars better, also helps w/ some weight loss as well.   But can cause some indigestion/n/diarrhea, but sx usually resolved in 2 wks or so. - CBC with Differential - Basic metabolic panel - Hemoglobin A1c (last was 6.4), she had recent lab work noted to be 6.2 outside facility  3. tob abuse - total cessation recd For your tobacco abuse: Strongly recommend that he stop smoking back and all other inhaled products right away Call 1-800-Quit-Now to get free nicotine replacement from the state of [place]   4. gerd - doing ok, only takes ppi intermittently, will dc ppi., dw pt ckd risk w/ long term use of ppi -trial pepcid 20bid, tums prn good calcium sources as well  5. Pt w/ hx of fibroids, on druug trial w/ Vilaprisan currently.  6. tdap and pneumoccocal 23v today.   Patient have been counseled extensively about nutrition and exercise  Return in about 3 months (around 05/28/2017), or call if rash not better in 2 wks for f/u.  The patient was given clear instructions to go to ER or return to medical center if symptoms don't improve, worsen or new problems develop. The patient verbalized understanding. The patient was told to call to get lab results if they haven't heard anything in the next week.   This note has been created with Surveyor, quantity. Any transcriptional errors are unintentional.   Maren Reamer, MD, [facility] [place], [place]   02/25/2017, 12:07 PM

## 2017-02-26 LAB — BASIC METABOLIC PANEL
BUN / CREAT RATIO: 22 (ref 9–23)
BUN: 14 mg/dL (ref 6–24)
CHLORIDE: 101 mmol/L (ref 96–106)
CO2: 26 mmol/L (ref 18–29)
Calcium: 9.9 mg/dL (ref 8.7–10.2)
Creatinine, Ser: 0.64 mg/dL (ref 0.57–1.00)
GFR calc non Af Amer: 112 mL/min/{1.73_m2} (ref >59)
GFR, EST AFRICAN AMERICAN: 129 mL/min/{1.73_m2} (ref >59)
GLUCOSE: 87 mg/dL (ref 65–99)
POTASSIUM: 4.1 mmol/L (ref 3.5–5.2)
SODIUM: 141 mmol/L (ref 134–144)

## 2017-02-26 LAB — HEMOGLOBIN A1C
Est. average glucose Bld gHb Est-mCnc: 137 mg/dL
HEMOGLOBIN A1C: 6.4 % — AB (ref 4.8–5.6)

## 2017-02-26 LAB — CBC WITH DIFFERENTIAL/PLATELET
BASOS ABS: 0 10*3/uL (ref 0.0–0.2)
Basos: 0 %
EOS (ABSOLUTE): 0.2 10*3/uL (ref 0.0–0.4)
Eos: 3 %
Hematocrit: 40.4 % (ref 34.0–46.6)
Hemoglobin: 13.3 g/dL (ref 11.1–15.9)
IMMATURE GRANS (ABS): 0 10*3/uL (ref 0.0–0.1)
Immature Granulocytes: 0 %
LYMPHS: 29 %
Lymphocytes Absolute: 2.1 10*3/uL (ref 0.7–3.1)
MCH: 25.7 pg — AB (ref 26.6–33.0)
MCHC: 32.9 g/dL (ref 31.5–35.7)
MCV: 78 fL — AB (ref 79–97)
MONOS ABS: 0.6 10*3/uL (ref 0.1–0.9)
Monocytes: 8 %
NEUTROS ABS: 4.3 10*3/uL (ref 1.4–7.0)
NEUTROS PCT: 60 %
PLATELETS: 406 10*3/uL — AB (ref 150–379)
RBC: 5.18 x10E6/uL (ref 3.77–5.28)
RDW: 15.5 % — AB (ref 12.3–15.4)
WBC: 7.3 10*3/uL (ref 3.4–10.8)

## 2017-02-28 ENCOUNTER — Telehealth: Payer: Self-pay

## 2017-02-28 NOTE — Telephone Encounter (Signed)
Contacted pt to go over lab results pt is aware of results pt states request a results be sent to her in the mail.

## 2017-04-18 ENCOUNTER — Encounter: Payer: Self-pay | Admitting: Internal Medicine

## 2017-04-19 ENCOUNTER — Encounter: Payer: Self-pay | Admitting: Internal Medicine

## 2017-04-22 ENCOUNTER — Encounter: Payer: Self-pay | Admitting: Internal Medicine

## 2017-07-12 ENCOUNTER — Other Ambulatory Visit: Payer: Self-pay | Admitting: Internal Medicine

## 2017-08-07 ENCOUNTER — Ambulatory Visit: Payer: Self-pay | Admitting: Family Medicine

## 2017-08-14 ENCOUNTER — Ambulatory Visit: Payer: Medicaid Other | Attending: Internal Medicine | Admitting: Physician Assistant

## 2017-08-14 ENCOUNTER — Encounter: Payer: Self-pay | Admitting: Physician Assistant

## 2017-08-14 VITALS — BP 126/88 | HR 83 | Temp 98.3°F | Resp 18 | Ht 66.0 in | Wt 201.2 lb

## 2017-08-14 DIAGNOSIS — K219 Gastro-esophageal reflux disease without esophagitis: Secondary | ICD-10-CM | POA: Diagnosis not present

## 2017-08-14 DIAGNOSIS — R7303 Prediabetes: Secondary | ICD-10-CM | POA: Diagnosis not present

## 2017-08-14 DIAGNOSIS — Z7984 Long term (current) use of oral hypoglycemic drugs: Secondary | ICD-10-CM | POA: Insufficient documentation

## 2017-08-14 DIAGNOSIS — Z3200 Encounter for pregnancy test, result unknown: Secondary | ICD-10-CM | POA: Insufficient documentation

## 2017-08-14 DIAGNOSIS — I10 Essential (primary) hypertension: Secondary | ICD-10-CM | POA: Insufficient documentation

## 2017-08-14 LAB — POCT GLYCOSYLATED HEMOGLOBIN (HGB A1C): HEMOGLOBIN A1C: 6.1

## 2017-08-14 NOTE — Patient Instructions (Signed)
Hyperglycemia Hyperglycemia is when the sugar (glucose) level in your blood is too high. It may not cause symptoms. If you do have symptoms, they may include warning signs, such as:  Feeling more thirsty than normal.  Hunger.  Feeling tired.  Needing to pee (urinate) more than normal.  Blurry eyesight (vision). You may get other symptoms as it gets worse, such as:  Dry mouth.  Not being hungry (loss of appetite).  Fruity-smelling breath.  Weakness.  Weight gain or loss that is not planned. Weight loss may be fast.  A tingling or numb feeling in your hands or feet.  Headache.  Skin that does not bounce back quickly when it is lightly pinched and released (poor skin turgor).  Pain in your belly (abdomen).  Cuts or bruises that heal slowly. High blood sugar can happen to people who do or do not have diabetes. High blood sugar can happen slowly or quickly, and it can be an emergency. Follow these instructions at home: General instructions  Take over-the-counter and prescription medicines only as told by your doctor.  Do not use products that contain nicotine or tobacco, such as cigarettes and e-cigarettes. If you need help quitting, ask your doctor.  Limit alcohol intake to no more than 1 drink per day for nonpregnant women and 2 drinks per day for men. One drink equals 12 oz of beer, 5 oz of wine, or 1 oz of hard liquor.  Manage stress. If you need help with this, ask your doctor.  Keep all follow-up visits as told by your doctor. This is important. Eating and drinking  Stay at a healthy weight.  Exercise regularly, as told by your doctor.  Drink enough fluid, especially when you:  Exercise.  Get sick.  Are in hot temperatures.  Eat healthy foods, such as:  Low-fat (lean) proteins.  Complex carbs (complex carbohydrates), such as whole wheat bread or brown rice.  Fresh fruits and vegetables.  Low-fat dairy products.  Healthy fats.  Drink enough  fluid to keep your pee (urine) clear or pale yellow. If you have diabetes:  Make sure you know the symptoms of hyperglycemia.  Follow your diabetes management plan, as told by your doctor. Make sure you:  Take insulin and medicines as told.  Follow your exercise plan.  Follow your meal plan. Eat on time. Do not skip meals.  Check your blood sugar as often as told. Make sure to check before and after exercise. If you exercise longer or in a different way than you normally do, check your blood sugar more often.  Follow your sick day plan whenever you cannot eat or drink normally. Make this plan ahead of time with your doctor.  Share your diabetes management plan with people in your workplace, school, and household.  Check your urine for ketones when you are ill and as told by your doctor.  Carry a card or wear jewelry that says that you have diabetes. Contact a doctor if:  Your blood sugar level is higher than 240 mg/dL (13.3 mmol/L) for 2 days in a row.  You have problems keeping your blood sugar in your target range.  High blood sugar happens often for you. Get help right away if:  You have trouble breathing.  You have a change in how you think, feel, or act (mental status).  You feel sick to your stomach (nauseous), and that feeling does not go away.  You cannot stop throwing up (vomiting). These symptoms may be an   emergency. Do not wait to see if the symptoms will go away. Get medical help right away. Call your local emergency services (911 in the [place] Do not drive yourself to the hospital.  Summary  Hyperglycemia is when the sugar (glucose) level in your blood is too high.  High blood sugar can happen to people who do or do not have diabetes.  Make sure you drink enough fluids, eat healthy foods, and exercise regularly.  Contact your doctor if you have problems keeping your blood sugar in your target range. This information is not intended to replace advice given  to you by your health care provider. Make sure you discuss any questions you have with your health care provider. Document Released: 09/16/2009 Document Revised: 08/06/2016 Document Reviewed: 08/06/2016 Elsevier Interactive Patient Education  2017 Elsevier Inc.  

## 2017-08-14 NOTE — Progress Notes (Signed)
Patient ID: Sabrina Larson, female   DOB: June 11, 1976, 41 y.o.   MRN: [id number]     Sabrina Larson, is a 41 y.o. female  HCW:[id number]  VVO:[id number]  DOB - 12-06-1975  Subjective:  Chief Complaint and HPI: Sabrina Larson is a 41 y.o. female here today for a blood pregnancy test. LMP in June 2018-unsure exact date.  This was prior to starting a drug study for treatment of uterine fibroids.  The medication stops your period.  She is concerned bc she has been pregnant 6 times and all 6 times her urine pregnancy tests were negative but the blood test was positive.  She is experiencing nausea and fatigue. Taking study medication for fibroids- Vilaprisan  Doesn't check blood sugars at home-has never been instructed to.  Compliant with meds.  ROS:   Constitutional:  No f/c, No night sweats, No unexplained weight loss. EENT:  No vision changes, No blurry vision, No hearing changes. No mouth, throat, or ear problems.  Respiratory: No cough, No SOB Cardiac: No CP, no palpitations GI:  No abd pain, No N/V/D. GU: No Urinary s/sx Musculoskeletal: No joint pain Neuro: No headache, no dizziness, no motor weakness.  Skin: No rash Endocrine:  No polydipsia. No polyuria.  Psych: Denies SI/HI  No problems updated.  ALLERGIES: No Known Allergies  PAST MEDICAL HISTORY: Past Medical History:  Diagnosis Date  . GERD (gastroesophageal reflux disease)   . Hypertension   . Prediabetes     MEDICATIONS AT HOME: Prior to Admission medications   Medication Sig Start Date End Date Taking? Authorizing Provider  cetirizine (ZYRTEC) 10 MG tablet Take 1 tablet (10 mg total) by mouth daily. 02/25/17   Maren Reamer, MD  famotidine (PEPCID) 20 MG tablet Take 1 tablet (20 mg total) by mouth 2 (two) times daily. 02/25/17 02/25/18  Maren Reamer, MD  metFORMIN (GLUCOPHAGE) 500 MG tablet TAKE 1 TABLET(500 MG) BY MOUTH TWICE DAILY WITH A MEAL 07/12/17   Tresa Garter, MD  Multiple  Vitamins-Minerals (MULTIVITAMIN WITH MINERALS) tablet Take 1 tablet by mouth daily.    [provider]  triamcinolone ointment (KENALOG) 0.5 % Apply 1 application topically 2 (two) times daily. 02/25/17   Maren Reamer, MD     Objective:  EXAM:   Vitals:   08/14/17 1021  BP: 126/88  Pulse: 83  Resp: 18  Temp: 98.3 F (36.8 C)  TempSrc: Oral  SpO2: 97%  Weight: 201 lb 3.2 oz (91.3 kg)  Height: 5\' 6"  (1.676 m)    General appearance : A&OX3. NAD. Non-toxic-appearing HEENT: Atraumatic and Normocephalic.  PERRLA. EOM intact.  Neck: supple, no JVD. No cervical lymphadenopathy. No thyromegaly Chest/Lungs:  Breathing-non-labored, Good air entry bilaterally, breath sounds normal without rales, rhonchi, or wheezing  CVS: S1 S2 regular, no murmurs, gallops, rubs  Extremities: Bilateral Lower Ext shows no edema, both legs are warm to touch with = pulse throughout Neurology:  CN II-XII grossly intact, Non focal.   Psych:  TP linear. J/I WNL. Normal speech. Appropriate eye contact and affect.  Skin:  No Rash  Data Review Lab Results  Component Value Date   HGBA1C 6.4 (H) 02/25/2017   HGBA1C 6.4 (H) 04/17/2016     Assessment & Plan   1. Encounter for pregnancy test, result unknown H/o 6 pregnancies-all with negative urine pregnancy tests-had negative urine test at home. - Beta HCG, Quant  2. Prediabetes - HgB A1c Continue metformin  Patient have been counseled extensively about nutrition and  exercise  Return in about 3 months (around 11/13/2017) for assign new PCP; recheck prediabetes.  The patient was given clear instructions to go to ER or return to medical center if symptoms don't improve, worsen or new problems develop. The patient verbalized understanding. The patient was told to call to get lab results if they haven't heard anything in the next week.     Freeman Caldron, PA-C [facility] [place], [place]     08/14/2017, 10:28 AM

## 2017-08-15 ENCOUNTER — Telehealth: Payer: Self-pay | Admitting: *Deleted

## 2017-08-15 LAB — BETA HCG QUANT (REF LAB)

## 2017-08-15 NOTE — Telephone Encounter (Signed)
-----   Message from [place] Donovan, [place] sent at 08/15/2017  8:32 AM EDT ----- Please call patient and let her know that her blood pregnancy test was negative.  If symptoms persist, return to clinic for further work-up.  Some of the symptoms she is having may be side effects of the medication she is on for fibroid treatment.   Thanks, Freeman Caldron, PA-C

## 2017-08-15 NOTE — Telephone Encounter (Signed)
Patient verified DOB Patient is aware of HCG was negative. No further questions at this time.

## 2017-09-03 ENCOUNTER — Encounter (HOSPITAL_COMMUNITY): Payer: Self-pay | Admitting: *Deleted

## 2017-09-03 ENCOUNTER — Emergency Department (HOSPITAL_COMMUNITY)
Admission: EM | Admit: 2017-09-03 | Discharge: 2017-09-03 | Disposition: A | Discharge status: Home / Self Care | Payer: Medicaid Other | Attending: Emergency Medicine | Admitting: Emergency Medicine

## 2017-09-03 DIAGNOSIS — S29012A Strain of muscle and tendon of back wall of thorax, initial encounter: Secondary | ICD-10-CM | POA: Insufficient documentation

## 2017-09-03 DIAGNOSIS — Z7984 Long term (current) use of oral hypoglycemic drugs: Secondary | ICD-10-CM | POA: Insufficient documentation

## 2017-09-03 DIAGNOSIS — S199XXA Unspecified injury of neck, initial encounter: Secondary | ICD-10-CM | POA: Diagnosis present

## 2017-09-03 DIAGNOSIS — Y929 Unspecified place or not applicable: Secondary | ICD-10-CM | POA: Insufficient documentation

## 2017-09-03 DIAGNOSIS — Y999 Unspecified external cause status: Secondary | ICD-10-CM | POA: Diagnosis not present

## 2017-09-03 DIAGNOSIS — S46811A Strain of other muscles, fascia and tendons at shoulder and upper arm level, right arm, initial encounter: Secondary | ICD-10-CM

## 2017-09-03 DIAGNOSIS — Z79899 Other long term (current) drug therapy: Secondary | ICD-10-CM | POA: Insufficient documentation

## 2017-09-03 DIAGNOSIS — I10 Essential (primary) hypertension: Secondary | ICD-10-CM | POA: Diagnosis not present

## 2017-09-03 DIAGNOSIS — F17200 Nicotine dependence, unspecified, uncomplicated: Secondary | ICD-10-CM | POA: Diagnosis not present

## 2017-09-03 DIAGNOSIS — Y939 Activity, unspecified: Secondary | ICD-10-CM | POA: Diagnosis not present

## 2017-09-03 MED ORDER — KETOROLAC TROMETHAMINE 30 MG/ML IJ SOLN
15.0000 mg | Freq: Once | INTRAMUSCULAR | Status: AC
  Administered 2017-09-03: 15 mg via INTRAMUSCULAR
  Filled 2017-09-03: qty 1

## 2017-09-03 MED ORDER — CYCLOBENZAPRINE HCL 10 MG PO TABS: 10.0000 mg | ORAL_TABLET | Freq: Every evening | ORAL | 0 refills | Status: DC | PRN
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
## 2017-09-03 NOTE — Discharge Instructions (Signed)
Please read instructions below. Apply ice to your neck and back for 20 minutes at a time. You can also apply head it this provides you with more relief. You can take tylenol every 6 hours as needed for pain. You can take flexeril at bedtime as needed for muscle spasm. Schedule an appointment with your primary care provider if symptoms persist. Return to ER if new numbness or tingling in your arms or legs, inability to urinate, inability to hold your bowels, weakness in your extremities, severe headache, vision changes, vomiting, or new or concerning symptoms.

## 2017-11-13 ENCOUNTER — Ambulatory Visit: Payer: Medicaid Other | Admitting: Family Medicine

## 2017-11-20 ENCOUNTER — Ambulatory Visit: Payer: Medicaid Other | Admitting: Family Medicine

## 2017-12-24 ENCOUNTER — Encounter: Payer: Self-pay | Admitting: Family Medicine

## 2017-12-24 ENCOUNTER — Ambulatory Visit: Payer: Medicaid Other | Admitting: Licensed Clinical Social Worker

## 2017-12-24 ENCOUNTER — Ambulatory Visit: Payer: Medicaid Other | Attending: Family Medicine | Admitting: Family Medicine

## 2017-12-24 VITALS — BP 122/81 | HR 103 | Temp 98.0°F | Resp 18 | Ht 66.5 in | Wt 209.0 lb

## 2017-12-24 DIAGNOSIS — F4323 Adjustment disorder with mixed anxiety and depressed mood: Secondary | ICD-10-CM

## 2017-12-24 DIAGNOSIS — N390 Urinary tract infection, site not specified: Secondary | ICD-10-CM | POA: Diagnosis not present

## 2017-12-24 DIAGNOSIS — R350 Frequency of micturition: Secondary | ICD-10-CM

## 2017-12-24 DIAGNOSIS — K219 Gastro-esophageal reflux disease without esophagitis: Secondary | ICD-10-CM | POA: Insufficient documentation

## 2017-12-24 DIAGNOSIS — Z7984 Long term (current) use of oral hypoglycemic drugs: Secondary | ICD-10-CM | POA: Diagnosis not present

## 2017-12-24 DIAGNOSIS — M79671 Pain in right foot: Secondary | ICD-10-CM | POA: Diagnosis not present

## 2017-12-24 DIAGNOSIS — Z23 Encounter for immunization: Secondary | ICD-10-CM | POA: Insufficient documentation

## 2017-12-24 DIAGNOSIS — Z8711 Personal history of peptic ulcer disease: Secondary | ICD-10-CM | POA: Insufficient documentation

## 2017-12-24 DIAGNOSIS — M79672 Pain in left foot: Secondary | ICD-10-CM | POA: Diagnosis not present

## 2017-12-24 DIAGNOSIS — R5383 Other fatigue: Secondary | ICD-10-CM | POA: Diagnosis not present

## 2017-12-24 DIAGNOSIS — Z8719 Personal history of other diseases of the digestive system: Secondary | ICD-10-CM | POA: Insufficient documentation

## 2017-12-24 DIAGNOSIS — M722 Plantar fascial fibromatosis: Secondary | ICD-10-CM | POA: Diagnosis not present

## 2017-12-24 DIAGNOSIS — R7303 Prediabetes: Secondary | ICD-10-CM | POA: Diagnosis not present

## 2017-12-24 LAB — POCT URINALYSIS DIPSTICK
Bilirubin, UA: NEGATIVE
GLUCOSE UA: NEGATIVE
LEUKOCYTES UA: NEGATIVE
NITRITE UA: NEGATIVE
PH UA: 6.5 (ref 5.0–8.0)
RBC UA: NEGATIVE
SPEC GRAV UA: 1.02 (ref 1.010–1.025)
UROBILINOGEN UA: 1 U/dL

## 2017-12-24 LAB — POCT GLYCOSYLATED HEMOGLOBIN (HGB A1C): HEMOGLOBIN A1C: 6.3

## 2017-12-24 MED ORDER — CELECOXIB 200 MG PO CAPS: 200.0000 mg | ORAL_CAPSULE | Freq: Two times a day (BID) | ORAL | 1 refills | Status: DC | PRN

## 2017-12-24 MED ORDER — FAMOTIDINE 20 MG PO TABS: 20.0000 mg | ORAL_TABLET | Freq: Two times a day (BID) | ORAL | 5 refills | Status: DC

## 2017-12-24 MED ORDER — METFORMIN HCL 500 MG PO TABS: ORAL_TABLET | ORAL | 5 refills | Status: DC

## 2017-12-24 NOTE — Patient Instructions (Signed)
Prediabetes Eating Plan Prediabetes-also called impaired glucose tolerance or impaired fasting glucose-is a condition that causes blood sugar (blood glucose) levels to be higher than normal. Following a healthy diet can help to keep prediabetes under control. It can also help to lower the risk of type 2 diabetes and heart disease, which are increased in people who have prediabetes. Along with regular exercise, a healthy diet:  Promotes weight loss.  Helps to control blood sugar levels.  Helps to improve the way that the body uses insulin.  What do I need to know about this eating plan?  Use the glycemic index (GI) to plan your meals. The index tells you how quickly a food will raise your blood sugar. Choose low-GI foods. These foods take a longer time to raise blood sugar.  Pay close attention to the amount of carbohydrates in the food that you eat. Carbohydrates increase blood sugar levels.  Keep track of how many calories you take in. Eating the right amount of calories will help you to achieve a healthy weight. Losing about 7 percent of your starting weight can help to prevent type 2 diabetes.  You may want to follow a Mediterranean diet. This diet includes a lot of vegetables, lean meats or fish, whole grains, fruits, and healthy oils and fats. What foods can I eat? Grains Whole grains, such as whole-wheat or whole-grain breads, crackers, cereals, and pasta. Unsweetened oatmeal. Bulgur. Barley. Quinoa. Brown rice. Corn or whole-wheat flour tortillas or taco shells. Vegetables Lettuce. Spinach. Peas. Beets. Cauliflower. Cabbage. Broccoli. Carrots. Tomatoes. Squash. Eggplant. Herbs. Peppers. Onions. Cucumbers. Brussels sprouts. Fruits Berries. Bananas. Apples. Oranges. Grapes. Papaya. Mango. Pomegranate. Kiwi. Grapefruit. Cherries. Meats and Other Protein Sources Seafood. Lean meats, such as chicken and [place] or lean cuts of pork and beef. Tofu. Eggs. Nuts. Beans. Dairy Low-fat or  fat-free dairy products, such as yogurt, cottage cheese, and cheese. Beverages Water. Tea. Coffee. Sugar-free or diet soda. Seltzer water. Milk. Milk alternatives, such as soy or almond milk. Condiments Mustard. Relish. Low-fat, low-sugar ketchup. Low-fat, low-sugar barbecue sauce. Low-fat or fat-free mayonnaise. Sweets and Desserts Sugar-free or low-fat pudding. Sugar-free or low-fat ice cream and other frozen treats. Fats and Oils Avocado. Walnuts. Olive oil. The items listed above may not be a complete list of recommended foods or beverages. Contact your dietitian for more options. What foods are not recommended? Grains Refined white flour and flour products, such as bread, pasta, snack foods, and cereals. Beverages Sweetened drinks, such as sweet iced tea and soda. Sweets and Desserts Baked goods, such as cake, cupcakes, pastries, cookies, and cheesecake. The items listed above may not be a complete list of foods and beverages to avoid. Contact your dietitian for more information. This information is not intended to replace advice given to you by your health care provider. Make sure you discuss any questions you have with your health care provider. Document Released: 04/05/2015 Document Revised: 04/26/2016 Document Reviewed: 12/15/2014 Elsevier Interactive Patient Education  2017 Elsevier Inc.   Plantar Fasciitis Plantar fasciitis is a painful foot condition that affects the heel. It occurs when the band of tissue that connects the toes to the heel bone (plantar fascia) becomes irritated. This can happen after exercising too much or doing other repetitive activities (overuse injury). The pain from plantar fasciitis can range from mild irritation to severe pain that makes it difficult for you to walk or move. The pain is usually worse in the morning or after you have been sitting or lying down  for a while. What are the causes? This condition may be caused by:  Standing for long  periods of time.  Wearing shoes that do not fit.  Doing high-impact activities, including running, aerobics, and ballet.  Being overweight.  Having an abnormal way of walking (gait).  Having tight calf muscles.  Having high arches in your feet.  Starting a new athletic activity.  What are the signs or symptoms? The main symptom of this condition is heel pain. Other symptoms include:  Pain that gets worse after activity or exercise.  Pain that is worse in the morning or after resting.  Pain that goes away after you walk for a few minutes.  How is this diagnosed? This condition may be diagnosed based on your signs and symptoms. Your health care provider will also do a physical exam to check for:  A tender area on the bottom of your foot.  A high arch in your foot.  Pain when you move your foot.  Difficulty moving your foot.  You may also need to have imaging studies to confirm the diagnosis. These can include:  X-rays.  Ultrasound.  MRI.  How is this treated? Treatment for plantar fasciitis depends on the severity of the condition. Your treatment may include:  Rest, ice, and over-the-counter pain medicines to manage your pain.  Exercises to stretch your calves and your plantar fascia.  A splint that holds your foot in a stretched, upward position while you sleep (night splint).  Physical therapy to relieve symptoms and prevent problems in the future.  Cortisone injections to relieve severe pain.  Extracorporeal shock wave therapy (ESWT) to stimulate damaged plantar fascia with electrical impulses. It is often used as a last resort before surgery.  Surgery, if other treatments have not worked after 12 months.  Follow these instructions at home:  Take medicines only as directed by your health care provider.  Avoid activities that cause pain.  Roll the bottom of your foot over a bag of ice or a bottle of cold water. Do this for 20 minutes, 3-4 times a  day.  Perform simple stretches as directed by your health care provider.  Try wearing athletic shoes with air-sole or gel-sole cushions or soft shoe inserts.  Wear a night splint while sleeping, if directed by your health care provider.  Keep all follow-up appointments with your health care provider. How is this prevented?  Do not perform exercises or activities that cause heel pain.  Consider finding low-impact activities if you continue to have problems.  Lose weight if you need to. The best way to prevent plantar fasciitis is to avoid the activities that aggravate your plantar fascia. Contact a health care provider if:  Your symptoms do not go away after treatment with home care measures.  Your pain gets worse.  Your pain affects your ability to move or do your daily activities. This information is not intended to replace advice given to you by your health care provider. Make sure you discuss any questions you have with your health care provider. Document Released: 08/14/2001 Document Revised: 04/23/2016 Document Reviewed: 09/29/2014 Elsevier Interactive Patient Education  [employer].
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
## 2017-12-25 ENCOUNTER — Other Ambulatory Visit: Payer: Self-pay | Admitting: Family Medicine

## 2017-12-25 DIAGNOSIS — R350 Frequency of micturition: Secondary | ICD-10-CM

## 2017-12-25 DIAGNOSIS — R8290 Unspecified abnormal findings in urine: Secondary | ICD-10-CM

## 2017-12-25 LAB — CBC
HEMOGLOBIN: 14.2 g/dL (ref 11.1–15.9)
Hematocrit: 41.2 % (ref 34.0–46.6)
MCH: 27.3 pg (ref 26.6–33.0)
MCHC: 34.5 g/dL (ref 31.5–35.7)
MCV: 79 fL (ref 79–97)
Platelets: 349 10*3/uL (ref 150–379)
RBC: 5.21 x10E6/uL (ref 3.77–5.28)
RDW: 14.3 % (ref 12.3–15.4)
WBC: 9 10*3/uL (ref 3.4–10.8)

## 2017-12-25 NOTE — BH Specialist Note (Signed)
Integrated Behavioral Health Initial Visit  MRN: [id number] Name: Sabrina Larson  Number of [facility] Clinician visits:: 1/6 Session Start time: 3:30 PM  Session End time: 4:00 PM Total time: 30 minutes  Type of Service: [place] Interpretor:No. Interpretor Name and Language: N/A   Warm Hand Off Completed.       SUBJECTIVE: Sabrina Larson is a 42 y.o. female accompanied by self Patient was referred by FNP Hairston for depression and anxiety. Patient reports the following symptoms/concerns: low energy, difficulty sleeping, and racing thoughts Duration of problem: Over a month; Severity of problem: moderate  OBJECTIVE: Mood: Anxious and Affect: Appropriate Risk of harm to self or others: No plan to harm self or others  LIFE CONTEXT: Family and Social: Pt receives support from spouse and children (23, 13, 13, 52, 67, 37yo)  School/Work: Pt is about to start a new job in February Self-Care: Pt smokes cigarettes (.5 ppd) She enjoys taking walks and talking with friends Life Changes: Pt has difficulty managing pre-diabetes. She is in the process of obtaining custody of grandchildren (82 and 55 yo) from [place]: Patient will: 1. Reduce symptoms of: anxiety and depression 2. Increase knowledge and/or ability of: coping skills and healthy habits  3. Demonstrate ability to: Increase adequate support systems for patient/family  INTERVENTIONS: Interventions utilized: Mindfulness or Psychologist, educational, Supportive Counseling, Psychoeducation and/or Health Education and Link to [employer]  Standardized Assessments completed: GAD-7 and PHQ 2&9  ASSESSMENT: Patient currently experiencing depression and anxiety triggered by ongoing medical concerns and obtaining custody of minor grandchildren from [place]. She reports low energy, difficulty sleeping, and racing thoughts. Pt receives support from family and friends.    Patient may benefit from psychoeducation and psychotherapy. [place] educated pt on correlation between one's physical and mental health, in addition, to how stress can negatively impact health. LCSWA discussed therapeutic interventions to assist in relaxation and goal setting to better manage health. Pt was referred to [facility] for food insecurity and education on how to prepare healthy meals to manage pre-diabetes.   PLAN: 1. Follow up with behavioral health clinician on : Pt was encouraged to contact LCSWA if symptoms worsen or fail to improve to schedule behavioral appointments at [facility]. 2. Behavioral recommendations: LCSWA recommends that pt apply healthy coping skills discussed and utilize provided resources. Pt is encouraged to schedule follow up appointment with LCSWA 3. Referral(s): Community Resources:  Food 4. "From scale of 1-10, how likely are you to follow plan?": 10/10  Rebekah Chesterfield, LCSW 12/26/16 5:17 PM

## 2017-12-27 ENCOUNTER — Telehealth: Payer: Self-pay | Admitting: *Deleted

## 2017-12-27 NOTE — Telephone Encounter (Signed)
Medical Assistant left message on patient's home and cell voicemail. Voicemail states to give a call back to [place] with [facility] at [phone]. Patient is aware of labs being normal and anemia not being present.

## 2017-12-27 NOTE — Telephone Encounter (Signed)
-----   Message from Alfonse Spruce, [place] sent at 12/25/2017  4:12 PM EST ----- Labs normal, no anemia present.

## 2018-02-27 ENCOUNTER — Ambulatory Visit (HOSPITAL_COMMUNITY)
Admission: EM | Admit: 2018-02-27 | Discharge: 2018-02-27 | Disposition: A | Discharge status: Home / Self Care | Payer: Medicaid Other | Attending: Emergency Medicine | Admitting: Emergency Medicine

## 2018-02-27 ENCOUNTER — Encounter (HOSPITAL_COMMUNITY): Payer: Self-pay | Admitting: Emergency Medicine

## 2018-02-27 ENCOUNTER — Other Ambulatory Visit: Payer: Self-pay

## 2018-02-27 DIAGNOSIS — Z79899 Other long term (current) drug therapy: Secondary | ICD-10-CM | POA: Diagnosis not present

## 2018-02-27 DIAGNOSIS — R7303 Prediabetes: Secondary | ICD-10-CM | POA: Insufficient documentation

## 2018-02-27 DIAGNOSIS — F17210 Nicotine dependence, cigarettes, uncomplicated: Secondary | ICD-10-CM | POA: Insufficient documentation

## 2018-02-27 DIAGNOSIS — J069 Acute upper respiratory infection, unspecified: Secondary | ICD-10-CM | POA: Diagnosis not present

## 2018-02-27 DIAGNOSIS — R05 Cough: Secondary | ICD-10-CM | POA: Diagnosis present

## 2018-02-27 DIAGNOSIS — Z825 Family history of asthma and other chronic lower respiratory diseases: Secondary | ICD-10-CM | POA: Insufficient documentation

## 2018-02-27 DIAGNOSIS — I10 Essential (primary) hypertension: Secondary | ICD-10-CM | POA: Diagnosis not present

## 2018-02-27 DIAGNOSIS — Z7984 Long term (current) use of oral hypoglycemic drugs: Secondary | ICD-10-CM | POA: Diagnosis not present

## 2018-02-27 DIAGNOSIS — Z8249 Family history of ischemic heart disease and other diseases of the circulatory system: Secondary | ICD-10-CM | POA: Insufficient documentation

## 2018-02-27 DIAGNOSIS — Z8711 Personal history of peptic ulcer disease: Secondary | ICD-10-CM | POA: Insufficient documentation

## 2018-02-27 DIAGNOSIS — K219 Gastro-esophageal reflux disease without esophagitis: Secondary | ICD-10-CM | POA: Diagnosis not present

## 2018-02-27 DIAGNOSIS — B9789 Other viral agents as the cause of diseases classified elsewhere: Secondary | ICD-10-CM | POA: Diagnosis not present

## 2018-02-27 LAB — POCT RAPID STREP A: STREPTOCOCCUS, GROUP A SCREEN (DIRECT): NEGATIVE

## 2018-02-27 MED ORDER — AZITHROMYCIN 250 MG PO TABS: 250.0000 mg | ORAL_TABLET | Freq: Every day | ORAL | 0 refills | Status: AC

## 2018-02-27 MED ORDER — BENZONATATE 200 MG PO CAPS
200.0000 mg | ORAL_CAPSULE | Freq: Three times a day (TID) | ORAL | 0 refills | Status: AC | PRN
Start: 2018-02-27 — End: 2018-03-06

## 2018-02-27 MED ORDER — CETIRIZINE HCL 10 MG PO CAPS: 10.0000 mg | ORAL_CAPSULE | Freq: Every day | ORAL | 0 refills | Status: DC

## 2018-02-27 MED ORDER — FLUTICASONE PROPIONATE 50 MCG/ACT NA SUSP: 1.0000 | Freq: Every day | NASAL | 0 refills | Status: DC

## 2018-02-27 NOTE — ED Provider Notes (Signed)
[facility]    CSN: [id number] Arrival date & time: 02/27/18  1237     History   Chief Complaint Chief Complaint  Patient presents with  . Cough    HPI Sabrina Larson is a 42 y.o. female hx of htn, DM type 2, Patient is presenting with URI symptoms- congestion, cough, sore throat.  Patient also noting body aches, decreased appetite and weakness.  Patient's main complaints are feeling poor overall. Symptoms have been going on for 4 days. Patient has tried Copywriter, advertising plus, with minimal relief. Denies fever, nausea, vomiting, diarrhea. Denies shortness of breath and chest pain.    HPI  Past Medical History:  Diagnosis Date  . GERD (gastroesophageal reflux disease)   . Hypertension   . Prediabetes     Patient Active Problem List   Diagnosis Date Noted  . History of gastric ulcer 12/24/2017  . GERD (gastroesophageal reflux disease) 01/22/2017  . Acute conjunctivitis of both eyes 01/22/2017  . Hypertension 07/05/2016  . Prediabetes 05/02/2016    Past Surgical History:  Procedure Laterality Date  . CESAREAN SECTION     2014, 1995  . DILATION AND CURETTAGE OF UTERUS     8 total in office and outpatient  . SKIN GRAFT     at age 59    OB History    Gravida  30   Para  6   Term  6   Preterm  0   AB  8   Living  6     SAB  0   TAB  0   Ectopic  0   Multiple  0   Live Births           Obstetric Comments  TSVD x 4. Term c-section x 2. ?8 D&Cs         Home Medications    Prior to Admission medications   Medication Sig Start Date End Date Taking? Authorizing Provider  azithromycin (ZITHROMAX) 250 MG tablet Take 1 tablet (250 mg total) by mouth daily for 5 days. Take first 2 tablets together, then 1 every day until finished. 02/27/18 03/04/18  Wieters, Hallie C, PA-C  benzonatate (TESSALON) 200 MG capsule Take 1 capsule (200 mg total) by mouth 3 (three) times daily as needed for up to 7 days for cough. 02/27/18 03/06/18  Wieters, Hallie C,  PA-C  celecoxib (CELEBREX) 200 MG capsule Take 1 capsule (200 mg total) by mouth 2 (two) times daily as needed for moderate pain. 12/24/17   Alfonse Spruce, FNP  Cetirizine HCl 10 MG CAPS Take 1 capsule (10 mg total) by mouth daily for 15 days. 02/27/18 03/14/18  Wieters, Hallie C, PA-C  cyclobenzaprine (FLEXERIL) 10 MG tablet Take 1 tablet (10 mg total) by mouth at bedtime as needed. 09/03/17   Robinson, [place] N, PA-C  famotidine (PEPCID) 20 MG tablet Take 1 tablet (20 mg total) by mouth 2 (two) times daily before a meal. 12/24/17   Hairston, Maylon Peppers, FNP  fluticasone (FLONASE) 50 MCG/ACT nasal spray Place 1-2 sprays into both nostrils daily for 7 days. 02/27/18 03/06/18  Wieters, Hallie C, PA-C  metFORMIN (GLUCOPHAGE) 500 MG tablet TAKE 1 TABLET(500 MG) BY MOUTH TWICE DAILY WITH A MEAL 12/24/17   Hairston, Maylon Peppers, FNP  Multiple Vitamins-Minerals (MULTIVITAMIN WITH MINERALS) tablet Take 1 tablet by mouth daily.    [provider]  triamcinolone ointment (KENALOG) 0.5 % Apply 1 application topically 2 (two) times daily. 02/25/17   Janne Napoleon, [employer]  T, MD    Family History Family History  Problem Relation Age of Onset  . Asthma Mother   . Hypertension Mother   . Asthma Brother     Social History Social History   Tobacco Use  . Smoking status: Current Every Day Smoker    Packs/day: 0.50  . Smokeless tobacco: Never Used  Substance Use Topics  . Alcohol use: Yes    Alcohol/week: 0.6 - 1.2 oz    Types: 1 - 2 Glasses of wine per week    Comment: occasional  . Drug use: No     Allergies   Patient has no known allergies.   Review of Systems Review of Systems  Constitutional: Positive for appetite change and fatigue. Negative for chills and fever.  HENT: Positive for congestion, rhinorrhea and sore throat. Negative for ear pain, sinus pressure and trouble swallowing.   Respiratory: Positive for cough. Negative for chest tightness and shortness of breath.     Cardiovascular: Negative for chest pain.  Gastrointestinal: Negative for abdominal pain, nausea and vomiting.  Musculoskeletal: Positive for myalgias.  Skin: Negative for rash.  Neurological: Negative for dizziness, light-headedness and headaches.     Physical Exam Triage Vital Signs ED Triage Vitals  Enc Vitals Group     BP 02/27/18 1340 (!) 149/104     Pulse Rate 02/27/18 1340 (!) 104     Resp --      Temp 02/27/18 1340 99.4 F (37.4 C)     Temp Source 02/27/18 1340 Oral     SpO2 02/27/18 1340 99 %     Weight --      Height --      Head Circumference --      Peak Flow --      Pain Score 02/27/18 1338 8     Pain Loc --      Pain Edu? --      Excl. in [place]? --    No data found.  Updated Vital Signs BP (!) 149/104 (BP Location: Left Arm)   Pulse (!) 104   Temp 99.4 F (37.4 C) (Oral)   LMP 02/03/2018   SpO2 99%   Visual Acuity Right Eye Distance:   Left Eye Distance:   Bilateral Distance:    Right Eye Near:   Left Eye Near:    Bilateral Near:     Physical Exam  Constitutional: She appears well-developed and well-nourished. No distress.  Appears mildly uncomfortable  HENT:  Head: Normocephalic and atraumatic.  Bilateral TMs nonerythematous, nasal mucosa erythematous with clear rhinorrhea present, posterior oropharynx mildly erythematous, no tonsillar enlargement or exudate.  Eyes: Conjunctivae are normal.  Neck: Neck supple.  Cardiovascular: Normal rate and regular rhythm.  No murmur heard. Pulmonary/Chest: Effort normal and breath sounds normal. No respiratory distress.  Breathing comfortably at rest, CTA BL  Abdominal: Soft. There is no tenderness.  Musculoskeletal: She exhibits no edema.  Neurological: She is alert.  Skin: Skin is warm and dry.  Psychiatric: She has a normal mood and affect.  Nursing note and vitals reviewed.    UC Treatments / Results  Labs (all labs ordered are listed, but only abnormal results are displayed) Labs Reviewed   CULTURE, GROUP A STREP [facility])    EKG None Radiology No results found.  Procedures Procedures (including critical care time)  Medications Ordered in UC Medications - No data to display   Initial Impression / Assessment and Plan / UC Course  I have reviewed the triage  vital signs and the nursing notes.  Pertinent labs & imaging results that were available during my care of the patient were reviewed by me and considered in my medical decision making (see chart for details).     Patient likely with viral URI versus influenza-like illness.  Will recommend to continue symptomatic treatment.  Will provide Zyrtec and Flonase.  Tessalon for cough.  Tylenol and ibuprofen for body aches and fever.  Provided prescription for azithromycin as patient states she has chronic bronchitis and is a smoker.  Will have her fill at end of week and if symptoms not improving in another 3-4 days with recommendations. Discussed strict return precautions. Patient verbalized understanding and is agreeable with plan.   Final Clinical Impressions(s) / UC Diagnoses   Final diagnoses:  Viral URI with cough    ED Discharge Orders        Ordered    Cetirizine HCl 10 MG CAPS  Daily     02/27/18 1415    fluticasone (FLONASE) 50 MCG/ACT nasal spray  Daily     02/27/18 1415    benzonatate (TESSALON) 200 MG capsule  3 times daily PRN     02/27/18 1415    azithromycin (ZITHROMAX) 250 MG tablet  Daily     02/27/18 1415       Controlled Substance Prescriptions [place] Controlled Substance Registry consulted? Not Applicable   Janith Lima, [place] 02/27/18 1441

## 2018-02-27 NOTE — Discharge Instructions (Addendum)
I am treating you for a viral respiratory infection.  Recommend to continue symptomatic treatment.  For congestion please begin daily Zyrtec, please pare with daily Flonase nasal spray.  For cough you may use Tessalon.  You may also try honey mixed in warm tea.  Please take Tylenol and ibuprofen to help with fevers, body aches, headache.  I expect your symptoms to gradually improve over the next week.  If you are not having any improvement by the end of the weekend you may fill the printed prescription for azithromycin.

## 2018-02-27 NOTE — ED Triage Notes (Signed)
C/o body aches, productive cough, rhinitis and sneezing onset Tuesday

## 2018-03-02 LAB — CULTURE, GROUP A STREP (THRC)

## 2018-03-04 ENCOUNTER — Telehealth (HOSPITAL_COMMUNITY): Payer: Self-pay

## 2018-03-04 NOTE — Telephone Encounter (Signed)
Pt contacted regarding results from recent visit. Reports feeling better, instructed if she has persistent sore throat or fevers to take her antibiotics per Dr. Valere Dross note.

## 2018-06-19 ENCOUNTER — Ambulatory Visit: Payer: Medicaid Other | Attending: Family Medicine | Admitting: Physician Assistant

## 2018-06-19 VITALS — BP 135/90 | HR 84 | Temp 98.3°F | Resp 18 | Ht 66.0 in | Wt 195.0 lb

## 2018-06-19 DIAGNOSIS — H109 Unspecified conjunctivitis: Secondary | ICD-10-CM | POA: Insufficient documentation

## 2018-06-19 DIAGNOSIS — Z791 Long term (current) use of non-steroidal anti-inflammatories (NSAID): Secondary | ICD-10-CM | POA: Insufficient documentation

## 2018-06-19 DIAGNOSIS — K219 Gastro-esophageal reflux disease without esophagitis: Secondary | ICD-10-CM | POA: Diagnosis not present

## 2018-06-19 DIAGNOSIS — Z79899 Other long term (current) drug therapy: Secondary | ICD-10-CM | POA: Insufficient documentation

## 2018-06-19 DIAGNOSIS — M62838 Other muscle spasm: Secondary | ICD-10-CM | POA: Insufficient documentation

## 2018-06-19 DIAGNOSIS — Z7984 Long term (current) use of oral hypoglycemic drugs: Secondary | ICD-10-CM | POA: Diagnosis not present

## 2018-06-19 DIAGNOSIS — E1165 Type 2 diabetes mellitus with hyperglycemia: Secondary | ICD-10-CM

## 2018-06-19 DIAGNOSIS — I10 Essential (primary) hypertension: Secondary | ICD-10-CM | POA: Insufficient documentation

## 2018-06-19 DIAGNOSIS — H1012 Acute atopic conjunctivitis, left eye: Secondary | ICD-10-CM

## 2018-06-19 LAB — GLUCOSE, POCT (MANUAL RESULT ENTRY): POC Glucose: 99 mg/dl (ref 70–99)

## 2018-06-19 MED ORDER — METHOCARBAMOL 500 MG PO TABS: 500.0000 mg | ORAL_TABLET | Freq: Three times a day (TID) | ORAL | 0 refills | Status: DC

## 2018-06-19 MED ORDER — OLOPATADINE HCL 0.1 % OP SOLN: 1.0000 [drp] | Freq: Two times a day (BID) | OPHTHALMIC | 12 refills | Status: DC

## 2018-06-19 MED ORDER — METFORMIN HCL 500 MG PO TABS: ORAL_TABLET | ORAL | 5 refills | Status: DC

## 2018-06-19 MED ORDER — NAPROXEN 500 MG PO TABS: 500.0000 mg | ORAL_TABLET | Freq: Two times a day (BID) | ORAL | 0 refills | Status: DC

## 2018-06-19 NOTE — Progress Notes (Signed)
Patient ID: Sabrina Larson, female   DOB: [birth year], 42 y.o.   MRN: [id number]   Sabrina Larson, is a 42 y.o. female  [id number]:[id number]  [id number]  DOB - 1976-06-10  Subjective:  Chief Complaint and HPI: Sabrina Larson is a 42 y.o. female here today for med RF.  Also L eye has been itchy and crusty in the mornings.  She has had some runny nose and mild ST earlier in the week.  No f/c.  No eye pain.    Also c/o L trapezius spasm.  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  OTC has helped.  No numbness/weakness.    Social:  Works for [employer]  ROS:   Constitutional:  No f/c, No night sweats, No unexplained weight loss. EENT:  No vision changes, No blurry vision, No hearing changes. No mouth, throat, or ear problems.  Respiratory: No cough, No SOB Cardiac: No CP, no palpitations GI:  No abd pain, No N/V/D. GU: No Urinary s/sx Musculoskeletal: L trapezius spasm Neuro: No headache, no dizziness, no motor weakness.  Skin: No rash Endocrine:  No polydipsia. No polyuria.  Psych: Denies SI/HI  No problems updated.  ALLERGIES: No Known Allergies  PAST MEDICAL HISTORY: Past Medical History:  Diagnosis Date  . GERD (gastroesophageal reflux disease)   . Hypertension   . Prediabetes     MEDICATIONS AT HOME: Prior to Admission medications   Medication Sig Start Date End Date Taking? Authorizing Provider  celecoxib (CELEBREX) 200 MG capsule Take 1 capsule (200 mg total) by mouth 2 (two) times daily as needed for moderate pain. 12/24/17   Alfonse Spruce, FNP  Cetirizine HCl 10 MG CAPS Take 1 capsule (10 mg total) by mouth daily for 15 days. 02/27/18 03/14/18  Wieters, Hallie C, PA-C  cyclobenzaprine (FLEXERIL) 10 MG tablet Take 1 tablet (10 mg total) by mouth at bedtime as needed. 09/03/17   Robinson, [place] N, PA-C  famotidine (PEPCID) 20 MG tablet Take 1 tablet (20 mg total) by mouth 2 (two) times daily before a  meal. 12/24/17   Hairston, Maylon Peppers, FNP  fluticasone (FLONASE) 50 MCG/ACT nasal spray Place 1-2 sprays into both nostrils daily for 7 days. 02/27/18 03/06/18  Wieters, Hallie C, PA-C  metFORMIN (GLUCOPHAGE) 500 MG tablet TAKE 1 TABLET(500 MG) BY MOUTH TWICE DAILY WITH A MEAL 06/19/18   Hiromi Knodel, Levada Dy M, PA-C  methocarbamol (ROBAXIN) 500 MG tablet Take 1 tablet (500 mg total) by mouth 3 (three) times daily. Prn muscle spasm 06/19/18   Argentina Donovan, PA-C  Multiple Vitamins-Minerals (MULTIVITAMIN WITH MINERALS) tablet Take 1 tablet by mouth daily.    [provider]  naproxen (NAPROSYN) 500 MG tablet Take 1 tablet (500 mg total) by mouth 2 (two) times daily with a meal. Prn pain 06/19/18   Argentina Donovan, PA-C  olopatadine (PATANOL) 0.1 % ophthalmic solution Place 1 drop into the left eye 2 (two) times daily. Prn eye allergies 06/19/18   Argentina Donovan, PA-C  triamcinolone ointment (KENALOG) 0.5 % Apply 1 application topically 2 (two) times daily. 02/25/17   Maren Reamer, MD     Objective:  EXAM:   Vitals:   06/19/18 1457  BP: 135/90  Pulse: 84  Resp: 18  Temp: 98.3 F (36.8 C)  TempSrc: Oral  SpO2: 97%  Weight: 195 lb (88.5 kg)  Height: 5\' 6"  (1.676 m)    General appearance :  A&OX3. NAD. Non-toxic-appearing HEENT: Atraumatic and Normocephalic.  PERRLA. EOM intact.  L conjunctivae with pale erythema and chemosis, but not bright red.  Fundi benign B. No preauricular LN.  TM clear B. Mouth-MMM, post pharynx WNL w/o erythema, No PND. Neck: supple, no JVD. No cervical lymphadenopathy. No thyromegaly Chest/Lungs:  Breathing-non-labored, Good air entry bilaterally, breath sounds normal without rales, rhonchi, or wheezing  CVS: S1 S2 regular, no murmurs, gallops, rubs  L trapezius with spasm.  UEDTR=B Extremities: Bilateral Lower Ext shows no edema, both legs are warm to touch with = pulse throughout Neurology:  CN II-XII grossly intact, Non focal.   Psych:  TP linear.  J/I WNL. Normal speech. Appropriate eye contact and affect.  Skin:  No Rash  Data Review Lab Results  Component Value Date   HGBA1C 6.3 12/24/2017   HGBA1C 6.1 08/14/2017   HGBA1C 6.4 (H) 02/25/2017     Assessment & Plan   1. Muscle spasm - methocarbamol (ROBAXIN) 500 MG tablet; Take 1 tablet (500 mg total) by mouth 3 (three) times daily. Prn muscle spasm  Dispense: 90 tablet; Refill: 0 - naproxen (NAPROSYN) 500 MG tablet; Take 1 tablet (500 mg total) by mouth 2 (two) times daily with a meal. Prn pain  Dispense: 60 tablet; Refill: 0  2. Type 2 diabetes mellitus with hyperglycemia, without long-term current use of insulin (HCC) Continue current regimen pending labs may need to make changes - Glucose (CBG) - Comprehensive metabolic panel - Hemoglobin A1c - metFORMIN (GLUCOPHAGE) 500 MG tablet; TAKE 1 TABLET(500 MG) BY MOUTH TWICE DAILY WITH A MEAL  Dispense: 60 tablet; Refill: 5  3. Allergic conjunctivitis of left eye vs viral - olopatadine (PATANOL) 0.1 % ophthalmic solution; Place 1 drop into the left eye 2 (two) times daily. Prn eye allergies  Dispense: 5 mL; Refill: 12     Patient have been counseled extensively about nutrition and exercise  Return in about 3 months (around 09/19/2018) for assign PCP; check DM.  The patient was given clear instructions to go to ER or return to medical center if symptoms don't improve, worsen or new problems develop. The patient verbalized understanding. The patient was told to call to get lab results if they haven't heard anything in the next week.     Freeman Caldron, PA-C [facility] and [facility], [place]   06/19/2018, 3:17 PM

## 2018-06-20 LAB — COMPREHENSIVE METABOLIC PANEL
ALT: 11 IU/L (ref 0–32)
AST: 16 IU/L (ref 0–40)
Albumin/Globulin Ratio: 1.5 (ref 1.2–2.2)
Albumin: 3.9 g/dL (ref 3.5–5.5)
Alkaline Phosphatase: 87 IU/L (ref 39–117)
BUN/Creatinine Ratio: 16 (ref 9–23)
BUN: 13 mg/dL (ref 6–24)
Bilirubin Total: 0.3 mg/dL (ref 0.0–1.2)
CALCIUM: 9 mg/dL (ref 8.7–10.2)
CO2: 25 mmol/L (ref 20–29)
CREATININE: 0.81 mg/dL (ref 0.57–1.00)
Chloride: 107 mmol/L — ABNORMAL HIGH (ref 96–106)
GFR, EST AFRICAN AMERICAN: 104 mL/min/{1.73_m2} (ref >59)
GFR, EST NON AFRICAN AMERICAN: 90 mL/min/{1.73_m2} (ref >59)
GLOBULIN, TOTAL: 2.6 g/dL (ref 1.5–4.5)
Glucose: 87 mg/dL (ref 65–99)
Potassium: 4.3 mmol/L (ref 3.5–5.2)
Sodium: 142 mmol/L (ref 134–144)
TOTAL PROTEIN: 6.5 g/dL (ref 6.0–8.5)

## 2018-06-20 LAB — HEMOGLOBIN A1C
ESTIMATED AVERAGE GLUCOSE: 120 mg/dL
Hgb A1c MFr Bld: 5.8 % — ABNORMAL HIGH (ref 4.8–5.6)

## 2018-06-24 ENCOUNTER — Telehealth: Payer: Self-pay | Admitting: *Deleted

## 2018-06-24 NOTE — Telephone Encounter (Signed)
-----   Message from Argentina Donovan, [place] sent at 06/20/2018 12:21 PM EDT ----- Your blood work over all looks good.  Your blood sugar test is in the prediabetes range.  You should limit/eliminate sugars from your diet to help prevent your chance of developing diabetes.  We will recheck this at follow-up.  Thanks, Freeman Caldron, PA-C

## 2018-06-24 NOTE — Telephone Encounter (Signed)
Patient verified DOB Patient is aware of blood work being overall normal and needing to limit/eliminate sugar from her diet to avoid risking full diabetes. Patient is aware of a recheck being completed at the next visit. No further questions.

## 2018-07-16 ENCOUNTER — Ambulatory Visit: Payer: Medicaid Other | Attending: Family Medicine | Admitting: Physician Assistant

## 2018-07-16 VITALS — BP 141/97 | HR 79 | Temp 98.2°F | Resp 16 | Wt 192.8 lb

## 2018-07-16 DIAGNOSIS — M546 Pain in thoracic spine: Secondary | ICD-10-CM | POA: Insufficient documentation

## 2018-07-16 DIAGNOSIS — I10 Essential (primary) hypertension: Secondary | ICD-10-CM | POA: Diagnosis not present

## 2018-07-16 DIAGNOSIS — M545 Low back pain: Secondary | ICD-10-CM | POA: Diagnosis not present

## 2018-07-16 DIAGNOSIS — R7303 Prediabetes: Secondary | ICD-10-CM | POA: Diagnosis not present

## 2018-07-16 DIAGNOSIS — M62838 Other muscle spasm: Secondary | ICD-10-CM | POA: Diagnosis not present

## 2018-07-16 DIAGNOSIS — Z7984 Long term (current) use of oral hypoglycemic drugs: Secondary | ICD-10-CM | POA: Diagnosis not present

## 2018-07-16 DIAGNOSIS — G8929 Other chronic pain: Secondary | ICD-10-CM | POA: Diagnosis not present

## 2018-07-16 LAB — GLUCOSE, POCT (MANUAL RESULT ENTRY): POC GLUCOSE: 89 mg/dL (ref 70–99)

## 2018-07-16 MED ORDER — NAPROXEN 500 MG PO TABS: 500.0000 mg | ORAL_TABLET | Freq: Two times a day (BID) | ORAL | 1 refills | Status: DC

## 2018-07-16 MED ORDER — METHOCARBAMOL 500 MG PO TABS: 500.0000 mg | ORAL_TABLET | Freq: Three times a day (TID) | ORAL | 1 refills | Status: DC

## 2018-07-16 NOTE — Progress Notes (Signed)
Patient ID: Sabrina Larson, female   DOB: 1976-06-22, 42 y.o.   MRN: [id number]   Sabrina Larson, is a 42 y.o. female  PYP:[id number]  [id number]:[id number]  DOB - [birth year]  Subjective:  Chief Complaint and HPI: Sabrina Larson is a 42 y.o. female here today for continued upper and lower back pain now for about 3 months.  No UE/LE paresthesias/weakness.  Works at [employer].  Does a lot of lifting/sorting.  No urinary s/sx.  Methocarbamol providing minimal relief.  Not taking anti-inflammatory.  NKI.    Social:  Works at [employer].     ROS:   Constitutional:  No f/c, No night sweats, No unexplained weight loss. EENT:  No vision changes, No blurry vision, No hearing changes. No mouth, throat, or ear problems.  Respiratory: No cough, No SOB Cardiac: No CP, no palpitations GI:  No abd pain, No N/V/D. GU: No Urinary s/sx Musculoskeletal: + back pain Neuro: No headache, no dizziness, no motor weakness.  Skin: No rash Endocrine:  No polydipsia. No polyuria.  Psych: Denies SI/HI  No problems updated.  ALLERGIES: No Known Allergies  PAST MEDICAL HISTORY: Past Medical History:  Diagnosis Date  . GERD (gastroesophageal reflux disease)   . Hypertension   . Prediabetes     MEDICATIONS AT HOME: Prior to Admission medications   Medication Sig Start Date End Date Taking? Authorizing Provider  Cetirizine HCl 10 MG CAPS Take 1 capsule (10 mg total) by mouth daily for 15 days. 02/27/18 03/14/18  Wieters, Hallie C, PA-C  famotidine (PEPCID) 20 MG tablet Take 1 tablet (20 mg total) by mouth 2 (two) times daily before a meal. 12/24/17   Hairston, Mandesia R, FNP  fluticasone (FLONASE) 50 MCG/ACT nasal spray Place 1-2 sprays into both nostrils daily for 7 days. 02/27/18 03/06/18  Wieters, Hallie C, PA-C  metFORMIN (GLUCOPHAGE) 500 MG tablet TAKE 1 TABLET(500 MG) BY MOUTH TWICE DAILY WITH A MEAL 06/19/18   Darrel Gloss, Levada Dy M, PA-C  methocarbamol (ROBAXIN) 500 MG tablet Take 1 tablet (500 mg total) by  mouth 3 (three) times daily. X 10 days then prn muscle spasm 07/16/18   Argentina Donovan, PA-C  Multiple Vitamins-Minerals (MULTIVITAMIN WITH MINERALS) tablet Take 1 tablet by mouth daily.    [provider]  naproxen (NAPROSYN) 500 MG tablet Take 1 tablet (500 mg total) by mouth 2 (two) times daily with a meal. X 10 days then prn pain 07/16/18   Freeman Caldron M, PA-C  olopatadine (PATANOL) 0.1 % ophthalmic solution Place 1 drop into the left eye 2 (two) times daily. Prn eye allergies 06/19/18   Argentina Donovan, PA-C  triamcinolone ointment (KENALOG) 0.5 % Apply 1 application topically 2 (two) times daily. 02/25/17   Maren Reamer, MD     Objective:  EXAM:   Vitals:   07/16/18 0925  BP: (!) 141/97  Pulse: 79  Resp: 16  Temp: 98.2 F (36.8 C)  TempSrc: Oral  SpO2: 99%  Weight: 192 lb 12.8 oz (87.5 kg)    General appearance : A&OX3. NAD. Non-toxic-appearing HEENT: Atraumatic and Normocephalic.  PERRLA. EOM intact.   Neck: supple, no JVD. No cervical lymphadenopathy. No thyromegaly Chest/Lungs:  Breathing-non-labored, Good air entry bilaterally, breath sounds normal without rales, rhonchi, or wheezing  CVS: S1 S2 regular, no murmurs, gallops, rubs  Back:  Full S&ROM.  There is B trapezius spasm and paraspinus spasm B lower back.  No bony TTP.  Neg SLR B.  UE/LE DTR= intact B.  Extremities: Bilateral Lower Ext shows no edema, both legs are warm to touch with = pulse throughout Neurology:  CN II-XII grossly intact, Non focal.   Psych:  TP linear. J/I WNL. Normal speech. Appropriate eye contact and affect.  Skin:  No Rash  Data Review Lab Results  Component Value Date   HGBA1C 5.8 (H) 06/19/2018   HGBA1C 6.3 12/24/2017   HGBA1C 6.1 08/14/2017     Assessment & Plan   1. Chronic bilateral low back pain without sciatica X 3 months-minimal relief with methocarbamol.   - naproxen (NAPROSYN) 500 MG tablet; Take 1 tablet (500 mg total) by mouth 2 (two) times daily  with a meal. X 10 days then prn pain  Dispense: 60 tablet; Refill: 1 - Ambulatory referral to Orthopedic Surgery(if doesn't improve-can cancel appt if it improves) -stretching advised/ice/heat  2. Chronic bilateral thoracic back pain No change - naproxen (NAPROSYN) 500 MG tablet; Take 1 tablet (500 mg total) by mouth 2 (two) times daily with a meal. X 10 days then prn pain  Dispense: 60 tablet; Refill: 1 - Ambulatory referral to Orthopedic Surgery(if doesn't improve-can cancel appt if it improves) -stretching/heat/ice  3. Prediabetes Controlled. - POCT glucose (manual entry)  4. Muscle spasm - naproxen (NAPROSYN) 500 MG tablet; Take 1 tablet (500 mg total) by mouth 2 (two) times daily with a meal. X 10 days then prn pain  Dispense: 60 tablet; Refill: 1 - methocarbamol (ROBAXIN) 500 MG tablet; Take 1 tablet (500 mg total) by mouth 3 (three) times daily. X 10 days then prn muscle spasm  Dispense: 90 tablet; Refill: 1 - Ambulatory referral to Orthopedic Surgery  5. Essential hypertension Not at goal but also in pain.  Check blood pressure OOO 3 times per week and if consistently >135/85, f/up 3 weeks.  We have discussed target BP range and blood pressure goal. I have advised patient to check BP regularly and to call us back or report to clinic if the numbers are consistently higher than 140/90. We discussed the importance of compliance with medical therapy and DASH diet recommended, consequences of uncontrolled hypertension discussed.   Patient have been counseled extensively about nutrition and exercise  Return in about 6 months (around 01/16/2019) for assign new PCP; check prediabetes.  The patient was given clear instructions to go to ER or return to medical center if symptoms don't improve, worsen or new problems develop. The patient verbalized understanding. The patient was told to call to get lab results if they haven't heard anything in the next week.     Freeman Caldron, PA-C [facility] and [facility] [place], [place]   07/16/2018, 9:38 AM

## 2018-07-23 ENCOUNTER — Ambulatory Visit (INDEPENDENT_AMBULATORY_CARE_PROVIDER_SITE_OTHER): Payer: Medicaid Other | Admitting: Family Medicine

## 2018-07-23 ENCOUNTER — Encounter (INDEPENDENT_AMBULATORY_CARE_PROVIDER_SITE_OTHER): Payer: Self-pay | Admitting: Family Medicine

## 2018-07-23 DIAGNOSIS — M545 Low back pain, unspecified: Secondary | ICD-10-CM

## 2018-07-23 MED ORDER — ETODOLAC 400 MG PO TABS: 400.0000 mg | ORAL_TABLET | Freq: Two times a day (BID) | ORAL | 3 refills | Status: DC | PRN

## 2018-07-23 MED ORDER — TIZANIDINE HCL 2 MG PO TABS: 2.0000 mg | ORAL_TABLET | Freq: Four times a day (QID) | ORAL | 1 refills | Status: DC | PRN

## 2018-07-23 NOTE — Progress Notes (Signed)
   Office Visit Note   Patient: Sabrina Larson           Date of Birth: 12/09/1975           MRN: [id number] Visit Date: 07/23/2018 Requested by: Argentina Donovan, PA-C [place], [place] [address] PCP: Alfonse Spruce, FNP  Subjective: Chief Complaint  Patient presents with  . Lower Back - Pain    Pain across lower back and up to under shoulder blades.    HPI: She is here with thoracolumbar back pain.  Symptoms started about 2 months ago, no injury.  Gradual onset of pain worse when she is at home relaxing and better when she is being active.  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  She really enjoys her job, but she is not used to the physical activity.  Denies any radicular symptoms, no fevers, chills, night sweats.  She is not getting improvement with anti-inflammatories and muscle relaxants prescribed by her PCP.  He now presents for evaluation.  No personal or family history of back problems or rheumatologic disease.  Denies any urinary symptoms.              ROS: She has prediabetes and hypertension, all other systems were reviewed and are negative.  Objective: Vital Signs: There were no vitals taken for this visit.  Physical Exam:  Back: No visible rash.  Good flexibility with flexion, extension, side bending.  She has no bony tenderness along the thoracolumbar spinous processes.  She has very tight and tender right greater than left lower thoracic and upper lumbar paraspinous muscles.  No pain at the sacroiliac joints, straight leg raise is negative.  Lower extremity strength and reflexes are normal.  Imaging: None today.  Assessment & Plan: 1.  Thoracolumbar back pain, probably due to muscular strain -We elected to try physical therapy.  Anti-inflammatories and muscle relaxants as needed.  If no improvement then x-rays and possible additional imaging.   Follow-Up Instructions: No follow-ups on file.     Procedures: None today.   PMFS History: Patient Active Problem List   Diagnosis Date Noted  . History of gastric ulcer 12/24/2017  . GERD (gastroesophageal reflux disease) 01/22/2017  . Acute conjunctivitis of both eyes 01/22/2017  . Hypertension 07/05/2016  . Prediabetes 05/02/2016   Past Medical History:  Diagnosis Date  . GERD (gastroesophageal reflux disease)   . Hypertension   . Prediabetes     Family History  Problem Relation Age of Onset  . Asthma Mother   . Hypertension Mother   . Asthma Brother     Past Surgical History:  Procedure Laterality Date  . CESAREAN SECTION     2014, 1995  . DILATION AND CURETTAGE OF UTERUS     8 total in office and outpatient  . SKIN GRAFT     at age 31   Social History   Occupational History  . Not on file  Tobacco Use  . Smoking status: Current Every Day Smoker    Packs/day: 0.50  . Smokeless tobacco: Never Used  Substance and Sexual Activity  . Alcohol use: Yes    Alcohol/week: 1.0 - 2.0 standard drinks    Types: 1 - 2 Glasses of wine per week    Comment: occasional  . Drug use: No  . Sexual activity: Not Currently

## 2018-07-25 ENCOUNTER — Other Ambulatory Visit (INDEPENDENT_AMBULATORY_CARE_PROVIDER_SITE_OTHER): Payer: Self-pay | Admitting: Family Medicine

## 2018-07-25 MED ORDER — NABUMETONE 500 MG PO TABS: 500.0000 mg | ORAL_TABLET | Freq: Two times a day (BID) | ORAL | 3 refills | Status: DC | PRN

## 2018-08-07 ENCOUNTER — Ambulatory Visit: Payer: Medicaid Other | Attending: Physician Assistant | Admitting: Physical Therapy

## 2018-08-07 ENCOUNTER — Encounter: Payer: Self-pay | Admitting: Physical Therapy

## 2018-08-07 ENCOUNTER — Other Ambulatory Visit: Payer: Self-pay

## 2018-08-07 DIAGNOSIS — M6281 Muscle weakness (generalized): Secondary | ICD-10-CM

## 2018-08-07 DIAGNOSIS — M546 Pain in thoracic spine: Secondary | ICD-10-CM | POA: Diagnosis not present

## 2018-08-07 DIAGNOSIS — M545 Low back pain, unspecified: Secondary | ICD-10-CM

## 2018-08-07 DIAGNOSIS — G8929 Other chronic pain: Secondary | ICD-10-CM | POA: Diagnosis not present

## 2018-08-07 NOTE — Patient Instructions (Signed)
      TENS UNIT  This is helpful for muscle pain and spasm.   Search and Purchase a TENS 7000 2nd edition at www.tenspros.com or www.amazon.com  (It should be less than $30)     TENS unit instructions:   Do not shower or bathe with the unit on  Turn the unit off before removing electrodes or batteries  If the electrodes lose stickiness add a drop of water to the electrodes after they are disconnected from the unit and place on plastic sheet. If you continued to have difficulty, call the TENS unit company to purchase more electrodes.  Do not apply lotion on the skin area prior to use. Make sure the skin is clean and dry as this will help prolong the life of the electrodes.  After use, always check skin for unusual red areas, rash or other skin difficulties. If there are any skin problems, does not apply electrodes to the same area.  Never remove the electrodes from the unit by pulling the wires.  Do not use the TENS unit or electrodes other than as directed.  Do not change electrode placement without consulting your therapist or physician.  Keep 2 fingers with between each electrode.   Access Code: [email or web address]  URL: [email or web address]  Date: 08/07/2018  Prepared by: Ruben Im   Exercises  Prone Press Up - 10 reps - 2 sets - 5x daily - 7x weekly  Standing Back Extension - 10 reps - 1 sets - 5x daily - 7x weekly  Bird Dog - 10 reps - 3 sets - 1x daily - 7x weekly  Ardine Eng Pose - 3 reps - 1 sets - 1x daily - 7x weekly

## 2018-08-07 NOTE — Therapy (Signed)
[facility] [address] [place], [place], [address] Phone: [phone]   Fax:  [phone]  Physical Therapy Evaluation  Patient Details  Name: Sabrina Larson MRN: [id number] Date of Birth: Mar 15, 1976 Referring Provider: Dr. Junius Roads   Encounter Date: 08/07/2018  PT End of Session - 08/07/18 1457    Visit Number  1    Date for PT Re-Evaluation  10/02/18    Authorization Type  Medicaid submitted for approval     PT Start Time  1154   pt late   PT Stop Time  1230    PT Time Calculation (min)  36 min    Activity Tolerance  Patient tolerated treatment well       Past Medical History:  Diagnosis Date  . GERD (gastroesophageal reflux disease)   . Hypertension   . Prediabetes     Past Surgical History:  Procedure Laterality Date  . CESAREAN SECTION     2014, 1995  . DILATION AND CURETTAGE OF UTERUS     8 total in office and outpatient  . SKIN GRAFT     at age 41    There were no vitals filed for this visit.   Subjective Assessment - 08/07/18 1159    Subjective  Back pain as the result from job duties pushing, pulling.  Lower back and under shoulder blades.  Periodic sharper pain top of right hip.   Started in last 3 months.      Pertinent History  diabetes     Limitations  Lifting;House hold activities    How long can you stand comfortably?  I stand all day at work     Diagnostic tests  not yet    Patient Stated Goals  no back pain     Currently in Pain?  Yes    Pain Score  7     Pain Location  Back    Pain Type  Chronic pain    Aggravating Factors   lifting packages at work     Pain Relieving Factors  heating pad; having 6 and 42 year old walk on my back          [facility] PT Assessment - 08/07/18 0001      Assessment   Medical Diagnosis  thoracolumbar pain     Referring Provider  Dr. Junius Roads    Onset Date/Surgical Date  --   3 months ago    Next MD Visit  as needed     Prior Therapy  no      Precautions   Precautions  None      Restrictions   Weight Bearing Restrictions  No      Balance Screen   Has the patient fallen in the past 6 months  No    Has the patient had a decrease in activity level because of a fear of falling?   No    Is the patient reluctant to leave their home because of a fear of falling?   No      Home Film/video editor residence      Prior Function   Level of Independence  Independent    Vocation  Full time employment    Vocation Requirements  post office    Leisure  traveling       Posture/Postural Control   Posture/Postural Control  Postural limitations    Postural Limitations  Decreased lumbar lordosis      AROM  AROM Assessment Site  --   No clear directional preference with repeated movements    Lumbar Flexion  50    Lumbar Extension  28    Lumbar - Right Side Bend  25    Lumbar - Left Side Bend  35    Lumbar - Right Rotation  40    Lumbar - Left Rotation  40      Strength   Strength Assessment Site  --   decreased motor control with bird dog   Right Hip Extension  4/5    Right Hip ABduction  4/5    Left Hip Extension  4/5    Left Hip ABduction  4/5    Lumbar Flexion  4-/5   decreased activation of transverse abdominus   Lumbar Extension  4-/5      Flexibility   Soft Tissue Assessment /Muscle Length  yes    Hamstrings  WFLs      Palpation   Palpation comment  tender points right rhomboid       Slump test   Findings  Negative      Straight Leg Raise   Findings  Negative                Objective measurements completed on examination: See above findings.              PT Education - 08/07/18 1456    Education Details  trial of prone press ups, standing extensions, bird dogs,  childs pose, basic body mechanics education;  TENs unit info     Person(s) Educated  Patient    Methods  Demonstration;Explanation;Handout    Comprehension  Returned demonstration;Verbalized  understanding       PT Short Term Goals - 08/07/18 1509      PT SHORT TERM GOAL #1   Title  The patient will demonstrate basic understanding of lifting principles    Time  4    Period  Weeks    Status  New    Target Date  09/04/18      PT SHORT TERM GOAL #2   Title  The patient will report a 30% reduction in thoracolumbar pain with home and work ADLs    Time  4    Period  Weeks    Status  New      PT SHORT TERM GOAL #3   Title  The patient will have improved lumbar flexion to 60 degrees and right sidebending to 30 degrees needed for work duties    Time  4    Period  Weeks    Status  New        PT Long Term Goals - 08/07/18 1511      PT LONG TERM GOAL #1   Title  The patient will be independent in safe self progression of HEP needed for further core strengthening    Time  8    Period  Weeks    Status  New    Target Date  10/02/18      PT LONG TERM GOAL #2   Title  The patient will report a 60% improvement in thoracolumbar pain with home and work ADLs    Time  8    Period  Weeks    Status  New      PT LONG TERM GOAL #3   Title  The patient will have improved thoraco/lumbar strength to 4/5 needed for lifting at work    Time  8  Period  Weeks    Status  New      PT LONG TERM GOAL #4   Title  The patient will have bilateral hip abduction and extension to 4+/5 needed for standing at work for long periods of time    Time  8    Period  Weeks    Status  New      PT LONG TERM GOAL #5   Title  The patient will have thoracolumbar ROM to [facility] with minimal pain needed for home and work ADLs    Time  8    Period  [place] - 08/07/18 1227    Clinical Impression Statement  The patient complains of a 3 month history of lower back pain and mid back pain which 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  She has painful and limited lumbar and thoracic ROM in most planes.  No clear  directional preference with repeated movement testing.   No neural signs.  Decreased lumbar lordosis.  Decreased hip abduction and extension strength bilaterally.  Decreased trunk strength and activation of transverse abdominus and multifidi muscles.  Tender points in right rhomboids and thoracic paraspinals.   Fair knowledge of basic body mechanics but would benefit from further training on lifting principles.   She would benefit from PT to address all these deficits.      Clinical Presentation  Stable    Clinical Decision Making  Low    Rehab Potential  Good    PT Frequency  2x / week    PT Duration  8 weeks    PT Treatment/Interventions  ADLs/Self Care Home Management;Electrical Stimulation;Cryotherapy;Ultrasound;Traction;Moist Heat;Therapeutic activities;Therapeutic exercise;Patient/family education;Neuromuscular re-education;Manual techniques;Dry needling;Taping    PT Next Visit Plan  assess response to extension bias ex;  start abdominal brace core;  scapular band ex;  try ES/heat as needed for pain;  manual techniques ;  KT for postural support    PT Home Exercise Plan   Access Code: [email or web address]     Consulted and Agree with Plan of Care  Patient       Patient will benefit from skilled therapeutic intervention in order to improve the following deficits and impairments:  Pain, Improper body mechanics, Increased fascial restricitons, Postural dysfunction, Decreased range of motion, Decreased strength  Visit Diagnosis: Chronic bilateral low back pain without sciatica - Plan: PT plan of care cert/re-cert  Pain in thoracic spine - Plan: PT plan of care cert/re-cert  Muscle weakness (generalized) - Plan: PT plan of care cert/re-cert     Problem List Patient Active Problem List   Diagnosis Date Noted  . History of gastric ulcer 12/24/2017  . GERD (gastroesophageal reflux disease) 01/22/2017  . Acute conjunctivitis of both eyes 01/22/2017  . Hypertension 07/05/2016  . Prediabetes  05/02/2016   Ruben Im, PT 08/07/18 3:20 PM Phone: [phone] Fax: [phone]  Alvera Singh 08/07/2018, 3:20 PM  [facility] [address] [place], [place], [address] Phone: [phone]   Fax:  [phone]  Name: Sabrina Larson MRN: [id number] Date of Birth: [birth year]

## 2018-08-14 ENCOUNTER — Ambulatory Visit: Payer: Medicaid Other | Admitting: Physical Therapy

## 2018-08-14 ENCOUNTER — Encounter: Payer: Self-pay | Admitting: Physical Therapy

## 2018-08-14 DIAGNOSIS — M545 Low back pain: Secondary | ICD-10-CM | POA: Diagnosis not present

## 2018-08-14 DIAGNOSIS — G8929 Other chronic pain: Secondary | ICD-10-CM

## 2018-08-14 DIAGNOSIS — M6281 Muscle weakness (generalized): Secondary | ICD-10-CM | POA: Diagnosis not present

## 2018-08-14 DIAGNOSIS — M546 Pain in thoracic spine: Secondary | ICD-10-CM | POA: Diagnosis not present

## 2018-08-14 NOTE — Patient Instructions (Signed)
Access Code: [email or web address]  URL: [email or web address]  Date: 08/14/2018  Prepared by: Ruben Im   Exercises  Prone Press Up - 10 reps - 2 sets - 5x daily - 7x weekly  Standing Back Extension - 10 reps - 1 sets - 5x daily - 7x weekly  Bird Dog - 10 reps - 3 sets - 1x daily - 7x weekly  Ardine Eng Pose - 3 reps - 1 sets - 1x daily - 7x weekly  Supine Bent Leg Lift with Knee Extension - 10 reps - 1 sets - 1x daily - 7x weekly  Hooklying Isometric Hip Flexion - 10 reps - 1 sets - 1x daily - 7x weekly  Supine Shoulder Horizontal Abduction with Resistance - 10 reps - 1 sets - 1x daily - 7x weekly  Supine PNF D2 Flexion with Resistance - 10 reps - 1 sets - 1x daily - 7x weekly  Supine Shoulder External Rotation with Resistance - 10 reps - 1 sets - 1x daily - 7x weekly  Cat Cow - 10 reps - 1 sets - 1x daily - 7x weekly      [facility] [address] [place] [employer] [place] [employer], [place] [address] Phone # [phone] Fax [phone]

## 2018-08-14 NOTE — Therapy (Addendum)
[facility] [address] [place], [place], [address] Phone: [phone]   Fax:  [phone]  Physical Therapy Treatment/Discharge Summary   Patient Details  Name: Sabrina Larson MRN: [id number] Date of Birth: 25-Oct-1976 Referring Provider: Dr. Junius Roads   Encounter Date: 08/14/2018  PT End of Session - 08/14/18 0838    Visit Number  2    Number of Visits  4    Date for PT Re-Evaluation  10/02/18    Authorization Type  Medicaid Eval plus 3 visits approved 08/13/18 to 09/02/18    PT Start Time  0803    PT Stop Time  0845    PT Time Calculation (min)  42 min    Activity Tolerance  Patient tolerated treatment well       Past Medical History:  Diagnosis Date  . GERD (gastroesophageal reflux disease)   . Hypertension   . Prediabetes     Past Surgical History:  Procedure Laterality Date  . CESAREAN SECTION     2014, 1995  . DILATION AND CURETTAGE OF UTERUS     8 total in office and outpatient  . SKIN GRAFT     at age 42    There were no vitals filed for this visit.  Subjective Assessment - 08/14/18 0804    Subjective  Tired from working all night.   Lower back extending  to under shoulder blades.    Currently in Pain?  Yes    Pain Score  6     Pain Location  Back    Pain Orientation  Mid                       [facility] Adult PT Treatment/Exercise - 08/14/18 0001      Lumbar Exercises: Supine   Ab Set  5 reps    Bent Knee Raise  5 reps    Isometric Hip Flexion  5 reps      Lumbar Exercises: Prone   Other Prone Lumbar Exercises  press ups 10x   complains of pain in lumbar region     Lumbar Exercises: Quadruped   Madcat/Old Horse  10 reps      Shoulder Exercises: Supine   Horizontal ABduction  Strengthening;Both;10 reps;Theraband    Theraband Level (Shoulder Horizontal ABduction)  Level 2 (Red)    External Rotation  Strengthening;Both;10 reps    Theraband Level (Shoulder External  Rotation)  Level 2 (Red)    Flexion  Strengthening;Both;10 reps;Theraband    Theraband Level (Shoulder Flexion)  Level 2 (Red)    Diagonals  Strengthening;Both;5 reps;Theraband    Theraband Level (Shoulder Diagonals)  Level 2 (Red)      Moist Heat Therapy   Number Minutes Moist Heat  15 Minutes    Moist Heat Location  Lumbar Spine;Other (comment)   thoracic     Electrical Stimulation   Electrical Stimulation Location  bil thoracic and lumbar    Electrical Stimulation Action  IFC    Electrical Stimulation Parameters  11 ma 15 min prone    Electrical Stimulation Goals  Pain             PT Education - 08/14/18 0837    Education Details   Access Code: [email or web address]  cat/cow;  abdominal brace, hand to knee push; bent knee raises;  supine scapular band ex's with red band     Person(s) Educated  Patient    Methods  Explanation;Demonstration;Handout    Comprehension  Verbalized understanding;Returned demonstration       PT Short Term Goals - 08/07/18 1509      PT SHORT TERM GOAL #1   Title  The patient will demonstrate basic understanding of lifting principles    Time  4    Period  Weeks    Status  New    Target Date  09/04/18      PT SHORT TERM GOAL #2   Title  The patient will report a 30% reduction in thoracolumbar pain with home and work ADLs    Time  4    Period  Weeks    Status  New      PT SHORT TERM GOAL #3   Title  The patient will have improved lumbar flexion to 60 degrees and right sidebending to 30 degrees needed for work duties    Time  4    Period  Weeks    Status  New        PT Long Term Goals - 08/07/18 1511      PT LONG TERM GOAL #1   Title  The patient will be independent in safe self progression of HEP needed for further core strengthening    Time  8    Period  Weeks    Status  New    Target Date  10/02/18      PT LONG TERM GOAL #2   Title  The patient will report a 60% improvement in thoracolumbar pain with home and work ADLs    Time  8     Period  Weeks    Status  New      PT LONG TERM GOAL #3   Title  The patient will have improved thoraco/lumbar strength to 4/5 needed for lifting at work    Time  8    Period  Weeks    Status  New      PT LONG TERM GOAL #4   Title  The patient will have bilateral hip abduction and extension to 4+/5 needed for standing at work for long periods of time    Time  8    Period  Weeks    Status  New      PT LONG TERM GOAL #5   Title  The patient will have thoracolumbar ROM to [facility] with minimal pain needed for home and work ADLs    Time  8    Period  [place] - 08/14/18 7846    Clinical Impression Statement  No clear directional preference.  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  Discussed the importance of strengthening core muscles to help with these work tasks.  Good relief from ES/heat.  Therapist monitoring pain response and providing verbal cues for transverse abdominus muscle activation and coordinated breathing.      Rehab Potential  Good    PT Frequency  2x / week    PT Duration  8 weeks    PT Treatment/Interventions  ADLs/Self Care Home Management;Electrical Stimulation;Cryotherapy;Ultrasound;Traction;Moist Heat;Therapeutic activities;Therapeutic exercise;Patient/family education;Neuromuscular re-education;Manual techniques;Dry needling;Taping    PT Next Visit Plan   abdominal brace core;  scapular band ex;  try foam roll;   thoracic extension;  ES/heat as needed for pain;  manual techniques including soft tissue work to  lumbar and thoracic paraspinals; try  KT for postural support;  quad and hip strengthening to aid body mechanics for lifting    PT Home Exercise Plan   Access Code: [email or web address]        Patient will benefit from skilled therapeutic intervention in order to improve the following deficits and impairments:  Pain, Improper body  mechanics, Increased fascial restricitons, Postural dysfunction, Decreased range of motion, Decreased strength  Visit Diagnosis: Chronic bilateral low back pain without sciatica  Pain in thoracic spine  Muscle weakness (generalized)    PHYSICAL THERAPY DISCHARGE SUMMARY  Visits from Start of Care: 2  Current functional level related to goals / functional outcomes: The patient has not returned to PT since 08/14/18 and her Medicaid authorization ended on 09/25/18.  She will need a new PT order if services are needed.    Remaining deficits: As above   Education / Equipment: Basic HEP Plan: Patient agrees to discharge.  Patient goals were not met. Patient is being discharged due to not returning since the last visit.  ?????         Problem List Patient Active Problem List   Diagnosis Date Noted  . History of gastric ulcer 12/24/2017  . GERD (gastroesophageal reflux disease) 01/22/2017  . Acute conjunctivitis of both eyes 01/22/2017  . Hypertension 07/05/2016  . Prediabetes 05/02/2016   Ruben Im, PT 08/14/18 9:00 AM Phone: [phone] Fax: [phone] Alvera Singh 08/14/2018, 8:59 AM  [facility] [address], [place], [place], [address] Phone: [phone]   Fax:  [phone]  Name: Atlantis Delong MRN: [id number] Date of Birth: 09/29/1976

## 2018-08-19 ENCOUNTER — Encounter: Payer: Medicaid Other | Admitting: Physical Therapy

## 2018-08-21 ENCOUNTER — Telehealth: Payer: Self-pay | Admitting: General Practice

## 2018-08-21 NOTE — Telephone Encounter (Signed)
Patient called to request a letter for work. She needs it to state that she can't work due to the medications she is currently on, which cause drowsiness. Please follow up and give patient a call if this letter can be written or not.

## 2018-08-22 NOTE — Telephone Encounter (Signed)
Pt encouraged to come in for an OV or contact Orthopedic Specialist for note/ appointment. She state she is on muscle relaxant and when she does not take them her back hurt terribly and when she takes them she can not function at work.

## 2018-08-26 ENCOUNTER — Ambulatory Visit: Payer: Medicaid Other | Attending: Nurse Practitioner | Admitting: Nurse Practitioner

## 2018-08-26 ENCOUNTER — Encounter: Payer: Self-pay | Admitting: Nurse Practitioner

## 2018-08-26 VITALS — BP 141/95 | HR 88 | Temp 99.2°F | Ht 66.0 in | Wt 194.4 lb

## 2018-08-26 DIAGNOSIS — Z8249 Family history of ischemic heart disease and other diseases of the circulatory system: Secondary | ICD-10-CM | POA: Diagnosis not present

## 2018-08-26 DIAGNOSIS — Z79899 Other long term (current) drug therapy: Secondary | ICD-10-CM | POA: Diagnosis not present

## 2018-08-26 DIAGNOSIS — Z6830 Body mass index (BMI) 30.0-30.9, adult: Secondary | ICD-10-CM | POA: Diagnosis not present

## 2018-08-26 DIAGNOSIS — I10 Essential (primary) hypertension: Secondary | ICD-10-CM | POA: Diagnosis not present

## 2018-08-26 DIAGNOSIS — Z1231 Encounter for screening mammogram for malignant neoplasm of breast: Secondary | ICD-10-CM | POA: Diagnosis not present

## 2018-08-26 DIAGNOSIS — R030 Elevated blood-pressure reading, without diagnosis of hypertension: Secondary | ICD-10-CM

## 2018-08-26 DIAGNOSIS — K219 Gastro-esophageal reflux disease without esophagitis: Secondary | ICD-10-CM | POA: Diagnosis not present

## 2018-08-26 DIAGNOSIS — Z23 Encounter for immunization: Secondary | ICD-10-CM

## 2018-08-26 DIAGNOSIS — R7303 Prediabetes: Secondary | ICD-10-CM | POA: Diagnosis not present

## 2018-08-26 DIAGNOSIS — E669 Obesity, unspecified: Secondary | ICD-10-CM | POA: Diagnosis not present

## 2018-08-26 LAB — GLUCOSE, POCT (MANUAL RESULT ENTRY): POC Glucose: 103 mg/dl — AB (ref 70–99)

## 2018-08-26 NOTE — Progress Notes (Signed)
Assessment & Plan:  Dezaray was seen today for establish care.  Diagnoses and all orders for this visit:  Elevated blood pressure reading Exercise at least 30 minutes per day 5 days per week DASH/Mediterranean Diets are healthier choices for HTN.     Prediabetes -     Glucose (CBG) -     Microalbumin/Creatinine Ratio, Urine Continue blood sugar control as discussed in office today, low carbohydrate diet, and regular physical exercise as tolerated, 150 minutes per week (30 min each day, 5 days per week, or 50 min 3 days per week). Keep blood sugar logs with fasting goal of 90-130 mg/dl, post prandial (after you eat) less than 180.  For Hypoglycemia: BS <60 and Hyperglycemia BS >400; contact the clinic ASAP. Annual eye exams and foot exams are recommended.   Obesity (BMI 30-39.9) Discussed diet and exercise for person with BMI >25. Instructed: You must burn more calories than you eat. Losing 5 percent of your body weight should be considered a success. In the longer term, losing more than 15 percent of your body weight and staying at this weight is an extremely good result. However, keep in mind that even losing 5 percent of your body weight leads to important health benefits, so try not to get discouraged if you're not able to lose more than this. Will recheck weight in 3-6 months.  Breast cancer screening by mammogram -     MM 3D SCREEN BREAST BILATERAL; Future  Need for immunization against influenza -     Flu Vaccine QUAD 36+ mos IM    Patient has been counseled on age-appropriate routine health concerns for screening and prevention. These are reviewed and up-to-date. Referrals have been placed accordingly. Immunizations are up-to-date or declined.    Subjective:   Chief Complaint  Patient presents with  . Establish Care    Pt. is here to establish care.    HPI Sabrina Larson 42 y.o. female presents to office today to establish care. She has a history of hypertension  several years ago when she was pregnant but reports her hypertension resolved after her pregnancy. Her blood pressure is elevated today however she declines starting an antihypertensive and would like to try diet and exercise for the next several weeks to help lower her blood pressure prior to starting any medication.  BP Readings from Last 3 Encounters:  08/26/18 (!) 141/95  07/16/18 (!) 141/97  06/19/18 135/90   Prediabetes Well controlled. She denies any hypo or hyperglycemic symptoms. She does not monitor her blood glucose levels at home. Prediabetes is controlled with metformin 500 mg BID. She endorses medication compliance.   Lab Results  Component Value Date   HGBA1C 5.8 (H) 06/19/2018   Review of Systems  Constitutional: Negative for fever, malaise/fatigue and weight loss.  HENT: Negative.  Negative for nosebleeds.   Eyes: Negative.  Negative for blurred vision, double vision and photophobia.  Respiratory: Negative.  Negative for cough and shortness of breath.   Cardiovascular: Negative.  Negative for chest pain, palpitations and leg swelling.  Gastrointestinal: Negative.  Negative for heartburn, nausea and vomiting.  Musculoskeletal: Negative.  Negative for myalgias.  Neurological: Negative.  Negative for dizziness, focal weakness, seizures and headaches.  Psychiatric/Behavioral: Negative.  Negative for suicidal ideas.    Past Medical History:  Diagnosis Date  . GERD (gastroesophageal reflux disease)   . Hypertension   . Prediabetes     Past Surgical History:  Procedure Laterality Date  . CESAREAN SECTION  2014, 1995  . DILATION AND CURETTAGE OF UTERUS     8 total in office and outpatient  . SKIN GRAFT     at age 23    Family History  Problem Relation Age of Onset  . Asthma Mother   . Hypertension Mother   . Asthma Brother     Social History Reviewed with no changes to be made today.   Outpatient Medications Prior to Visit  Medication Sig Dispense Refill   . etodolac (LODINE) 400 MG tablet Take 1 tablet (400 mg total) by mouth 2 (two) times daily as needed. 60 tablet 3  . famotidine (PEPCID) 20 MG tablet Take 1 tablet (20 mg total) by mouth 2 (two) times daily before a meal. 60 tablet 5  . metFORMIN (GLUCOPHAGE) 500 MG tablet TAKE 1 TABLET(500 MG) BY MOUTH TWICE DAILY WITH A MEAL 60 tablet 5  . methocarbamol (ROBAXIN) 500 MG tablet Take 1 tablet (500 mg total) by mouth 3 (three) times daily. X 10 days then prn muscle spasm 90 tablet 1  . naproxen (NAPROSYN) 500 MG tablet Take 1 tablet (500 mg total) by mouth 2 (two) times daily with a meal. X 10 days then prn pain 60 tablet 1  . Cetirizine HCl 10 MG CAPS Take 1 capsule (10 mg total) by mouth daily for 15 days. 15 capsule 0  . fluticasone (FLONASE) 50 MCG/ACT nasal spray Place 1-2 sprays into both nostrils daily for 7 days. 1 g 0  . Multiple Vitamins-Minerals (MULTIVITAMIN WITH MINERALS) tablet Take 1 tablet by mouth daily.    . nabumetone (RELAFEN) 500 MG tablet Take 1 tablet (500 mg total) by mouth 2 (two) times daily as needed. (Patient not taking: Reported on 08/26/2018) 60 tablet 3  . olopatadine (PATANOL) 0.1 % ophthalmic solution Place 1 drop into the left eye 2 (two) times daily. Prn eye allergies (Patient not taking: Reported on 08/26/2018) 5 mL 12  . tiZANidine (ZANAFLEX) 2 MG tablet Take 1-2 tablets (2-4 mg total) by mouth every 6 (six) hours as needed for muscle spasms. (Patient not taking: Reported on 08/26/2018) 60 tablet 1  . triamcinolone ointment (KENALOG) 0.5 % Apply 1 application topically 2 (two) times daily. (Patient not taking: Reported on 08/26/2018) 30 g 0   No facility-administered medications prior to visit.     No Known Allergies     Objective:    BP (!) 141/95 (BP Location: Left Arm, Patient Position: Sitting, Cuff Size: Normal)   Pulse 88   Temp 99.2 F (37.3 C) (Oral)   Ht 5\' 6"  (1.676 m)   Wt 194 lb 6.4 oz (88.2 kg)   LMP 08/03/2018   SpO2 94%   BMI 31.38 kg/m    Wt Readings from Last 3 Encounters:  08/26/18 194 lb 6.4 oz (88.2 kg)  07/16/18 192 lb 12.8 oz (87.5 kg)  06/19/18 195 lb (88.5 kg)    Physical Exam  Constitutional: She is oriented to person, place, and time. She appears well-developed and well-nourished. She is cooperative.  HENT:  Head: Normocephalic and atraumatic.  Eyes: EOM are normal.  Neck: Normal range of motion.  Cardiovascular: Normal rate, regular rhythm and normal heart sounds. Exam reveals no gallop and no friction rub.  No murmur heard. Pulmonary/Chest: Effort normal and breath sounds normal. No tachypnea. No respiratory distress. She has no decreased breath sounds. She has no wheezes. She has no rhonchi. She has no rales. She exhibits no tenderness.  Abdominal: Bowel sounds are normal.  Musculoskeletal: Normal range of motion. She exhibits no edema.  Neurological: She is alert and oriented to person, place, and time. Coordination normal.  Skin: Skin is warm and dry.  Psychiatric: She has a normal mood and affect. Her behavior is normal. Judgment and thought content normal.  Nursing note and vitals reviewed.      Patient has been counseled extensively about nutrition and exercise as well as the importance of adherence with medications and regular follow-up. The patient was given clear instructions to go to ER or return to medical center if symptoms don't improve, worsen or new problems develop. The patient verbalized understanding.   Follow-up: Return in about 8 weeks (around 10/21/2018) for BP recheck.   Gildardo Pounds, FNP-BC [facility] and [place] [place], [place]   08/27/2018, 11:38 PM

## 2018-08-26 NOTE — Patient Instructions (Addendum)
[facility] D[facility] Address: [address] [place], [place] [address] Phone: [phone]    DASH Eating Plan Dolores stands for "Dietary Approaches to Stop Hypertension." The DASH eating plan is a healthy eating plan that has been shown to reduce high blood pressure (hypertension). It may also reduce your risk for type 2 diabetes, heart disease, and stroke. The DASH eating plan may also help with weight loss. What are tips for following this plan? General guidelines  Avoid eating more than 2,300 mg (milligrams) of salt (sodium) a day. If you have hypertension, you may need to reduce your sodium intake to 1,500 mg a day.  Limit alcohol intake to no more than 1 drink a day for nonpregnant women and 2 drinks a day for men. One drink equals 12 oz of beer, 5 oz of wine, or 1 oz of hard liquor.  Work with your health care provider to maintain a healthy body weight or to lose weight. Ask what an ideal weight is for you.  Get at least 30 minutes of exercise that causes your heart to beat faster (aerobic exercise) most days of the week. Activities may include walking, swimming, or biking.  Work with your health care provider or diet and nutrition specialist (dietitian) to adjust your eating plan to your individual calorie needs. Reading food labels  Check food labels for the amount of sodium per serving. Choose foods with less than 5 percent of the Daily Value of sodium. Generally, foods with less than 300 mg of sodium per serving fit into this eating plan.  To find whole grains, look for the word "whole" as the first word in the ingredient list. Shopping  Buy products labeled as "low-sodium" or "no salt added."  Buy fresh foods. Avoid canned foods and premade or frozen meals. Cooking  Avoid adding salt when cooking. Use salt-free seasonings or herbs instead of table salt or sea salt. Check with your health care provider or pharmacist before using salt  substitutes.  Do not fry foods. Cook foods using healthy methods such as baking, boiling, grilling, and broiling instead.  Cook with heart-healthy oils, such as olive, canola, soybean, or sunflower oil. Meal planning   Eat a balanced diet that includes: ? 5 or more servings of fruits and vegetables each day. At each meal, try to fill half of your plate with fruits and vegetables. ? Up to 6-8 servings of whole grains each day. ? Less than 6 oz of lean meat, poultry, or fish each day. A 3-oz serving of meat is about the same size as a deck of cards. One egg equals 1 oz. ? 2 servings of low-fat dairy each day. ? A serving of nuts, seeds, or beans 5 times each week. ? Heart-healthy fats. Healthy fats called Omega-3 fatty acids are found in foods such as flaxseeds and coldwater fish, like sardines, salmon, and mackerel.  Limit how much you eat of the following: ? Canned or prepackaged foods. ? Food that is high in trans fat, such as fried foods. ? Food that is high in saturated fat, such as fatty meat. ? Sweets, desserts, sugary drinks, and other foods with added sugar. ? Full-fat dairy products.  Do not salt foods before eating.  Try to eat at least 2 vegetarian meals each week.  Eat more home-cooked food and less restaurant, buffet, and fast food.  When eating at a restaurant, ask that your food be prepared with less salt or no salt, if possible. What  foods are recommended? The items listed may not be a complete list. Talk with your dietitian about what dietary choices are best for you. Grains Whole-grain or whole-wheat bread. Whole-grain or whole-wheat pasta. Brown rice. [place]. Bulgur. Whole-grain and low-sodium cereals. Pita bread. Low-fat, low-sodium crackers. Whole-wheat flour tortillas. Vegetables Fresh or frozen vegetables (raw, steamed, roasted, or grilled). Low-sodium or reduced-sodium tomato and vegetable juice. Low-sodium or reduced-sodium tomato sauce and tomato  paste. Low-sodium or reduced-sodium canned vegetables. Fruits All fresh, dried, or frozen fruit. Canned fruit in natural juice (without added sugar). Meat and other protein foods Skinless chicken or [place]. Ground chicken or [place]. Pork with fat trimmed off. Fish and seafood. Egg whites. Dried beans, peas, or lentils. Unsalted nuts, nut butters, and seeds. Unsalted canned beans. Lean cuts of beef with fat trimmed off. Low-sodium, lean deli meat. Dairy Low-fat (1%) or fat-free (skim) milk. Fat-free, low-fat, or reduced-fat cheeses. Nonfat, low-sodium ricotta or cottage cheese. Low-fat or nonfat yogurt. Low-fat, low-sodium cheese. Fats and oils Soft margarine without trans fats. Vegetable oil. Low-fat, reduced-fat, or light mayonnaise and salad dressings (reduced-sodium). Canola, safflower, olive, soybean, and sunflower oils. Avocado. Seasoning and other foods Herbs. Spices. Seasoning mixes without salt. Unsalted popcorn and pretzels. Fat-free sweets. What foods are not recommended? The items listed may not be a complete list. Talk with your dietitian about what dietary choices are best for you. Grains Baked goods made with fat, such as croissants, muffins, or some breads. Dry pasta or rice meal packs. Vegetables Creamed or fried vegetables. Vegetables in a cheese sauce. Regular canned vegetables (not low-sodium or reduced-sodium). Regular canned tomato sauce and paste (not low-sodium or reduced-sodium). Regular tomato and vegetable juice (not low-sodium or reduced-sodium). Angie Fava. Olives. Fruits Canned fruit in a light or heavy syrup. Fried fruit. Fruit in cream or butter sauce. Meat and other protein foods Fatty cuts of meat. Ribs. Fried meat. Berniece Salines. Sausage. Bologna and other processed lunch meats. Salami. Fatback. Hotdogs. Bratwurst. Salted nuts and seeds. Canned beans with added salt. Canned or smoked fish. Whole eggs or egg yolks. Chicken or [place] with skin. Dairy Whole or 2% milk,  cream, and half-and-half. Whole or full-fat cream cheese. Whole-fat or sweetened yogurt. Full-fat cheese. Nondairy creamers. Whipped toppings. Processed cheese and cheese spreads. Fats and oils Butter. Stick margarine. Lard. Shortening. Ghee. Bacon fat. Tropical oils, such as coconut, palm kernel, or palm oil. Seasoning and other foods Salted popcorn and pretzels. Onion salt, garlic salt, seasoned salt, table salt, and sea salt. Worcestershire sauce. Tartar sauce. Barbecue sauce. Teriyaki sauce. Soy sauce, including reduced-sodium. Steak sauce. Canned and packaged gravies. Fish sauce. Oyster sauce. Cocktail sauce. Horseradish that you find on the shelf. Ketchup. Mustard. Meat flavorings and tenderizers. Bouillon cubes. Hot sauce and Tabasco sauce. Premade or packaged marinades. Premade or packaged taco seasonings. Relishes. Regular salad dressings. Where to find more information:  National Heart, Lung, and [place]: https://wilson-eaton.com/  American Heart Association: www.heart.org Summary  The DASH eating plan is a healthy eating plan that has been shown to reduce high blood pressure (hypertension). It may also reduce your risk for type 2 diabetes, heart disease, and stroke.  With the DASH eating plan, you should limit salt (sodium) intake to 2,300 mg a day. If you have hypertension, you may need to reduce your sodium intake to 1,500 mg a day.  When on the DASH eating plan, aim to eat more fresh fruits and vegetables, whole grains, lean proteins, low-fat dairy, and heart-healthy fats.  Work with  your health care provider or diet and nutrition specialist (dietitian) to adjust your eating plan to your individual calorie needs. This information is not intended to replace advice given to you by your health care provider. Make sure you discuss any questions you have with your health care provider. Document Released: 11/08/2011 Document Revised: 11/12/2016 Document Reviewed: 11/12/2016 Elsevier  Interactive Patient Education  2018 [place] for [employer] Calories are units of energy. Your body needs a certain amount of calories from food to keep you going throughout the day. When you eat more calories than your body needs, your body stores the extra calories as fat. When you eat fewer calories than your body needs, your body burns fat to get the energy it needs. Calorie counting means keeping track of how many calories you eat and drink each day. Calorie counting can be helpful if you need to lose weight. If you make sure to eat fewer calories than your body needs, you should lose weight. Ask your health care provider what a healthy weight is for you. For calorie counting to work, you will need to eat the right number of calories in a day in order to lose a healthy amount of weight per week. A dietitian can help you determine how many calories you need in a day and will give you suggestions on how to reach your calorie goal.  A healthy amount of weight to lose per week is usually 1-2 lb (0.5-0.9 kg). This usually means that your daily calorie intake should be reduced by 500-750 calories.  Eating 1,200 - 1,500 calories per day can help most women lose weight.  Eating 1,500 - 1,800 calories per day can help most men lose weight.  What is my plan? My goal is to have __________ calories per day. If I have this many calories per day, I should lose around __________ pounds per week. What do I need to know about calorie counting? In order to meet your daily calorie goal, you will need to:  Find out how many calories are in each food you would like to eat. Try to do this before you eat.  Decide how much of the food you plan to eat.  Write down what you ate and how many calories it had. Doing this is called keeping a food log.  To successfully lose weight, it is important to balance calorie counting with a healthy lifestyle that includes regular activity. Aim for 150  minutes of moderate exercise (such as walking) or 75 minutes of vigorous exercise (such as running) each week. Where do I find calorie information?  The number of calories in a food can be found on a Nutrition Facts label. If a food does not have a Nutrition Facts label, try to look up the calories online or ask your dietitian for help. Remember that calories are listed per serving. If you choose to have more than one serving of a food, you will have to multiply the calories per serving by the amount of servings you plan to eat. For example, the label on a package of bread might say that a serving size is 1 slice and that there are 90 calories in a serving. If you eat 1 slice, you will have eaten 90 calories. If you eat 2 slices, you will have eaten 180 calories. How do I keep a food log? Immediately after each meal, record the following information in your food log:  What you ate. Don't forget  to include toppings, sauces, and other extras on the food.  How much you ate. This can be measured in cups, ounces, or number of items.  How many calories each food and drink had.  The total number of calories in the meal.  Keep your food log near you, such as in a small notebook in your pocket, or use a mobile app or website. Some programs will calculate calories for you and show you how many calories you have left for the day to meet your goal. What are some calorie counting tips?  Use your calories on foods and drinks that will fill you up and not leave you hungry: ? Some examples of foods that fill you up are nuts and nut butters, vegetables, lean proteins, and high-fiber foods like whole grains. High-fiber foods are foods with more than 5 g fiber per serving. ? Drinks such as sodas, specialty coffee drinks, alcohol, and juices have a lot of calories, yet do not fill you up.  Eat nutritious foods and avoid empty calories. Empty calories are calories you get from foods or beverages that do not have  many vitamins or protein, such as candy, sweets, and soda. It is better to have a nutritious high-calorie food (such as an avocado) than a food with few nutrients (such as a bag of chips).  Know how many calories are in the foods you eat most often. This will help you calculate calorie counts faster.  Pay attention to calories in drinks. Low-calorie drinks include water and unsweetened drinks.  Pay attention to nutrition labels for "low fat" or "fat free" foods. These foods sometimes have the same amount of calories or more calories than the full fat versions. They also often have added sugar, starch, or salt, to make up for flavor that was removed with the fat.  Find a way of tracking calories that works for you. Get creative. Try different apps or programs if writing down calories does not work for you. What are some portion control tips?  Know how many calories are in a serving. This will help you know how many servings of a certain food you can have.  Use a measuring cup to measure serving sizes. You could also try weighing out portions on a kitchen scale. With time, you will be able to estimate serving sizes for some foods.  Take some time to put servings of different foods on your favorite plates, bowls, and cups so you know what a serving looks like.  Try not to eat straight from a bag or box. Doing this can lead to overeating. Put the amount you would like to eat in a cup or on a plate to make sure you are eating the right portion.  Use smaller plates, glasses, and bowls to prevent overeating.  Try not to multitask (for example, watch TV or use your computer) while eating. If it is time to eat, sit down at a table and enjoy your food. This will help you to know when you are full. It will also help you to be aware of what you are eating and how much you are eating. What are tips for following this plan? Reading food labels  Check the calorie count compared to the serving size. The  serving size may be smaller than what you are used to eating.  Check the source of the calories. Make sure the food you are eating is high in vitamins and protein and low in saturated and trans fats.  Shopping  Read nutrition labels while you shop. This will help you make healthy decisions before you decide to purchase your food.  Make a grocery list and stick to it. Cooking  Try to cook your favorite foods in a healthier way. For example, try baking instead of frying.  Use low-fat dairy products. Meal planning  Use more fruits and vegetables. Half of your plate should be fruits and vegetables.  Include lean proteins like poultry and fish. How do I count calories when eating out?  Ask for smaller portion sizes.  Consider sharing an entree and sides instead of getting your own entree.  If you get your own entree, eat only half. Ask for a box at the beginning of your meal and put the rest of your entree in it so you are not tempted to eat it.  If calories are listed on the menu, choose the lower calorie options.  Choose dishes that include vegetables, fruits, whole grains, low-fat dairy products, and lean protein.  Choose items that are boiled, broiled, grilled, or steamed. Stay away from items that are buttered, battered, fried, or served with cream sauce. Items labeled "crispy" are usually fried, unless stated otherwise.  Choose water, low-fat milk, unsweetened iced tea, or other drinks without added sugar. If you want an alcoholic beverage, choose a lower calorie option such as a glass of wine or light beer.  Ask for dressings, sauces, and syrups on the side. These are usually high in calories, so you should limit the amount you eat.  If you want a salad, choose a garden salad and ask for grilled meats. Avoid extra toppings like bacon, cheese, or fried items. Ask for the dressing on the side, or ask for olive oil and vinegar or lemon to use as dressing.  Estimate how many  servings of a food you are given. For example, a serving of cooked rice is  cup or about the size of half a baseball. Knowing serving sizes will help you be aware of how much food you are eating at restaurants. The list below tells you how big or small some common portion sizes are based on everyday objects: ? 1 oz-4 stacked dice. ? 3 oz-1 deck of cards. ? 1 tsp-1 die. ? 1 Tbsp- a ping-pong ball. ? 2 Tbsp-1 ping-pong ball. ?  cup- baseball. ? 1 cup-1 baseball. Summary  Calorie counting means keeping track of how many calories you eat and drink each day. If you eat fewer calories than your body needs, you should lose weight.  A healthy amount of weight to lose per week is usually 1-2 lb (0.5-0.9 kg). This usually means reducing your daily calorie intake by 500-750 calories.  The number of calories in a food can be found on a Nutrition Facts label. If a food does not have a Nutrition Facts label, try to look up the calories online or ask your dietitian for help.  Use your calories on foods and drinks that will fill you up, and not on foods and drinks that will leave you hungry.  Use smaller plates, glasses, and bowls to prevent overeating. This information is not intended to replace advice given to you by your health care provider. Make sure you discuss any questions you have with your health care provider. Document Released: 11/19/2005 Document Revised: 10/19/2016 Document Reviewed: 10/19/2016 Elsevier Interactive Patient Education  2018 [place] Sizes A serving size is a measured amount of food or drink, such as one slice of  bread, that has an associated nutrient content. Knowing the serving size of a food or drink can help you determine how much of that food you should consume. What is the size of one serving? The size of one healthy serving depends on the food or drink. To determine a serving size, read the food label. If the food or drink does not have a food label, try  to find serving size information online. Or, use the following to estimate the size of one adult serving: Grain 1 slice bread.  bagel.  cup pasta. Vegetable  cup cooked or canned vegetables. 1 cup raw, leafy greens. Fruit  cup canned fruit. 1 medium fruit.  cup dried fruit. Meat and Other Protein Sources 1 oz meat, poultry, or fish.  cup cooked beans. 1 egg.  cup nuts or seeds. 1 Tbsp nut butter.  cup tofu or tempeh. 2 Tbsp hummus. Dairy An individual container of yogurt (6-8 oz). 1 piece of cheese the size of your thumb (1 oz). 1 cup (8 oz) milk or milk alternative. Fat A piece the size of one dice. 1 tsp soft margarine. 1 Tbsp mayonnaise. 1 tsp vegetable oil. 1 Tbsp regular salad dressing. 2 Tbsp low-fat salad dressing. How many servings should I eat from each food group each day? The following are the suggested number of servings to try and have every day from each food group. You can also look at your eating throughout the week and aim for meeting these requirements on most days for overall healthy eating. Grain 6-8 servings. Try to have half of your grains from whole grains, such as whole wheat bread, corn tortillas, oatmeal, brown rice, whole wheat pasta, and bulgur. Vegetable At least 2-3 servings. Fruit 2 servings. Meat and Other Protein Foods 5-6 servings. Aim to have lean proteins, such as chicken, [place], fish, beans, or tofu. Dairy 3 servings. Choose low-fat or nonfat if you are trying to control your weight. Fat 2-3 servings. Is a serving the same thing as a portion? No. A portion is the actual amount you eat, which may be more than one serving. Knowing the specific serving size of a food and the nutritional information that goes with it can help you make a healthy decision on what size portion to eat. What are some tips to help me learn healthy serving sizes?  Check food labels for serving sizes. Many foods that come as a single portion actually contain multiple  servings.  Determine the serving size of foods you commonly eat and figure out how large a portion you usually eat.  Measure the number of servings that can be held by the bowls, glasses, cups, and plates you typically use. For example, pour your breakfast cereal into your regular bowl and then pour it into a measuring cup.  For 1-2 days, measure the serving sizes of all the foods you eat.  Practice estimating serving sizes and determining how big your portions should be. This information is not intended to replace advice given to you by your health care provider. Make sure you discuss any questions you have with your health care provider. Document Released: 08/18/2003 Document Revised: 07/14/2016 Document Reviewed: 02/16/2014 Elsevier Interactive Patient Education  2018 [place] [employer], Adult Staying at a healthy weight is important. When fat builds up in your body, you may become overweight or obese. These conditions put you at greater risk for developing certain health problems, such as heart disease, diabetes, sleeping problems, joint problems,  and some cancers. Unhealthy weight gain is often the result of making unhealthy choices in what you eat. It is also a result of not getting enough exercise. You can make changes to your lifestyle to prevent obesity and stay as healthy as possible. What nutrition changes can be made? To maintain a healthy weight and prevent obesity:  Eat only as much as your body needs. To do this: ? Pay attention to signs that you are hungry or full. Stop eating as soon as you feel full. ? If you feel hungry, try drinking water first. Drink enough water so your urine is clear or pale yellow. ? Eat smaller portions. ? Look at serving sizes on food labels. Most foods contain more than one serving per container. ? Eat the recommended amount of calories for your gender and activity level. While most active people should eat around 2,000  calories per day, if you are trying to lose weight or are not very active, you main need to eat less calories. Talk to your health care provider or dietitian about how many calories you should eat each day.  Choose healthy foods, such as: ? Fruits and vegetables. Try to fill at least half of your plate at each meal with fruits and vegetables. ? Whole grains, such as whole wheat bread, brown rice, and quinoa. ? Lean meats, such as chicken or fish. ? Other healthy proteins, such as beans, eggs, or tofu. ? Healthy fats, such as nuts, seeds, fatty fish, and olive oil. ? Low-fat or fat-free dairy.  Check food labels and avoid food and drinks that: ? Are high in calories. ? Have added sugar. ? Are high in sodium. ? Have saturated fats or trans fats.  Limit how much you eat of the following foods: ? Prepackaged meals. ? Fast food. ? Fried foods. ? Processed meat, such as bacon, sausage, and deli meats. ? Fatty cuts of red meat and poultry with skin.  Cook foods in healthier ways, such as by baking, broiling, or grilling.  When grocery shopping, try to shop around the outside of the store. This helps you buy mostly fresh foods and avoid canned and prepackaged foods.  What lifestyle changes can be made?  Exercise at least 30 minutes 5 or more days each week. Exercising includes brisk walking, yard work, biking, running, swimming, and team sports like basketball and soccer. Ask your health care provider which exercises are safe for you.  Do not use any products that contain nicotine or tobacco, such as cigarettes and e-cigarettes. If you need help quitting, ask your health care provider.  Limit alcohol intake to no more than 1 drink a day for nonpregnant women and 2 drinks a day for men. One drink equals 12 oz of beer, 5 oz of wine, or 1 oz of hard liquor.  Try to get 7-9 hours of sleep each night. What other changes can be made?  Keep a food and activity journal to keep track  of: ? What you ate and how many calories you had. Remember to count sauces, dressings, and side dishes. ? Whether you were active, and what exercises you did. ? Your calorie, weight, and activity goals.  Check your weight regularly. Track any changes. If you notice you have gained weight, make changes to your diet or activity routine.  Avoid taking weight-loss medicines or supplements. Talk to your health care provider before starting any new medicine or supplement.  Talk to your health care provider before trying any  new diet or exercise plan. Why are these changes important? Eating healthy, staying active, and having healthy habits not only help prevent obesity, they also:  Help you to manage stress and emotions.  Help you to connect with friends and family.  Improve your self-esteem.  Improve your sleep.  Prevent long-term health problems.  What can happen if changes are not made? Being obese or overweight can cause you to develop joint or bone problems, which can make it hard for you to stay active or do activities you enjoy. Being obese or overweight also puts stress on your heart and lungs and can lead to health problems like diabetes, heart disease, and some cancers. Where to find more information: Talk with your health care provider or a dietitian about healthy eating and healthy lifestyle choices. You may also find other information through these resources:  U.S. Department of Agriculture MyPlate: FormerBoss.no  American Heart Association: www.heart.org  Centers for Disease Control and Prevention: http://www.wolf.info/  Summary  Staying at a healthy weight is important. It helps prevent certain diseases and health problems, such as heart disease, diabetes, joint problems, sleep disorders, and some cancers.  Being obese or overweight can cause you to develop joint or bone problems, which can make it hard for you to stay active or do activities you enjoy.  You can prevent  unhealthy weight gain by eating a healthy diet, exercising regularly, not smoking, limiting alcohol, and getting enough sleep.  Talk with your health care provider or a dietitian for guidance about healthy eating and healthy lifestyle choices. This information is not intended to replace advice given to you by your health care provider. Make sure you discuss any questions you have with your health care provider. Document Released: 11/20/2016 Document Revised: 12/26/2016 Document Reviewed: 12/26/2016 Elsevier Interactive Patient Education  [employer].

## 2018-08-27 LAB — MICROALBUMIN / CREATININE URINE RATIO
CREATININE, UR: 158.2 mg/dL
MICROALBUM., U, RANDOM: 29.2 ug/mL
Microalb/Creat Ratio: 18.5 mg/g creat (ref 0.0–30.0)

## 2018-08-28 ENCOUNTER — Telehealth: Payer: Self-pay

## 2018-08-28 ENCOUNTER — Telehealth: Payer: Self-pay | Admitting: Physical Therapy

## 2018-08-28 ENCOUNTER — Encounter: Payer: Self-pay | Admitting: Nurse Practitioner

## 2018-08-28 ENCOUNTER — Ambulatory Visit: Payer: Medicaid Other | Admitting: Physical Therapy

## 2018-08-28 NOTE — Telephone Encounter (Signed)
Patient no-showed for appt.  Left message for her to call.

## 2018-08-28 NOTE — Telephone Encounter (Signed)
CMA attempt to reach patient to inform on results.  No answer and left a VM for patient to call back.  If patient call back, please inform:  Microalbumin does not show any diabetic kidney damage

## 2018-08-28 NOTE — Telephone Encounter (Signed)
-----   Message from Gildardo Pounds, NP sent at 08/28/2018 12:02 AM EDT ----- Microalbumin does not show any diabetic kidney damage

## 2018-08-31 ENCOUNTER — Encounter (HOSPITAL_COMMUNITY): Payer: Self-pay | Admitting: Emergency Medicine

## 2018-08-31 ENCOUNTER — Ambulatory Visit (HOSPITAL_COMMUNITY)
Admission: EM | Admit: 2018-08-31 | Discharge: 2018-08-31 | Disposition: A | Discharge status: Home / Self Care | Payer: Medicaid Other | Attending: Internal Medicine | Admitting: Internal Medicine

## 2018-08-31 DIAGNOSIS — R030 Elevated blood-pressure reading, without diagnosis of hypertension: Secondary | ICD-10-CM

## 2018-08-31 DIAGNOSIS — J069 Acute upper respiratory infection, unspecified: Secondary | ICD-10-CM

## 2018-08-31 MED ORDER — FLUTICASONE PROPIONATE 50 MCG/ACT NA SUSP: 1.0000 | Freq: Every day | NASAL | 0 refills | Status: DC

## 2018-08-31 MED ORDER — ALBUTEROL SULFATE HFA 108 (90 BASE) MCG/ACT IN AERS
2.0000 | INHALATION_SPRAY | Freq: Once | RESPIRATORY_TRACT | Status: AC
  Administered 2018-08-31: 2 via RESPIRATORY_TRACT

## 2018-08-31 MED ORDER — BENZONATATE 100 MG PO CAPS: 100.0000 mg | ORAL_CAPSULE | Freq: Three times a day (TID) | ORAL | 0 refills | Status: DC

## 2018-08-31 MED ORDER — ALBUTEROL SULFATE HFA 108 (90 BASE) MCG/ACT IN AERS
INHALATION_SPRAY | RESPIRATORY_TRACT | Status: AC
  Filled 2018-08-31: qty 6.7

## 2018-08-31 MED ORDER — CETIRIZINE HCL 10 MG PO CAPS: 10.0000 mg | ORAL_CAPSULE | Freq: Every day | ORAL | 0 refills | Status: DC

## 2018-08-31 NOTE — ED Triage Notes (Signed)
Pt c/o cough, congestion, runny nose, chills, body aches since Friday.

## 2018-08-31 NOTE — Discharge Instructions (Addendum)
Inhaler given.  Use as needed for shortness of breath and/or wheezing Get plenty of rest and push fluids Zyrtec daily for symptomatic relief Flonase prescribed.  Used as directed.   Prescribed tessolone perles as needed for cough Follow up with PCP or with [facility]  if symptoms persist Return or go to ER if you have any new or worsening symptoms   Blood pressure elevated in office.  Please recheck in 24 hours.  If it continues to be greater than 140/90 please follow up with PCP or with [facility] for further evaluation and management.

## 2018-08-31 NOTE — ED Provider Notes (Signed)
[facility]   [id number] 08/31/18 Arrival Time: 5188  CC: URI and cough  SUBJECTIVE:  Sabrina Larson is a 42 y.o. female who presents with nasal congestion and persistent dry cough x 2 days.  Admits to positive sick exposure at work.  Describes cough as intermittent and dry.  Has tried OTC mucinex with minimal relief.  Denies previous symptoms in the past. Complains of subjective fever, chills, fatigue, rhinorrhea, and SOB.  Denies fatigue, sinus pain, sore throat,  wheezing, chest pain, nausea, changes in bowel or bladder habits.    ROS: As per HPI.  Past Medical History:  Diagnosis Date  . GERD (gastroesophageal reflux disease)   . Hypertension   . Prediabetes    Past Surgical History:  Procedure Laterality Date  . CESAREAN SECTION     2014, 1995  . DILATION AND CURETTAGE OF UTERUS     8 total in office and outpatient  . SKIN GRAFT     at age 76   No Known Allergies No current facility-administered medications on file prior to encounter.    Current Outpatient Medications on File Prior to Encounter  Medication Sig Dispense Refill  . etodolac (LODINE) 400 MG tablet Take 1 tablet (400 mg total) by mouth 2 (two) times daily as needed. 60 tablet 3  . famotidine (PEPCID) 20 MG tablet Take 1 tablet (20 mg total) by mouth 2 (two) times daily before a meal. 60 tablet 5  . metFORMIN (GLUCOPHAGE) 500 MG tablet TAKE 1 TABLET(500 MG) BY MOUTH TWICE DAILY WITH A MEAL 60 tablet 5  . methocarbamol (ROBAXIN) 500 MG tablet Take 1 tablet (500 mg total) by mouth 3 (three) times daily. X 10 days then prn muscle spasm 90 tablet 1  . Multiple Vitamins-Minerals (MULTIVITAMIN WITH MINERALS) tablet Take 1 tablet by mouth daily.    . naproxen (NAPROSYN) 500 MG tablet Take 1 tablet (500 mg total) by mouth 2 (two) times daily with a meal. X 10 days then prn pain 60 tablet 1    Social History   Socioeconomic History  . Marital status: Single    Spouse name: Not on file  . Number of  children: Not on file  . Years of education: Not on file  . Highest education level: Not on file  Occupational History  . Not on file  Social Needs  . Financial resource strain: Not on file  . Food insecurity:    Worry: Not on file    Inability: Not on file  . Transportation needs:    Medical: Not on file    Non-medical: Not on file  Tobacco Use  . Smoking status: Current Every Day Smoker    Packs/day: 0.50  . Smokeless tobacco: Never Used  Substance and Sexual Activity  . Alcohol use: Yes    Alcohol/week: 1.0 - 2.0 standard drinks    Types: 1 - 2 Glasses of wine per week    Comment: rarely   . Drug use: No  . Sexual activity: Yes  Lifestyle  . Physical activity:    Days per week: Not on file    Minutes per session: Not on file  . Stress: Not on file  Relationships  . Social connections:    Talks on phone: Not on file    Gets together: Not on file    Attends religious service: Not on file    Active member of club or organization: Not on file    Attends meetings of clubs or organizations:  Not on file    Relationship status: Not on file  . Intimate partner violence:    Fear of current or ex partner: Not on file    Emotionally abused: Not on file    Physically abused: Not on file    Forced sexual activity: Not on file  Other Topics Concern  . Not on file  Social History Narrative  . Not on file   Family History  Problem Relation Age of Onset  . Asthma Mother   . Hypertension Mother   . Asthma Brother      OBJECTIVE:  Vitals:   08/31/18 1823 08/31/18 1926  BP: (!) 150/101 (!) 161/98  Pulse: 75   Resp: 16   Temp: 97.9 F (36.6 C)   SpO2: 100%      General appearance: AOx3 in no acute distress; appears fatigued HEENT: Ears: EACs clear, TMs pearly gray; Eyes: PERRL.  EOM grossly intact. Nose: mild clear rhinorrhea; tonsils nonerythematous, uvula midline Neck: supple without LAD Lungs: clear to auscultation bilaterally without adventitious breath sounds;  mild dry cough Heart: regular rate and rhythm.  Radial pulses 2+ symmetrical bilaterally Skin: warm and dry Psychological: alert and cooperative; normal mood and affect  ASSESSMENT & PLAN:  1. URI with cough and congestion   2. Elevated blood pressure reading     Meds ordered this encounter  Medications  . albuterol (PROVENTIL HFA;VENTOLIN HFA) 108 (90 Base) MCG/ACT inhaler 2 puff  . Cetirizine HCl 10 MG CAPS    Sig: Take 1 capsule (10 mg total) by mouth daily for 20 days.    Dispense:  20 capsule    Refill:  0    Order Specific Question:   Supervising Provider    Answer:   Wynona Luna [phone]  . fluticasone (FLONASE) 50 MCG/ACT nasal spray    Sig: Place 1-2 sprays into both nostrils daily.    Dispense:  16 g    Refill:  0    Order Specific Question:   Supervising Provider    Answer:   Wynona Luna [phone]  . benzonatate (TESSALON) 100 MG capsule    Sig: Take 1 capsule (100 mg total) by mouth every 8 (eight) hours.    Dispense:  21 capsule    Refill:  0    Order Specific Question:   Supervising Provider    Answer:   Wynona Luna [[id number]]   Inhaler given.  Use as needed for shortness of breath and/or wheezing Get plenty of rest and push fluids Zyrtec daily for symptomatic relief Flonase prescribed.  Used as directed.   Prescribed tessolone perles as needed for cough Follow up with PCP if symptoms persist Return or go to ER if you have any new or worsening symptoms   Blood pressure elevated in office.  Please recheck in 24 hours.  If it continues to be greater than 140/90 please follow up with PCP for further evaluation and management.    Reviewed expectations re: course of current medical issues. Questions answered. Outlined signs and symptoms indicating need for more acute intervention. Patient verbalized understanding. After Visit Summary given.          Lestine Box, PA-C 08/31/18 1932

## 2018-09-19 ENCOUNTER — Telehealth: Payer: Self-pay

## 2018-09-19 NOTE — Telephone Encounter (Signed)
Please send RX for Pataday or Pazeo to [facility] on [address] to replace the Patanol eye drops written by angela on 07/04/18, the pt's insurance no longer covers them.

## 2018-09-21 ENCOUNTER — Other Ambulatory Visit: Payer: Self-pay | Admitting: Nurse Practitioner

## 2018-09-21 MED ORDER — OLOPATADINE HCL 0.2 % OP SOLN: OPHTHALMIC | 1 refills | Status: DC

## 2018-09-25 ENCOUNTER — Ambulatory Visit
Admission: RE | Admit: 2018-09-25 | Discharge: 2018-09-25 | Disposition: A | Discharge status: Home / Self Care | Payer: Medicaid Other | Source: Ambulatory Visit | Attending: Nurse Practitioner | Admitting: Nurse Practitioner

## 2018-09-25 DIAGNOSIS — Z1231 Encounter for screening mammogram for malignant neoplasm of breast: Secondary | ICD-10-CM | POA: Diagnosis not present

## 2018-10-07 ENCOUNTER — Encounter (HOSPITAL_COMMUNITY): Payer: Self-pay | Admitting: Emergency Medicine

## 2018-10-07 ENCOUNTER — Emergency Department (HOSPITAL_COMMUNITY)
Admission: EM | Admit: 2018-10-07 | Discharge: 2018-10-08 | Disposition: A | Discharge status: Home / Self Care | Payer: Medicaid Other | Attending: Emergency Medicine | Admitting: Emergency Medicine

## 2018-10-07 ENCOUNTER — Other Ambulatory Visit: Payer: Self-pay

## 2018-10-07 ENCOUNTER — Emergency Department (HOSPITAL_COMMUNITY): Payer: Medicaid Other

## 2018-10-07 DIAGNOSIS — Z79899 Other long term (current) drug therapy: Secondary | ICD-10-CM | POA: Insufficient documentation

## 2018-10-07 DIAGNOSIS — I10 Essential (primary) hypertension: Secondary | ICD-10-CM | POA: Insufficient documentation

## 2018-10-07 DIAGNOSIS — F17200 Nicotine dependence, unspecified, uncomplicated: Secondary | ICD-10-CM | POA: Insufficient documentation

## 2018-10-07 DIAGNOSIS — J181 Lobar pneumonia, unspecified organism: Secondary | ICD-10-CM

## 2018-10-07 DIAGNOSIS — J189 Pneumonia, unspecified organism: Secondary | ICD-10-CM | POA: Insufficient documentation

## 2018-10-07 LAB — COMPREHENSIVE METABOLIC PANEL
ALT: 17 U/L (ref 0–44)
ANION GAP: 7 (ref 5–15)
AST: 19 U/L (ref 15–41)
Albumin: 3.1 g/dL — ABNORMAL LOW (ref 3.5–5.0)
Alkaline Phosphatase: 77 U/L (ref 38–126)
BILIRUBIN TOTAL: 0.2 mg/dL — AB (ref 0.3–1.2)
BUN: 13 mg/dL (ref 6–20)
CO2: 24 mmol/L (ref 22–32)
CREATININE: 0.76 mg/dL (ref 0.44–1.00)
Calcium: 9 mg/dL (ref 8.9–10.3)
Chloride: 108 mmol/L (ref 98–111)
Glucose, Bld: 101 mg/dL — ABNORMAL HIGH (ref 70–99)
POTASSIUM: 3.4 mmol/L — AB (ref 3.5–5.1)
Sodium: 139 mmol/L (ref 135–145)
Total Protein: 6.1 g/dL — ABNORMAL LOW (ref 6.5–8.1)

## 2018-10-07 LAB — CBC WITH DIFFERENTIAL/PLATELET
Abs Immature Granulocytes: 0.01 10*3/uL (ref 0.00–0.07)
BASOS PCT: 0 %
Basophils Absolute: 0 10*3/uL (ref 0.0–0.1)
EOS ABS: 0.4 10*3/uL (ref 0.0–0.5)
EOS PCT: 5 %
HCT: 38 % (ref 36.0–46.0)
Hemoglobin: 12.2 g/dL (ref 12.0–15.0)
Immature Granulocytes: 0 %
LYMPHS ABS: 2.6 10*3/uL (ref 0.7–4.0)
Lymphocytes Relative: 34 %
MCH: 26.8 pg (ref 26.0–34.0)
MCHC: 32.1 g/dL (ref 30.0–36.0)
MCV: 83.3 fL (ref 80.0–100.0)
Monocytes Absolute: 0.7 10*3/uL (ref 0.1–1.0)
Monocytes Relative: 9 %
NRBC: 0 % (ref 0.0–0.2)
Neutro Abs: 3.9 10*3/uL (ref 1.7–7.7)
Neutrophils Relative %: 52 %
PLATELETS: 311 10*3/uL (ref 150–400)
RBC: 4.56 MIL/uL (ref 3.87–5.11)
RDW: 13.9 % (ref 11.5–15.5)
WBC: 7.6 10*3/uL (ref 4.0–10.5)

## 2018-10-07 LAB — GROUP A STREP BY PCR: Group A Strep by PCR: NOT DETECTED

## 2018-10-07 NOTE — ED Triage Notes (Signed)
Pt to ED with c/o sore throat, productive cough and headache x's 4 days

## 2018-10-08 MED ORDER — DOXYCYCLINE HYCLATE 100 MG PO TABS
100.0000 mg | ORAL_TABLET | Freq: Once | ORAL | Status: AC
  Administered 2018-10-08: 100 mg via ORAL
  Filled 2018-10-08: qty 1

## 2018-10-08 MED ORDER — BENZONATATE 100 MG PO CAPS: 200.0000 mg | ORAL_CAPSULE | Freq: Two times a day (BID) | ORAL | 0 refills | Status: DC | PRN

## 2018-10-08 MED ORDER — DOXYCYCLINE HYCLATE 100 MG PO CAPS: 100.0000 mg | ORAL_CAPSULE | Freq: Two times a day (BID) | ORAL | 0 refills | Status: DC

## 2018-10-08 NOTE — ED Provider Notes (Signed)
[facility] EMERGENCY DEPARTMENT Provider Note   CSN: [id number] Arrival date & time: 10/07/18  2148     History   Chief Complaint Chief Complaint  Patient presents with  . Sore Throat  . Cough  . Headache    HPI Sabrina Larson is a 42 y.o. female.  Patient presents to the emergency department with a chief complaint of sore throat and productive cough x4 days.  She reports associated headache.  States that she took her temperature at home and it was 102.1.  She reports having had associated chills.  She reports worsened back pain and headache with coughing.  Denies any effective treatments PTA.  The history is provided by the patient. No language interpreter was used.    Past Medical History:  Diagnosis Date  . GERD (gastroesophageal reflux disease)   . Hypertension   . Prediabetes     Patient Active Problem List   Diagnosis Date Noted  . History of gastric ulcer 12/24/2017  . GERD (gastroesophageal reflux disease) 01/22/2017  . Acute conjunctivitis of both eyes 01/22/2017  . Hypertension 07/05/2016  . Prediabetes 05/02/2016    Past Surgical History:  Procedure Laterality Date  . CESAREAN SECTION     2014, 1995  . DILATION AND CURETTAGE OF UTERUS     8 total in office and outpatient  . SKIN GRAFT     at age 72     OB History    Gravida  49   Para  6   Term  6   Preterm  0   AB  8   Living  6     SAB  0   TAB  0   Ectopic  0   Multiple  0   Live Births           Obstetric Comments  TSVD x 4. Term c-section x 2. ?8 D&Cs         Home Medications    Prior to Admission medications   Medication Sig Start Date End Date Taking? Authorizing Provider  benzonatate (TESSALON) 100 MG capsule Take 2 capsules (200 mg total) by mouth 2 (two) times daily as needed for cough. 10/08/18   Montine Circle, PA-C  Cetirizine HCl 10 MG CAPS Take 1 capsule (10 mg total) by mouth daily for 20 days. 08/31/18 09/20/18  Wurst,  Marye Round, PA-C  doxycycline (VIBRAMYCIN) 100 MG capsule Take 1 capsule (100 mg total) by mouth 2 (two) times daily. 10/08/18   Montine Circle, PA-C  etodolac (LODINE) 400 MG tablet Take 1 tablet (400 mg total) by mouth 2 (two) times daily as needed. 07/23/18   Hilts, Legrand Como, MD  famotidine (PEPCID) 20 MG tablet Take 1 tablet (20 mg total) by mouth 2 (two) times daily before a meal. 12/24/17   Hairston, Maylon Peppers, FNP  fluticasone (FLONASE) 50 MCG/ACT nasal spray Place 1-2 sprays into both nostrils daily. 08/31/18   Wurst, [place], PA-C  metFORMIN (GLUCOPHAGE) 500 MG tablet TAKE 1 TABLET(500 MG) BY MOUTH TWICE DAILY WITH A MEAL 06/19/18   McClung, Levada Dy M, PA-C  methocarbamol (ROBAXIN) 500 MG tablet Take 1 tablet (500 mg total) by mouth 3 (three) times daily. X 10 days then prn muscle spasm 07/16/18   Argentina Donovan, PA-C  Multiple Vitamins-Minerals (MULTIVITAMIN WITH MINERALS) tablet Take 1 tablet by mouth daily.    [provider]  naproxen (NAPROSYN) 500 MG tablet Take 1 tablet (500 mg total) by mouth 2 (two) times daily  with a meal. X 10 days then prn pain 07/16/18   Freeman Caldron M, PA-C  Olopatadine HCl 0.2 % SOLN Apply one drop into each affected eye daily 09/21/18   Gildardo Pounds, NP    Family History Family History  Problem Relation Age of Onset  . Asthma Mother   . Hypertension Mother   . Asthma Brother     Social History Social History   Tobacco Use  . Smoking status: Current Every Day Smoker    Packs/day: 0.50  . Smokeless tobacco: Never Used  Substance Use Topics  . Alcohol use: Yes    Alcohol/week: 1.0 - 2.0 standard drinks    Types: 1 - 2 Glasses of wine per week    Comment: rarely   . Drug use: No     Allergies   Patient has no known allergies.   Review of Systems Review of Systems  All other systems reviewed and are negative.    Physical Exam Updated Vital Signs BP (!) 143/98 (BP Location: Right Arm)   Pulse 79   Temp 98.9 F (37.2 C)  (Oral)   Resp 16   Ht 5' 6.5" (1.689 m)   Wt 88.5 kg   LMP 09/30/2018 (Exact Date)   SpO2 98%   BMI 31.00 kg/m   Physical Exam  Constitutional: She is oriented to person, place, and time. She appears well-developed and well-nourished.  HENT:  Head: Normocephalic and atraumatic.  Eyes: Pupils are equal, round, and reactive to light. Conjunctivae and EOM are normal.  Neck: Normal range of motion. Neck supple.  Cardiovascular: Normal rate and regular rhythm. Exam reveals no gallop and no friction rub.  No murmur heard. Pulmonary/Chest: Effort normal and breath sounds normal. No respiratory distress. She has no wheezes. She has no rales. She exhibits no tenderness.  Abdominal: Soft. Bowel sounds are normal. She exhibits no distension and no mass. There is no tenderness. There is no rebound and no guarding.  Musculoskeletal: Normal range of motion. She exhibits no edema or tenderness.  Neurological: She is alert and oriented to person, place, and time.  Skin: Skin is warm and dry.  Psychiatric: She has a normal mood and affect. Her behavior is normal. Judgment and thought content normal.  Nursing note and vitals reviewed.    ED Treatments / Results  Labs (all labs ordered are listed, but only abnormal results are displayed) Labs Reviewed  COMPREHENSIVE METABOLIC PANEL - Abnormal; Notable for the following components:      Result Value   Potassium 3.4 (*)    Glucose, Bld 101 (*)    Total Protein 6.1 (*)    Albumin 3.1 (*)    Total Bilirubin 0.2 (*)    All other components within normal limits  GROUP A STREP BY PCR  CBC WITH DIFFERENTIAL/PLATELET    EKG None  Radiology Dg Chest 2 View  Result Date: 10/07/2018 CLINICAL DATA:  Productive cough, headache, chest pain, and back pain for 3 days. EXAM: CHEST - 2 VIEW COMPARISON:  The cardiomediastinal gradually at none. FINDINGS: The cardiomediastinal silhouette is within normal limits. There is mild peribronchial thickening.  Streaky opacity is present in the right lower lobe. No pleural effusion or pneumothorax is identified. No acute osseous abnormality is seen. IMPRESSION: Streaky right lower lobe opacity, suspicious for pneumonia. Electronically Signed   By: Logan Bores M.D.   On: 10/07/2018 22:29    Procedures Procedures (including critical care time)  Medications Ordered in ED Medications  doxycycline (VIBRA-TABS) tablet 100 mg (100 mg Oral Given 10/08/18 0150)     Initial Impression / Assessment and Plan / ED Course  I have reviewed the triage vital signs and the nursing notes.  Pertinent labs & imaging results that were available during my care of the patient were reviewed by me and considered in my medical decision making (see chart for details).     Patient was fevers and productive cough.  Chest x-ray is consistent with pneumonia.  Will treat with doxycycline.  Vital signs are stable.  I believe the patient will do fine with outpatient treatment.  Return precautions given. Final Clinical Impressions(s) / ED Diagnoses   Final diagnoses:  Community acquired pneumonia of right lower lobe of lung [facility])    ED Discharge Orders         Ordered    doxycycline (VIBRAMYCIN) 100 MG capsule  2 times daily     10/08/18 0141    benzonatate (TESSALON) 100 MG capsule  2 times daily PRN     10/08/18 0141           Montine Circle, PA-C 10/08/18 0244    Ripley Fraise, MD 10/08/18 [phone]

## 2018-11-13 ENCOUNTER — Ambulatory Visit: Payer: Medicaid Other | Admitting: Family Medicine

## 2018-11-21 ENCOUNTER — Encounter (HOSPITAL_COMMUNITY): Payer: Self-pay

## 2018-11-21 ENCOUNTER — Ambulatory Visit (HOSPITAL_COMMUNITY)
Admission: EM | Admit: 2018-11-21 | Discharge: 2018-11-21 | Disposition: A | Discharge status: Home / Self Care | Payer: Medicaid Other | Attending: Internal Medicine | Admitting: Internal Medicine

## 2018-11-21 DIAGNOSIS — Z8719 Personal history of other diseases of the digestive system: Secondary | ICD-10-CM | POA: Insufficient documentation

## 2018-11-21 DIAGNOSIS — Z8249 Family history of ischemic heart disease and other diseases of the circulatory system: Secondary | ICD-10-CM | POA: Insufficient documentation

## 2018-11-21 DIAGNOSIS — R7303 Prediabetes: Secondary | ICD-10-CM | POA: Insufficient documentation

## 2018-11-21 DIAGNOSIS — F17210 Nicotine dependence, cigarettes, uncomplicated: Secondary | ICD-10-CM | POA: Insufficient documentation

## 2018-11-21 DIAGNOSIS — Z7984 Long term (current) use of oral hypoglycemic drugs: Secondary | ICD-10-CM | POA: Insufficient documentation

## 2018-11-21 DIAGNOSIS — K219 Gastro-esophageal reflux disease without esophagitis: Secondary | ICD-10-CM | POA: Insufficient documentation

## 2018-11-21 DIAGNOSIS — Z7951 Long term (current) use of inhaled steroids: Secondary | ICD-10-CM | POA: Insufficient documentation

## 2018-11-21 DIAGNOSIS — N3090 Cystitis, unspecified without hematuria: Secondary | ICD-10-CM | POA: Insufficient documentation

## 2018-11-21 DIAGNOSIS — I10 Essential (primary) hypertension: Secondary | ICD-10-CM

## 2018-11-21 DIAGNOSIS — Z79899 Other long term (current) drug therapy: Secondary | ICD-10-CM | POA: Insufficient documentation

## 2018-11-21 LAB — POCT URINALYSIS DIP (DEVICE)
Bilirubin Urine: NEGATIVE
Glucose, UA: NEGATIVE mg/dL
HGB URINE DIPSTICK: NEGATIVE
KETONES UR: NEGATIVE mg/dL
Nitrite: NEGATIVE
PH: 6.5 (ref 5.0–8.0)
PROTEIN: NEGATIVE mg/dL
Specific Gravity, Urine: 1.025 (ref 1.005–1.030)
UROBILINOGEN UA: 2 mg/dL — AB (ref 0.0–1.0)

## 2018-11-21 MED ORDER — FLUCONAZOLE 150 MG PO TABS: 150.0000 mg | ORAL_TABLET | Freq: Every day | ORAL | 0 refills | Status: DC

## 2018-11-21 MED ORDER — CEPHALEXIN 500 MG PO CAPS: 500.0000 mg | ORAL_CAPSULE | Freq: Two times a day (BID) | ORAL | 0 refills | Status: AC

## 2018-11-21 NOTE — ED Provider Notes (Signed)
[facility]    CSN: [id number] Arrival date & time: 11/21/18  1801     History   Chief Complaint Chief Complaint  Patient presents with  . Urinary Tract Infection    HPI Sabrina Larson is a 42 y.o. female.   42 year old female comes in for 1 week history of urinary symptoms.  Has had urinary frequency, urgency without dysuria or hematuria.  States has also noticed an odor that can also be present without urination.  Also notices cloudy urine.  Denies vaginal discharge, irritation, itching, spotting.  Denies abdominal pain, nausea, vomiting.  Denies fever, chills, night sweats.  LMP 11/03/2018.  Sexually active with one female partner, no condom use.     Past Medical History:  Diagnosis Date  . GERD (gastroesophageal reflux disease)   . Hypertension   . Prediabetes     Patient Active Problem List   Diagnosis Date Noted  . History of gastric ulcer 12/24/2017  . GERD (gastroesophageal reflux disease) 01/22/2017  . Acute conjunctivitis of both eyes 01/22/2017  . Hypertension 07/05/2016  . Prediabetes 05/02/2016    Past Surgical History:  Procedure Laterality Date  . CESAREAN SECTION     2014, 1995  . DILATION AND CURETTAGE OF UTERUS     8 total in office and outpatient  . SKIN GRAFT     at age 54    OB History    Gravida  28   Para  6   Term  6   Preterm  0   AB  8   Living  6     SAB  0   TAB  0   Ectopic  0   Multiple  0   Live Births           Obstetric Comments  TSVD x 4. Term c-section x 2. ?8 D&Cs         Home Medications    Prior to Admission medications   Medication Sig Start Date End Date Taking? Authorizing Provider  benzonatate (TESSALON) 100 MG capsule Take 2 capsules (200 mg total) by mouth 2 (two) times daily as needed for cough. 10/08/18   Montine Circle, PA-C  cephALEXin (KEFLEX) 500 MG capsule Take 1 capsule (500 mg total) by mouth 2 (two) times daily for 5 days. 11/21/18 11/26/18  Ok Edwards, PA-C   Cetirizine HCl 10 MG CAPS Take 1 capsule (10 mg total) by mouth daily for 20 days. 08/31/18 09/20/18  Wurst, Marye Round, PA-C  doxycycline (VIBRAMYCIN) 100 MG capsule Take 1 capsule (100 mg total) by mouth 2 (two) times daily. 10/08/18   Montine Circle, PA-C  etodolac (LODINE) 400 MG tablet Take 1 tablet (400 mg total) by mouth 2 (two) times daily as needed. 07/23/18   Hilts, Legrand Como, MD  famotidine (PEPCID) 20 MG tablet Take 1 tablet (20 mg total) by mouth 2 (two) times daily before a meal. 12/24/17   Hairston, Maylon Peppers, FNP  fluconazole (DIFLUCAN) 150 MG tablet Take 1 tablet (150 mg total) by mouth daily. Take second dose 72 hours later if symptoms still persists. 11/21/18   Tasia Catchings, Amy V, PA-C  fluticasone (FLONASE) 50 MCG/ACT nasal spray Place 1-2 sprays into both nostrils daily. 08/31/18   Wurst, [place], PA-C  metFORMIN (GLUCOPHAGE) 500 MG tablet TAKE 1 TABLET(500 MG) BY MOUTH TWICE DAILY WITH A MEAL 06/19/18   McClung, Levada Dy M, PA-C  methocarbamol (ROBAXIN) 500 MG tablet Take 1 tablet (500 mg total) by mouth 3 (three) times  daily. X 10 days then prn muscle spasm 07/16/18   Argentina Donovan, PA-C  Multiple Vitamins-Minerals (MULTIVITAMIN WITH MINERALS) tablet Take 1 tablet by mouth daily.    [provider]  naproxen (NAPROSYN) 500 MG tablet Take 1 tablet (500 mg total) by mouth 2 (two) times daily with a meal. X 10 days then prn pain 07/16/18   Freeman Caldron M, PA-C  Olopatadine HCl 0.2 % SOLN Apply one drop into each affected eye daily 09/21/18   Gildardo Pounds, NP    Family History Family History  Problem Relation Age of Onset  . Asthma Mother   . Hypertension Mother   . Asthma Brother     Social History Social History   Tobacco Use  . Smoking status: Current Every Day Smoker    Packs/day: 0.50  . Smokeless tobacco: Never Used  Substance Use Topics  . Alcohol use: Yes    Alcohol/week: 1.0 - 2.0 standard drinks    Types: 1 - 2 Glasses of wine per week    Comment:  rarely   . Drug use: No     Allergies   Patient has no known allergies.   Review of Systems Review of Systems  Reason unable to perform ROS: See HPI as above.     Physical Exam Triage Vital Signs ED Triage Vitals  Enc Vitals Group     BP 11/21/18 1832 (!) 154/116     Pulse Rate 11/21/18 1832 82     Resp 11/21/18 1832 16     Temp 11/21/18 1832 98.1 F (36.7 C)     Temp Source 11/21/18 1832 Oral     SpO2 11/21/18 1832 98 %     Weight --      Height --      Head Circumference --      Peak Flow --      Pain Score 11/21/18 1835 0     Pain Loc --      Pain Edu? --      Excl. in [place]? --    No data found.  Updated Vital Signs BP (!) 154/116 (BP Location: Right Arm)   Pulse 82   Temp 98.1 F (36.7 C) (Oral)   Resp 16   LMP 11/03/2018   SpO2 98%   Physical Exam Constitutional:      General: She is not in acute distress.    Appearance: She is well-developed.  HENT:     Head: Normocephalic and atraumatic.  Eyes:     Conjunctiva/sclera: Conjunctivae normal.     Pupils: Pupils are equal, round, and reactive to light.  Cardiovascular:     Rate and Rhythm: Normal rate and regular rhythm.     Heart sounds: Normal heart sounds. No murmur. No friction rub. No gallop.   Pulmonary:     Effort: Pulmonary effort is normal.     Breath sounds: Normal breath sounds. No wheezing or rales.  Abdominal:     General: Bowel sounds are normal.     Palpations: Abdomen is soft. There is no mass.     Tenderness: There is no abdominal tenderness. There is no guarding or rebound.  Skin:    General: Skin is warm and dry.  Neurological:     Mental Status: She is alert and oriented to person, place, and time.  Psychiatric:        Behavior: Behavior normal.        Judgment: Judgment normal.  UC Treatments / Results  Labs (all labs ordered are listed, but only abnormal results are displayed) Labs Reviewed  POCT URINALYSIS DIP (DEVICE) - Abnormal; Notable for the following  components:      Result Value   Urobilinogen, UA 2.0 (*)    Leukocytes, UA SMALL (*)    All other components within normal limits  URINE CULTURE  CERVICOVAGINAL ANCILLARY ONLY    EKG None  Radiology No results found.  Procedures Procedures (including critical care time)  Medications Ordered in UC Medications - No data to display  Initial Impression / Assessment and Plan / UC Course  I have reviewed the triage vital signs and the nursing notes.  Pertinent labs & imaging results that were available during my care of the patient were reviewed by me and considered in my medical decision making (see chart for details).    Small leukocytes on dipstick.  Will cover for urinary tract infection with Keflex, urine culture sent.  Patient without history of BV, will send for cytology testing given odor.  Push fluids.  Return precautions given.  Patient expresses understanding and agrees to plan.  Final Clinical Impressions(s) / UC Diagnoses   Final diagnoses:  Cystitis    ED Prescriptions    Medication Sig Dispense Auth. Provider   cephALEXin (KEFLEX) 500 MG capsule Take 1 capsule (500 mg total) by mouth 2 (two) times daily for 5 days. 10 capsule Yu, Amy V, PA-C   fluconazole (DIFLUCAN) 150 MG tablet Take 1 tablet (150 mg total) by mouth daily. Take second dose 72 hours later if symptoms still persists. 2 tablet Tobin Chad, [place] 11/21/18 1926

## 2018-11-21 NOTE — ED Triage Notes (Signed)
Pt present UTI, symptoms started a week ago.

## 2018-11-21 NOTE — ED Notes (Signed)
Patient able to ambulate independently  

## 2018-11-21 NOTE — Discharge Instructions (Signed)
Your urine was positive for an urinary tract infection. Start keflex as directed. Keep hydrated, your urine should be clear to pale yellow in color.  I have also called in Diflucan, you can take with Keflex to prevent yeast infection, or you can take if experiencing yeast infection symptoms such as vaginal itching with cottage cheeselike discharge.  Cytology sent, you will be contacted with any positive results that requires further treatment. Refrain from sexual activity and alcohol use for the next 7 days. Monitor for any worsening of symptoms, fever, abdominal pain, nausea, vomiting, to follow up for reevaluation.

## 2018-11-24 ENCOUNTER — Telehealth (HOSPITAL_COMMUNITY): Payer: Self-pay | Admitting: Emergency Medicine

## 2018-11-24 LAB — CERVICOVAGINAL ANCILLARY ONLY
Bacterial vaginitis: POSITIVE — AB
Candida vaginitis: NEGATIVE
Chlamydia: NEGATIVE
Neisseria Gonorrhea: NEGATIVE
TRICH (WINDOWPATH): NEGATIVE

## 2018-11-24 LAB — URINE CULTURE

## 2018-11-24 MED ORDER — METRONIDAZOLE 500 MG PO TABS: 500.0000 mg | ORAL_TABLET | Freq: Two times a day (BID) | ORAL | 0 refills | Status: DC

## 2018-11-24 NOTE — Telephone Encounter (Signed)
Sent meds to wrong pharmacy

## 2018-11-24 NOTE — Telephone Encounter (Signed)
Urine culture was positive for PROTEUS MIRABILIS and was given KEFLEX  at urgent care visit.   Bacterial vaginosis is positive. This was not treated at the urgent care visit.  Flagyl 500 mg BID x 7 days #14 no refills sent to patients pharmacy of choice.    Attempted to reach patient. No answer at this time. Voicemail left.

## 2018-11-24 NOTE — Telephone Encounter (Signed)
Pt called back about results. All questions answered.

## 2018-12-09 ENCOUNTER — Other Ambulatory Visit: Payer: Self-pay

## 2018-12-09 ENCOUNTER — Encounter (HOSPITAL_COMMUNITY): Payer: Self-pay | Admitting: Emergency Medicine

## 2018-12-09 ENCOUNTER — Telehealth (HOSPITAL_COMMUNITY): Payer: Self-pay | Admitting: Family Medicine

## 2018-12-09 ENCOUNTER — Ambulatory Visit (HOSPITAL_COMMUNITY)
Admission: EM | Admit: 2018-12-09 | Discharge: 2018-12-09 | Disposition: A | Discharge status: Home / Self Care | Payer: Medicaid Other | Attending: Family Medicine | Admitting: Family Medicine

## 2018-12-09 DIAGNOSIS — I10 Essential (primary) hypertension: Secondary | ICD-10-CM

## 2018-12-09 DIAGNOSIS — R300 Dysuria: Secondary | ICD-10-CM

## 2018-12-09 DIAGNOSIS — R102 Pelvic and perineal pain: Secondary | ICD-10-CM | POA: Insufficient documentation

## 2018-12-09 DIAGNOSIS — N10 Acute tubulo-interstitial nephritis: Secondary | ICD-10-CM | POA: Insufficient documentation

## 2018-12-09 DIAGNOSIS — R350 Frequency of micturition: Secondary | ICD-10-CM

## 2018-12-09 LAB — POCT URINALYSIS DIP (DEVICE)
Bilirubin Urine: NEGATIVE
GLUCOSE, UA: NEGATIVE mg/dL
Ketones, ur: NEGATIVE mg/dL
LEUKOCYTES UA: NEGATIVE
NITRITE: NEGATIVE
Protein, ur: 30 mg/dL — AB
Specific Gravity, Urine: 1.025 (ref 1.005–1.030)
UROBILINOGEN UA: 1 mg/dL (ref 0.0–1.0)
pH: 6 (ref 5.0–8.0)

## 2018-12-09 MED ORDER — IBUPROFEN 800 MG PO TABS: 800.0000 mg | ORAL_TABLET | Freq: Three times a day (TID) | ORAL | 0 refills | Status: DC

## 2018-12-09 MED ORDER — FLUCONAZOLE 150 MG PO TABS: ORAL_TABLET | ORAL | 0 refills | Status: DC

## 2018-12-09 MED ORDER — CEFTRIAXONE SODIUM 1 G IJ SOLR
INTRAMUSCULAR | Status: AC
  Filled 2018-12-09: qty 10

## 2018-12-09 MED ORDER — LIDOCAINE HCL (PF) 1 % IJ SOLN
INTRAMUSCULAR | Status: AC
  Filled 2018-12-09: qty 2

## 2018-12-09 MED ORDER — ONDANSETRON 4 MG PO TBDP
4.0000 mg | ORAL_TABLET | Freq: Once | ORAL | Status: AC
  Administered 2018-12-09: 4 mg via ORAL

## 2018-12-09 MED ORDER — CEFTRIAXONE SODIUM 1 G IJ SOLR
1.0000 g | Freq: Once | INTRAMUSCULAR | Status: AC
  Administered 2018-12-09: 1 g via INTRAMUSCULAR

## 2018-12-09 MED ORDER — ONDANSETRON 4 MG PO TBDP
ORAL_TABLET | ORAL | Status: AC
  Filled 2018-12-09: qty 1

## 2018-12-09 MED ORDER — CEFUROXIME AXETIL 250 MG PO TABS: 250.0000 mg | ORAL_TABLET | Freq: Two times a day (BID) | ORAL | 0 refills | Status: DC

## 2018-12-09 MED ORDER — ONDANSETRON HCL 4 MG PO TABS: 4.0000 mg | ORAL_TABLET | Freq: Four times a day (QID) | ORAL | 0 refills | Status: DC

## 2018-12-09 NOTE — ED Triage Notes (Signed)
Pt was diagnosed with BV two weeks ago.  Pt has taken Keflex and Flagyl.  Pt is here today with lower abdominal pain and bilateral flank pain along with body aches and nausea for 3 days.

## 2018-12-09 NOTE — Discharge Instructions (Addendum)
Drink plenty of water Zofran as needed for nausea and vomiting. Take antibiotic 2 times a day for 10 days. Take ibuprofen 3 times a day with food as needed for pain Return promptly if worse instead of better, or if symptoms do not improve over next few days

## 2018-12-09 NOTE — ED Provider Notes (Signed)
[facility]    CSN: [id number] Arrival date & time: 12/09/18  1054     History   Chief Complaint Chief Complaint  Patient presents with  . Abdominal Pain  . Back Pain    HPI Sabrina Larson is a 43 y.o. female.   HPI  Patient was diagnosed with BV 2 weeks ago.  She took 7 days of metronidazole.  Her urine culture came back positive a couple days later.  She took 5 days of Keflex.  After this she developed a yeast infection and took 2 days of Diflucan.  She felt like she was well at that point.  Right now she states she has been sick for the last 2 days.  She has dysuria.  Frequency.  She feels like she has to urinate and then only goes "a dribble".  She has nausea.  Fever.  She also has bilateral back pain.  Never had a kidney stone.  She did have kidney infections with her pregnancies.  Is certain she is not pregnant at this time.  She has some crampy lower abdominal pain that comes and goes.  No vaginal discharge.  No suspicion of STD or vaginal infection.  Past Medical History:  Diagnosis Date  . GERD (gastroesophageal reflux disease)   . Hypertension   . Prediabetes     Patient Active Problem List   Diagnosis Date Noted  . History of gastric ulcer 12/24/2017  . GERD (gastroesophageal reflux disease) 01/22/2017  . Acute conjunctivitis of both eyes 01/22/2017  . Hypertension 07/05/2016  . Prediabetes 05/02/2016    Past Surgical History:  Procedure Laterality Date  . CESAREAN SECTION     2014, 1995  . DILATION AND CURETTAGE OF UTERUS     8 total in office and outpatient  . SKIN GRAFT     at age 26    OB History    Gravida  75   Para  6   Term  6   Preterm  0   AB  8   Living  6     SAB  0   TAB  0   Ectopic  0   Multiple  0   Live Births           Obstetric Comments  TSVD x 4. Term c-section x 2. ?8 D&Cs         Home Medications    Prior to Admission medications   Medication Sig Start Date End Date Taking?  Authorizing Provider  cefUROXime (CEFTIN) 250 MG tablet Take 1 tablet (250 mg total) by mouth 2 (two) times daily with a meal. 12/09/18   Raylene Everts, MD  Cetirizine HCl 10 MG CAPS Take 1 capsule (10 mg total) by mouth daily for 20 days. 08/31/18 09/20/18  Wurst, Marye Round, PA-C  famotidine (PEPCID) 20 MG tablet Take 1 tablet (20 mg total) by mouth 2 (two) times daily before a meal. 12/24/17   Alfonse Spruce, FNP  fluconazole (DIFLUCAN) 150 MG tablet Take one tablet at completion of antibiotics and the second tablet three days later 12/09/18   Raylene Everts, MD  fluticasone [facility]) 50 MCG/ACT nasal spray Place 1-2 sprays into both nostrils daily. 08/31/18   Wurst, [place], PA-C  ibuprofen (ADVIL,MOTRIN) 800 MG tablet Take 1 tablet (800 mg total) by mouth 3 (three) times daily. 12/09/18   Raylene Everts, MD  metFORMIN (GLUCOPHAGE) 500 MG tablet TAKE 1 TABLET(500 MG) BY MOUTH TWICE DAILY WITH A  MEAL 06/19/18   Argentina Donovan, PA-C  Multiple Vitamins-Minerals (MULTIVITAMIN WITH MINERALS) tablet Take 1 tablet by mouth daily.    [provider]  Olopatadine HCl 0.2 % SOLN Apply one drop into each affected eye daily 09/21/18   Gildardo Pounds, NP  ondansetron (ZOFRAN) 4 MG tablet Take 1 tablet (4 mg total) by mouth every 6 (six) hours. 12/09/18   Raylene Everts, MD    Family History Family History  Problem Relation Age of Onset  . Asthma Mother   . Hypertension Mother   . Asthma Brother     Social History Social History   Tobacco Use  . Smoking status: Current Every Day Smoker    Packs/day: 0.50  . Smokeless tobacco: Never Used  Substance Use Topics  . Alcohol use: Yes    Alcohol/week: 1.0 - 2.0 standard drinks    Types: 1 - 2 Glasses of wine per week    Comment: rarely   . Drug use: No     Allergies   Patient has no known allergies.   Review of Systems Review of Systems  Constitutional: Positive for fatigue and fever. Negative for chills.  HENT:  Negative for ear pain and sore throat.   Eyes: Negative for pain and visual disturbance.  Respiratory: Negative for cough and shortness of breath.   Cardiovascular: Negative for chest pain and palpitations.  Gastrointestinal: Positive for nausea. Negative for abdominal pain and vomiting.  Genitourinary: Positive for difficulty urinating, dysuria, flank pain and frequency. Negative for hematuria, menstrual problem, vaginal bleeding and vaginal discharge.  Musculoskeletal: Positive for back pain. Negative for arthralgias.  Skin: Negative for color change and rash.  Neurological: Negative for seizures and syncope.  All other systems reviewed and are negative.    Physical Exam Triage Vital Signs ED Triage Vitals  Enc Vitals Group     BP 12/09/18 1218 (!) 140/104     Pulse Rate 12/09/18 1218 (!) 107     Resp --      Temp 12/09/18 1218 (!) 100.4 F (38 C)     Temp Source 12/09/18 1218 Temporal     SpO2 12/09/18 1218 99 %     Weight --      Height --      Head Circumference --      Peak Flow --      Pain Score 12/09/18 1220 8     Pain Loc --      Pain Edu? --      Excl. in [place]? --    No data found.  Updated Vital Signs BP (!) 140/104 (BP Location: Left Arm)   Pulse (!) 107   Temp (!) 100.4 F (38 C) (Temporal)   LMP 11/29/2018 (Exact Date)   SpO2 99%   Visual Acuity Right Eye Distance:   Left Eye Distance:   Bilateral Distance:    Right Eye Near:   Left Eye Near:    Bilateral Near:     Physical Exam Constitutional:      General: She is not in acute distress.    Appearance: She is well-developed. She is ill-appearing.  HENT:     Head: Normocephalic and atraumatic.  Eyes:     Conjunctiva/sclera: Conjunctivae normal.     Pupils: Pupils are equal, round, and reactive to light.  Neck:     Musculoskeletal: Normal range of motion.  Cardiovascular:     Rate and Rhythm: Normal rate and regular rhythm.  Heart sounds: Normal heart sounds.  Pulmonary:     Effort:  Pulmonary effort is normal. No respiratory distress.     Breath sounds: Normal breath sounds.  Abdominal:     General: Bowel sounds are normal. There is no distension.     Palpations: Abdomen is soft.     Tenderness: There is abdominal tenderness in the right lower quadrant and left lower quadrant. There is no right CVA tenderness or left CVA tenderness.     Comments: Abdomen is soft.  Tenderness diffusely lower quadrants.  Not under the suprapubic region specifically.  She does have tenderness to deep palpation of the mid abdomen.  CVA tenderness is soft.  She also is tenderness over the low back and SI joint region.  Musculoskeletal: Normal range of motion.  Skin:    General: Skin is warm and dry.  Neurological:     General: No focal deficit present.     Mental Status: She is alert.  Psychiatric:        Mood and Affect: Mood normal. Mood is not anxious.      UC Treatments / Results  Labs (all labs ordered are listed, but only abnormal results are displayed) Labs Reviewed  POCT URINALYSIS DIP (DEVICE) - Abnormal; Notable for the following components:      Result Value   Hgb urine dipstick TRACE (*)    Protein, ur 30 (*)    All other components within normal limits  URINE CULTURE   Her urine test is not terribly remarkable, however, similar to what it was last time when she grew Proteus.  With her symptoms I believe she has a urinary tract infection, possible early pyelo-.  With the nausea and fever I have concern for kidney involvement.  I am going to treat her with a shot of Rocephin, a longer course of cephalosporins.  Did look up the antibiogram for our institution and cephalosporins are the drug of choice with the highest rate of resolution of Proteus infections. EKG None  Radiology No results found.  Procedures Procedures (including critical care time)  Medications Ordered in UC Medications  ondansetron (ZOFRAN-ODT) disintegrating tablet 4 mg (4 mg Oral Given 12/09/18  1326)  cefTRIAXone (ROCEPHIN) injection 1 g (1 g Intramuscular Given 12/09/18 1359)    Initial Impression / Assessment and Plan / UC Course  I have reviewed the triage vital signs and the nursing notes.  Pertinent labs & imaging results that were available during my care of the patient were reviewed by me and considered in my medical decision making (see chart for details).      Final Clinical Impressions(s) / UC Diagnoses   Final diagnoses:  Pelvic pain in female  Acute pyelonephritis     Discharge Instructions     Drink plenty of water Zofran as needed for nausea and vomiting. Take antibiotic 2 times a day for 10 days. Take ibuprofen 3 times a day with food as needed for pain Return promptly if worse instead of better, or if symptoms do not improve over next few days   ED Prescriptions    Medication Sig Dispense Auth. Provider   cefUROXime (CEFTIN) 250 MG tablet Take 1 tablet (250 mg total) by mouth 2 (two) times daily with a meal. 20 tablet Raylene Everts, MD   ondansetron (ZOFRAN) 4 MG tablet Take 1 tablet (4 mg total) by mouth every 6 (six) hours. 12 tablet Raylene Everts, MD   ibuprofen (ADVIL,MOTRIN) 800 MG tablet Take 1  tablet (800 mg total) by mouth 3 (three) times daily. 21 tablet Raylene Everts, MD     Controlled Substance Prescriptions [place] Controlled Substance Registry consulted? Not Applicable   Raylene Everts, MD 12/09/18 1422

## 2018-12-09 NOTE — Telephone Encounter (Signed)
Requests dilfucan for after antibiotics

## 2019-01-22 IMAGING — DX DG CHEST 2V
2 series · 2 of 2 positions shown · non-contrast
Comparison: The cardiomediastinal gradually at none.

CLINICAL DATA: Productive cough, headache, chest pain, and back
pain for 3 days.

EXAM:
CHEST - 2 VIEW

[chest pa]
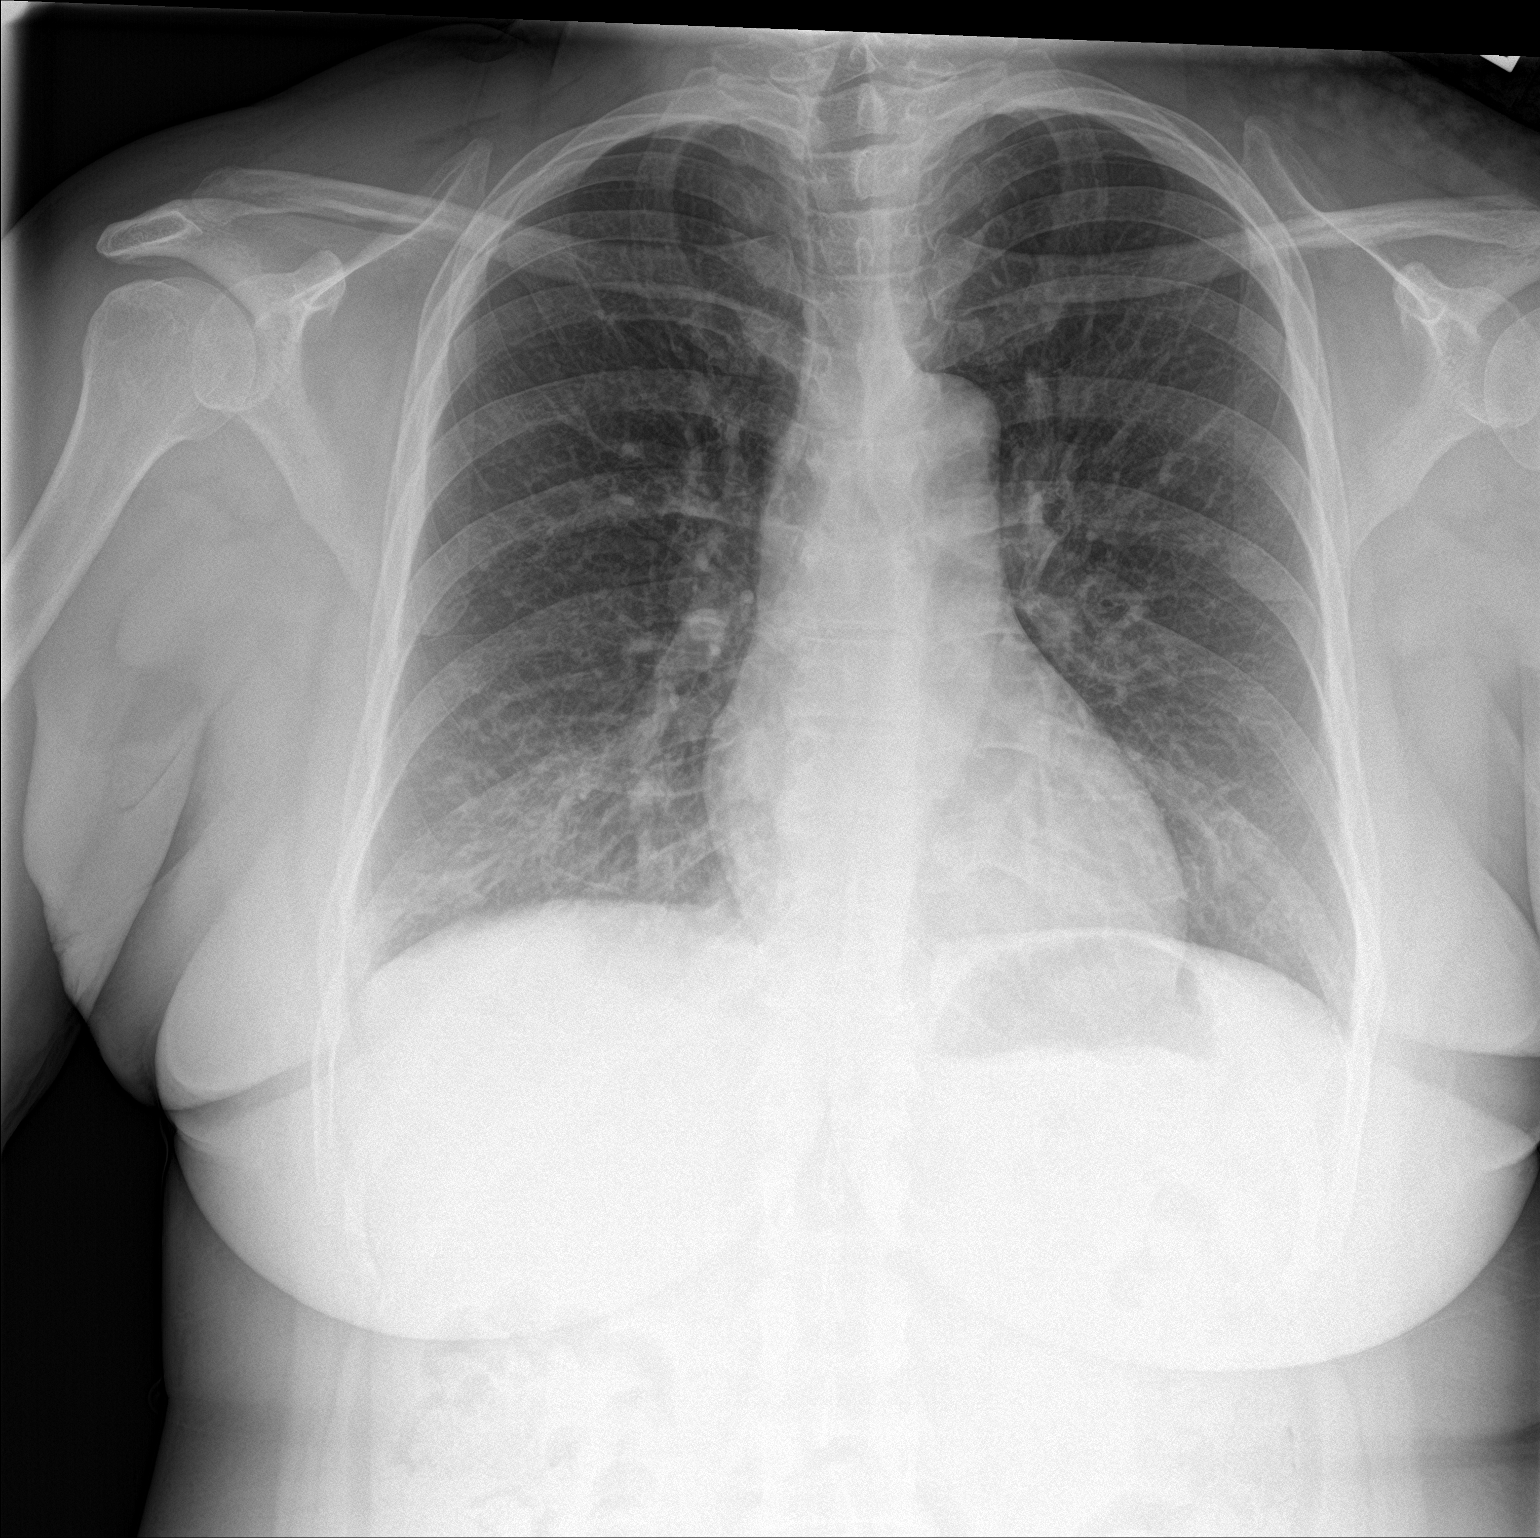

[chest lat]
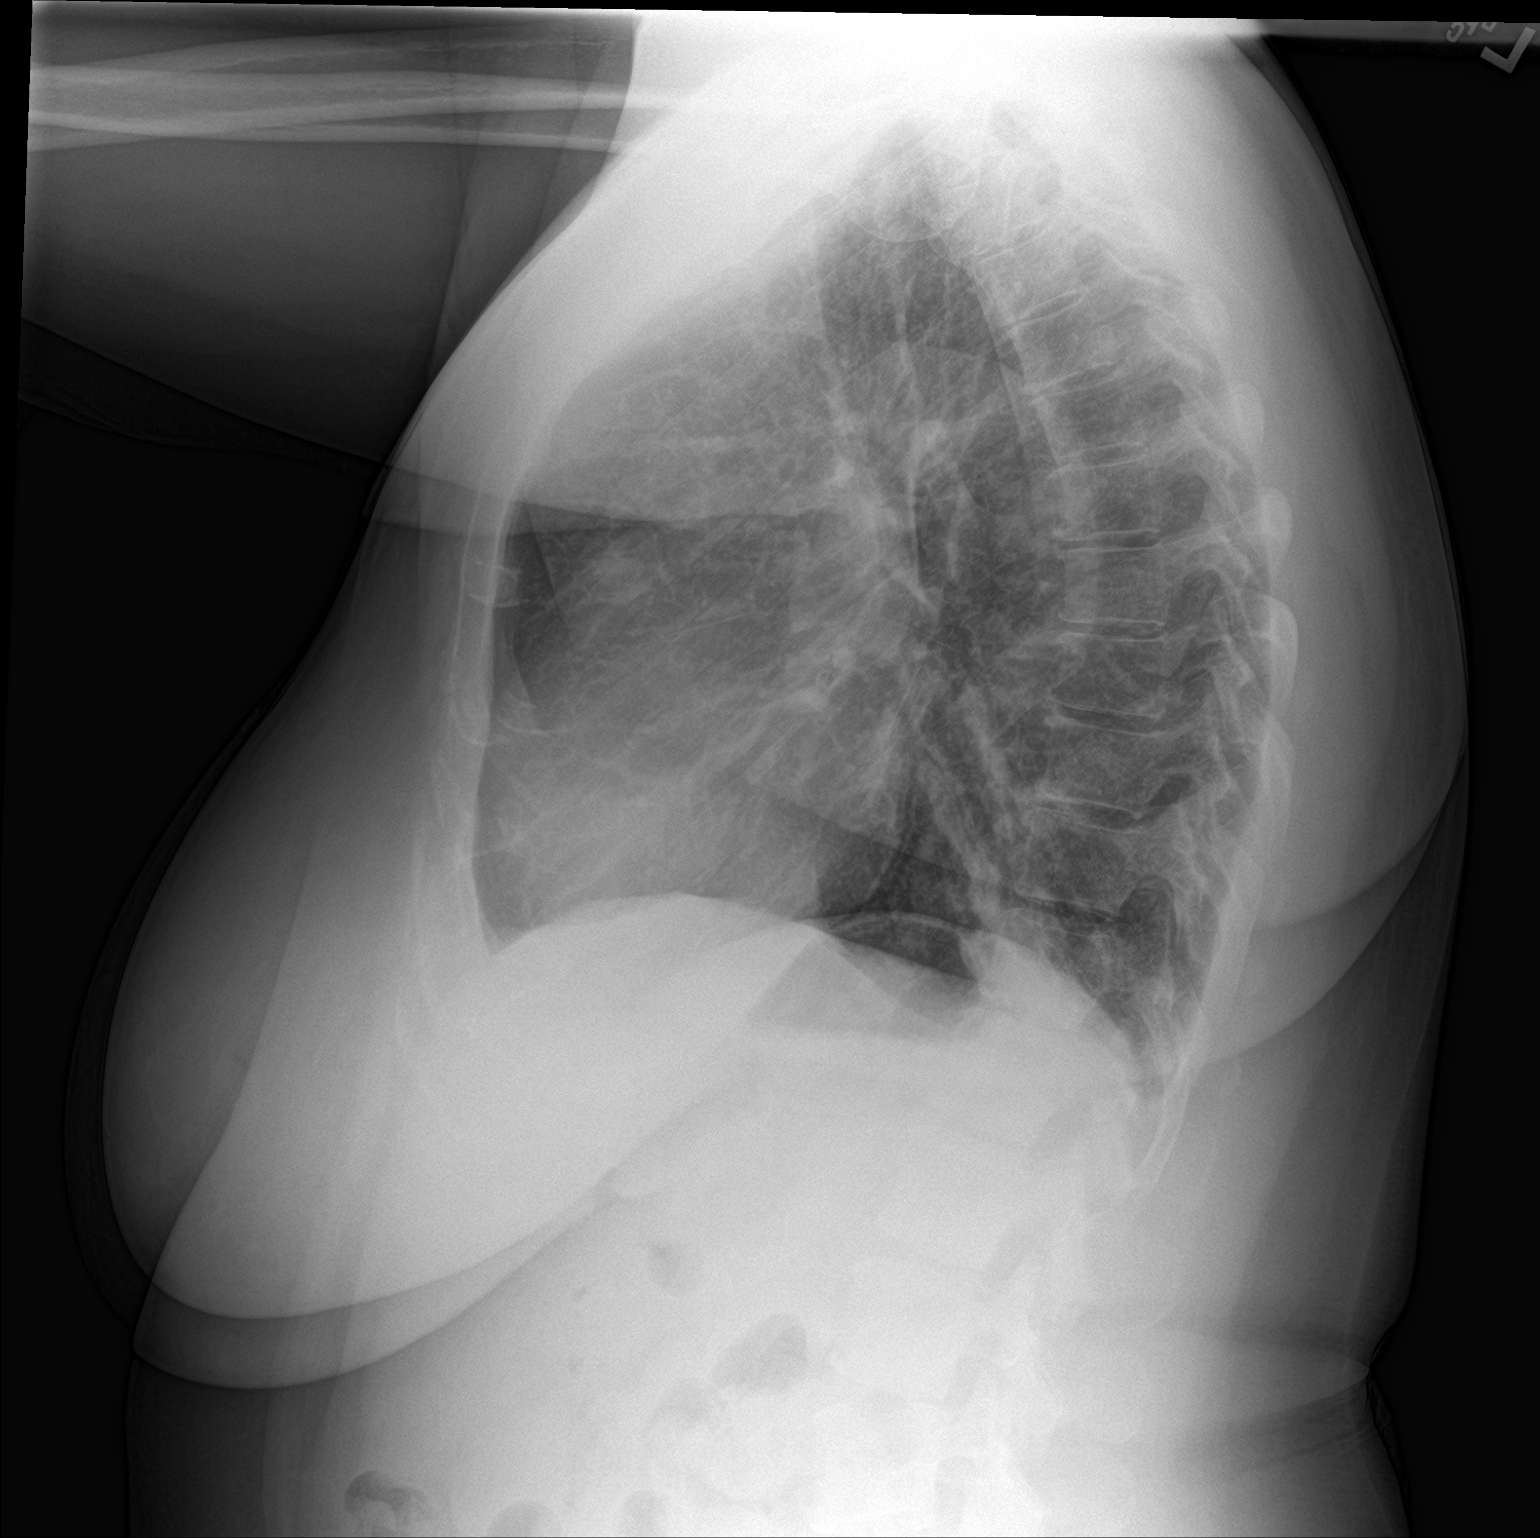

[2 of 2 positions shown; findings below may reference images not displayed]

FINDINGS: The cardiomediastinal silhouette is within normal limits. There is
mild peribronchial thickening. Streaky opacity is present in the
right lower lobe. No pleural effusion or pneumothorax is identified.
No acute osseous abnormality is seen.
IMPRESSION: Streaky right lower lobe opacity, suspicious for pneumonia.

## 2019-03-05 ENCOUNTER — Encounter (HOSPITAL_COMMUNITY): Payer: Self-pay | Admitting: *Deleted

## 2019-03-05 ENCOUNTER — Inpatient Hospital Stay (HOSPITAL_COMMUNITY)
Admission: AD | Admit: 2019-03-05 | Discharge: 2019-03-05 | Disposition: A | Discharge status: Home / Self Care | Payer: Medicaid Other | Attending: Obstetrics & Gynecology | Admitting: Obstetrics & Gynecology

## 2019-03-05 ENCOUNTER — Other Ambulatory Visit: Payer: Self-pay

## 2019-03-05 ENCOUNTER — Inpatient Hospital Stay (HOSPITAL_COMMUNITY): Payer: Medicaid Other

## 2019-03-05 DIAGNOSIS — O209 Hemorrhage in early pregnancy, unspecified: Secondary | ICD-10-CM | POA: Diagnosis not present

## 2019-03-05 DIAGNOSIS — O26891 Other specified pregnancy related conditions, first trimester: Secondary | ICD-10-CM | POA: Insufficient documentation

## 2019-03-05 DIAGNOSIS — O161 Unspecified maternal hypertension, first trimester: Secondary | ICD-10-CM | POA: Insufficient documentation

## 2019-03-05 DIAGNOSIS — Z791 Long term (current) use of non-steroidal anti-inflammatories (NSAID): Secondary | ICD-10-CM | POA: Insufficient documentation

## 2019-03-05 DIAGNOSIS — Z3A01 Less than 8 weeks gestation of pregnancy: Secondary | ICD-10-CM | POA: Diagnosis not present

## 2019-03-05 DIAGNOSIS — K219 Gastro-esophageal reflux disease without esophagitis: Secondary | ICD-10-CM | POA: Insufficient documentation

## 2019-03-05 DIAGNOSIS — O23591 Infection of other part of genital tract in pregnancy, first trimester: Secondary | ICD-10-CM | POA: Insufficient documentation

## 2019-03-05 DIAGNOSIS — O3411 Maternal care for benign tumor of corpus uteri, first trimester: Secondary | ICD-10-CM | POA: Insufficient documentation

## 2019-03-05 DIAGNOSIS — Z7984 Long term (current) use of oral hypoglycemic drugs: Secondary | ICD-10-CM | POA: Diagnosis not present

## 2019-03-05 DIAGNOSIS — Z79899 Other long term (current) drug therapy: Secondary | ICD-10-CM | POA: Insufficient documentation

## 2019-03-05 DIAGNOSIS — O99611 Diseases of the digestive system complicating pregnancy, first trimester: Secondary | ICD-10-CM | POA: Diagnosis not present

## 2019-03-05 DIAGNOSIS — O9989 Other specified diseases and conditions complicating pregnancy, childbirth and the puerperium: Secondary | ICD-10-CM | POA: Diagnosis not present

## 2019-03-05 DIAGNOSIS — R7303 Prediabetes: Secondary | ICD-10-CM | POA: Insufficient documentation

## 2019-03-05 DIAGNOSIS — D251 Intramural leiomyoma of uterus: Secondary | ICD-10-CM | POA: Diagnosis not present

## 2019-03-05 DIAGNOSIS — B9689 Other specified bacterial agents as the cause of diseases classified elsewhere: Secondary | ICD-10-CM | POA: Diagnosis not present

## 2019-03-05 DIAGNOSIS — F17210 Nicotine dependence, cigarettes, uncomplicated: Secondary | ICD-10-CM | POA: Insufficient documentation

## 2019-03-05 DIAGNOSIS — O3680X0 Pregnancy with inconclusive fetal viability, not applicable or unspecified: Secondary | ICD-10-CM

## 2019-03-05 DIAGNOSIS — N760 Acute vaginitis: Secondary | ICD-10-CM

## 2019-03-05 DIAGNOSIS — O99331 Smoking (tobacco) complicating pregnancy, first trimester: Secondary | ICD-10-CM | POA: Diagnosis not present

## 2019-03-05 DIAGNOSIS — O98511 Other viral diseases complicating pregnancy, first trimester: Secondary | ICD-10-CM

## 2019-03-05 DIAGNOSIS — R109 Unspecified abdominal pain: Secondary | ICD-10-CM

## 2019-03-05 LAB — ABO/RH: ABO/RH(D): B POS

## 2019-03-05 LAB — URINALYSIS, ROUTINE W REFLEX MICROSCOPIC
Bilirubin Urine: NEGATIVE
Glucose, UA: 150 mg/dL — AB
Ketones, ur: NEGATIVE mg/dL
Nitrite: NEGATIVE
Protein, ur: NEGATIVE mg/dL
Specific Gravity, Urine: 1.021 (ref 1.005–1.030)
pH: 5 (ref 5.0–8.0)

## 2019-03-05 LAB — CBC
HCT: 39.9 % (ref 36.0–46.0)
Hemoglobin: 13 g/dL (ref 12.0–15.0)
MCH: 26.5 pg (ref 26.0–34.0)
MCHC: 32.6 g/dL (ref 30.0–36.0)
MCV: 81.4 fL (ref 80.0–100.0)
Platelets: 300 10*3/uL (ref 150–400)
RBC: 4.9 MIL/uL (ref 3.87–5.11)
RDW: 14.4 % (ref 11.5–15.5)
WBC: 7.7 10*3/uL (ref 4.0–10.5)
nRBC: 0 % (ref 0.0–0.2)

## 2019-03-05 LAB — POCT PREGNANCY, URINE: Preg Test, Ur: POSITIVE — AB

## 2019-03-05 LAB — WET PREP, GENITAL
Sperm: NONE SEEN
Trich, Wet Prep: NONE SEEN
Yeast Wet Prep HPF POC: NONE SEEN

## 2019-03-05 LAB — HCG, QUANTITATIVE, PREGNANCY: hCG, Beta Chain, Quant, S: 223 m[IU]/mL — ABNORMAL HIGH (ref <5)

## 2019-03-05 MED ORDER — TERCONAZOLE 0.8 % VA CREA: 1.0000 | TOPICAL_CREAM | Freq: Every day | VAGINAL | 0 refills | Status: DC

## 2019-03-05 MED ORDER — METRONIDAZOLE 500 MG PO TABS: 500.0000 mg | ORAL_TABLET | Freq: Two times a day (BID) | ORAL | 0 refills | Status: DC

## 2019-03-05 NOTE — MAU Provider Note (Signed)
Chief Complaint: Vaginal Bleeding and Urinary Tract Infection   First Provider Initiated Contact with Patient 03/05/19 1422     SUBJECTIVE HPI: Sabrina Larson is a 43 y.o. [id number] at [redacted]w[redacted]d who presents to Maternity Admissions reporting vaginal bleeding. Symptoms began this morning. Sees bright red blood on toilet paper when she wipes. Not wearing a pad and not passing blood clots. Also reports pain with urination when the urine touches her vulva. Denies vaginal discharge, itching, or irritation.  Some low back pain that is worse in left lower back & radiates to her lower abdomen.   Location: low back & abdomen Quality: cramping, discomfort Severity: 4/10 on pain scale Duration: 1 day Timing: intermittent Modifying factors: none Associated signs and symptoms: vaginal bleeding  Past Medical History:  Diagnosis Date  . GERD (gastroesophageal reflux disease)   . Hypertension   . Prediabetes    OB History  Gravida Para Term Preterm AB Living  15 6 6  0 8 6  SAB TAB Ectopic Multiple Live Births  0 8 0 0      # Outcome Date GA Lbr Len/2nd Weight Sex Delivery Anes PTL Lv  15 Current           14 Term 2014     CS-LTranv     13 Term 2011     VBAC     12 Term 2006     VBAC     11 Term 1999     VBAC     10 Term 78     VBAC     9 Term 50     CS-LTranv     8 TAB           7 TAB           6 TAB           5 TAB           4 TAB           3 TAB           2 TAB           1 TAB             Obstetric Comments  TSVD x 4. Term c-section x 2.   6 surgical TABs, 2 medicated TABs   Past Surgical History:  Procedure Laterality Date  . CESAREAN SECTION     2014, 1995  . DILATION AND CURETTAGE OF UTERUS     8 total in office and outpatient  . SKIN GRAFT     at age 37   Social History   Socioeconomic History  . Marital status: Single    Spouse name: Not on file  . Number of children: Not on file  . Years of education: Not on file  . Highest education level: Not on file   Occupational History  . Not on file  Social Needs  . Financial resource strain: Not on file  . Food insecurity:    Worry: Not on file    Inability: Not on file  . Transportation needs:    Medical: Not on file    Non-medical: Not on file  Tobacco Use  . Smoking status: Current Every Day Smoker    Packs/day: 0.50  . Smokeless tobacco: Never Used  Substance and Sexual Activity  . Alcohol use: Yes    Alcohol/week: 1.0 - 2.0 standard drinks    Types: 1 - 2 Glasses of wine per week  Comment: rarely   . Drug use: No  . Sexual activity: Yes  Lifestyle  . Physical activity:    Days per week: Not on file    Minutes per session: Not on file  . Stress: Not on file  Relationships  . Social connections:    Talks on phone: Not on file    Gets together: Not on file    Attends religious service: Not on file    Active member of club or organization: Not on file    Attends meetings of clubs or organizations: Not on file    Relationship status: Not on file  . Intimate partner violence:    Fear of current or ex partner: Not on file    Emotionally abused: Not on file    Physically abused: Not on file    Forced sexual activity: Not on file  Other Topics Concern  . Not on file  Social History Narrative  . Not on file   Family History  Problem Relation Age of Onset  . Asthma Mother   . Hypertension Mother   . Asthma Brother    No current facility-administered medications on file prior to encounter.    Current Outpatient Medications on File Prior to Encounter  Medication Sig Dispense Refill  . cefUROXime (CEFTIN) 250 MG tablet Take 1 tablet (250 mg total) by mouth 2 (two) times daily with a meal. 20 tablet 0  . Cetirizine HCl 10 MG CAPS Take 1 capsule (10 mg total) by mouth daily for 20 days. 20 capsule 0  . famotidine (PEPCID) 20 MG tablet Take 1 tablet (20 mg total) by mouth 2 (two) times daily before a meal. 60 tablet 5  . fluconazole (DIFLUCAN) 150 MG tablet Take one tablet at  completion of antibiotics and the second tablet three days later 2 tablet 0  . fluticasone (FLONASE) 50 MCG/ACT nasal spray Place 1-2 sprays into both nostrils daily. 16 g 0  . ibuprofen (ADVIL,MOTRIN) 800 MG tablet Take 1 tablet (800 mg total) by mouth 3 (three) times daily. 21 tablet 0  . metFORMIN (GLUCOPHAGE) 500 MG tablet TAKE 1 TABLET(500 MG) BY MOUTH TWICE DAILY WITH A MEAL 60 tablet 5  . Multiple Vitamins-Minerals (MULTIVITAMIN WITH MINERALS) tablet Take 1 tablet by mouth daily.    . Olopatadine HCl 0.2 % SOLN Apply one drop into each affected eye daily 2.5 mL 1  . ondansetron (ZOFRAN) 4 MG tablet Take 1 tablet (4 mg total) by mouth every 6 (six) hours. 12 tablet 0   No Known Allergies  I have reviewed patient's Past Medical Hx, Surgical Hx, Family Hx, Social Hx, medications and allergies.   Review of Systems  Constitutional: Negative.   Gastrointestinal: Positive for abdominal pain. Negative for constipation, diarrhea, nausea and vomiting.  Genitourinary: Positive for vaginal bleeding. Negative for dysuria and vaginal discharge.  Musculoskeletal: Positive for back pain.    OBJECTIVE Patient Vitals for the past 24 hrs:  BP Temp Pulse Resp SpO2  03/05/19 1540 137/85 - 84 16 -  03/05/19 1400 - - - - 99 %  03/05/19 1359 (!) 152/91 97.7 F (36.5 C) (!) 101 16 -   Constitutional: Well-developed, well-nourished female in no acute distress.  Cardiovascular: normal rate & rhythm, no murmur Respiratory: normal rate and effort. Lung sounds clear throughout GI: Abd soft, non-tender, Pos BS x 4. No guarding or rebound tenderness MS: Extremities nontender, no edema, normal ROM Neurologic: Alert and oriented x 4.  GU:  SPECULUM EXAM: NEFG, small amount of dark red mucoid blood. No active bleeding.   BIMANUAL: No CMT. cervix closed; uterus normal size, no adnexal tenderness or masses.    LAB RESULTS Results for orders placed or performed during the hospital encounter of 03/05/19  (from the past 24 hour(s))  Urinalysis, Routine w reflex microscopic     Status: Abnormal   Collection Time: 03/05/19  1:48 PM  Result Value Ref Range   Color, Urine YELLOW YELLOW   APPearance HAZY (A) CLEAR   Specific Gravity, Urine 1.021 1.005 - 1.030   pH 5.0 5.0 - 8.0   Glucose, UA 150 (A) NEGATIVE mg/dL   Hgb urine dipstick MODERATE (A) NEGATIVE   Bilirubin Urine NEGATIVE NEGATIVE   Ketones, ur NEGATIVE NEGATIVE mg/dL   Protein, ur NEGATIVE NEGATIVE mg/dL   Nitrite NEGATIVE NEGATIVE   Leukocytes,Ua TRACE (A) NEGATIVE   RBC / HPF 0-5 0 - 5 RBC/hpf   WBC, UA 11-20 0 - 5 WBC/hpf   Bacteria, UA RARE (A) NONE SEEN   Squamous Epithelial / LPF 0-5 0 - 5   Mucus PRESENT   Pregnancy, urine POC     Status: Abnormal   Collection Time: 03/05/19  1:52 PM  Result Value Ref Range   Preg Test, Ur POSITIVE (A) NEGATIVE  ABO/Rh     Status: None   Collection Time: 03/05/19  2:43 PM  Result Value Ref Range   ABO/RH(D) B POS    No rh immune globuloin      NOT A RH IMMUNE GLOBULIN CANDIDATE, PT RH POSITIVE Performed at [facility], [address], [place], [place] [address]   CBC     Status: None   Collection Time: 03/05/19  2:44 PM  Result Value Ref Range   WBC 7.7 4.0 - 10.5 K/uL   RBC 4.90 3.87 - 5.11 MIL/uL   Hemoglobin 13.0 12.0 - 15.0 g/dL   HCT 39.9 36.0 - 46.0 %   MCV 81.4 80.0 - 100.0 fL   MCH 26.5 26.0 - 34.0 pg   MCHC 32.6 30.0 - 36.0 g/dL   RDW 14.4 11.5 - 15.5 %   Platelets 300 150 - 400 K/uL   nRBC 0.0 0.0 - 0.2 %  hCG, quantitative, pregnancy     Status: Abnormal   Collection Time: 03/05/19  2:44 PM  Result Value Ref Range   hCG, Beta Chain, Quant, S 223 (H) <5 mIU/mL  Wet prep, genital     Status: Abnormal   Collection Time: 03/05/19  2:55 PM  Result Value Ref Range   Yeast Wet Prep HPF POC NONE SEEN NONE SEEN   Trich, Wet Prep NONE SEEN NONE SEEN   Clue Cells Wet Prep HPF POC PRESENT (A) NONE SEEN   WBC, Wet Prep HPF POC MANY (A) NONE SEEN   Sperm NONE  SEEN     IMAGING US Ob Less Than 14 Weeks With Ob Transvaginal  Result Date: 03/05/2019 CLINICAL DATA:  Abdominal pain and vaginal bleeding. Estimated gestational age of [redacted] weeks, 0 days by LMP. EXAM: OBSTETRIC <14 WK [place] AND TRANSVAGINAL OB US TECHNIQUE: Both transabdominal and transvaginal ultrasound examinations were performed for complete evaluation of the gestation as well as the maternal uterus, adnexal regions, and pelvic cul-de-sac. Transvaginal technique was performed to assess early pregnancy. COMPARISON:  Pelvic ultrasound dated July 05, 2016. FINDINGS: Intrauterine gestational sac: None Yolk sac:  Not Visualized. Embryo:  Not Visualized. Maternal uterus/adnexae: Unchanged 1.2 cm calcified anterior  intramural uterine fibroid. The right ovary is unremarkable. The left ovary is not visualized. No free fluid in the pelvis. IMPRESSION: 1.  No IUP is visualized. By definition, in the setting of a positive pregnancy test, this reflects a pregnancy of unknown location. Differential considerations include early normal IUP, abnormal IUP/missed abortion, or nonvisualized ectopic pregnancy. Serial beta HCG is suggested. Consider repeat pelvic ultrasound in 14 days. 2. Nonvisualized left ovary.  Unremarkable right ovary. 3. Unchanged small calcified uterine fibroid. Electronically Signed   By: Titus Dubin M.D.   On: 03/05/2019 15:38    MAU COURSE Orders Placed This Encounter  Procedures  . Wet prep, genital  . Culture, OB Urine  . US OB LESS THAN 14 WEEKS WITH OB TRANSVAGINAL  . Urinalysis, Routine w reflex microscopic  . CBC  . hCG, quantitative, pregnancy  . Pregnancy, urine POC  . ABO/Rh  . Discharge patient   Meds ordered this encounter  Medications  . metroNIDAZOLE (FLAGYL) 500 MG tablet    Sig: Take 1 tablet (500 mg total) by mouth 2 (two) times daily.    Dispense:  14 tablet    Refill:  0    Order Specific Question:   Supervising Provider    Answer:   [place] [facility] [[id number]]  .  terconazole (TERAZOL 3) 0.8 % vaginal cream    Sig: Place 1 applicator vaginally at bedtime.    Dispense:  20 g    Refill:  0    Order Specific Question:   Supervising Provider    Answer:   [place] [facility] [[id number]]    MDM +UPT UA, wet prep, GC/chlamydia, CBC, ABO/Rh, quant hCG, and [place] today to rule out ectopic pregnancy  Wet prep + clue cells. Will treat for BV. Patient requests yeast meds d/t hx of yeast after taking flagyl. Terazol sent to pharmacy.   Rh positive. Minimal bleeding on exam today.  Ultrasound shows no IUP or adnexal mass & HCG today is only 223. Can't exclude pregnancy too early to be seen, miscarriage, or ectopic pregnancy. Will bring patient back on Sunday for repeat HCG.   U/a with some leuks & hemoglobin. No dysuria. Some increased frequency but unsure if related to pregnancy. Afebrile and no CVAT. Will send urine for culture.   ASSESSMENT 1. Pregnancy of unknown anatomic location   2. Vaginal bleeding in pregnancy, first trimester   3. Abdominal pain during pregnancy in first trimester   4. BV (bacterial vaginosis)     PLAN Discharge home in stable condition. SAB vs ectopic precautions GC/CT & urine culture pending  Follow-up Information    [facility]. Go on 03/08/2019.   Specialty:  Obstetrics and Gynecology Why:  for blood work Contact information: [address] [phone] [place] [phone]         Allergies as of 03/05/2019   No Known Allergies     Medication List    STOP taking these medications   cefUROXime 250 MG tablet Commonly known as:  Ceftin   Cetirizine HCl 10 MG Caps   famotidine 20 MG tablet Commonly known as:  PEPCID   fluconazole 150 MG tablet Commonly known as:  Diflucan   ibuprofen 800 MG tablet Commonly known as:  ADVIL,MOTRIN   multivitamin with minerals tablet   Olopatadine HCl 0.2 % Soln   ondansetron 4 MG tablet Commonly known as:  ZOFRAN      TAKE these medications   fluticasone 50 MCG/ACT  nasal spray Commonly known as:  FLONASE Place 1-2 sprays into both nostrils daily.   metFORMIN 500 MG tablet Commonly known as:  GLUCOPHAGE TAKE 1 TABLET(500 MG) BY MOUTH TWICE DAILY WITH A MEAL   metroNIDAZOLE 500 MG tablet Commonly known as:  FLAGYL Take 1 tablet (500 mg total) by mouth 2 (two) times daily.   terconazole 0.8 % vaginal cream Commonly known as:  Terazol 3 Place 1 applicator vaginally at bedtime.        Jorje Guild, NP 03/05/2019  4:18 PM

## 2019-03-05 NOTE — Discharge Instructions (Signed)
Return to care   If you have heavier bleeding that soaks through more that 2 pads per hour for an hour or more  If you bleed so much that you feel like you might pass out or you do pass out  If you have significant abdominal pain that is not improved with Tylenol     Bacterial Vaginosis  Bacterial vaginosis is a vaginal infection that occurs when the normal balance of bacteria in the vagina is disrupted. It results from an overgrowth of certain bacteria. This is the most common vaginal infection among women ages 69-44. Because bacterial vaginosis increases your risk for STIs (sexually transmitted infections), getting treated can help reduce your risk for chlamydia, gonorrhea, herpes, and HIV (human immunodeficiency virus). Treatment is also important for preventing complications in pregnant women, because this condition can cause an early (premature) delivery. What are the causes? This condition is caused by an increase in harmful bacteria that are normally present in small amounts in the vagina. However, the reason that the condition develops is not fully understood. What increases the risk? The following factors may make you more likely to develop this condition:  Having a new sexual partner or multiple sexual partners.  Having unprotected sex.  Douching.  Having an intrauterine device (IUD).  Smoking.  Drug and alcohol abuse.  Taking certain antibiotic medicines.  Being pregnant. You cannot get bacterial vaginosis from toilet seats, bedding, swimming pools, or contact with objects around you. What are the signs or symptoms? Symptoms of this condition include:  Grey or white vaginal discharge. The discharge can also be watery or foamy.  A fish-like odor with discharge, especially after sexual intercourse or during menstruation.  Itching in and around the vagina.  Burning or pain with urination. Some women with bacterial vaginosis have no signs or symptoms. How is this  diagnosed? This condition is diagnosed based on:  Your medical history.  A physical exam of the vagina.  Testing a sample of vaginal fluid under a microscope to look for a large amount of bad bacteria or abnormal cells. Your health care provider may use a cotton swab or a small wooden spatula to collect the sample. How is this treated? This condition is treated with antibiotics. These may be given as a pill, a vaginal cream, or a medicine that is put into the vagina (suppository). If the condition comes back after treatment, a second round of antibiotics may be needed. Follow these instructions at home: Medicines  Take over-the-counter and prescription medicines only as told by your health care provider.  Take or use your antibiotic as told by your health care provider. Do not stop taking or using the antibiotic even if you start to feel better. General instructions  If you have a female sexual partner, tell her that you have a vaginal infection. She should see her health care provider and be treated if she has symptoms. If you have a female sexual partner, he does not need treatment.  During treatment: ? Avoid sexual activity until you finish treatment. ? Do not douche. ? Avoid alcohol as directed by your health care provider. ? Avoid breastfeeding as directed by your health care provider.  Drink enough water and fluids to keep your urine clear or pale yellow.  Keep the area around your vagina and rectum clean. ? Wash the area daily with warm water. ? Wipe yourself from front to back after using the toilet.  Keep all follow-up visits as told by your health  care provider. This is important. How is this prevented?  Do not douche.  Wash the outside of your vagina with warm water only.  Use protection when having sex. This includes latex condoms and dental dams.  Limit how many sexual partners you have. To help prevent bacterial vaginosis, it is best to have sex with just one  partner (monogamous).  Make sure you and your sexual partner are tested for STIs.  Wear cotton or cotton-lined underwear.  Avoid wearing tight pants and pantyhose, especially during summer.  Limit the amount of alcohol that you drink.  Do not use any products that contain nicotine or tobacco, such as cigarettes and e-cigarettes. If you need help quitting, ask your health care provider.  Do not use illegal drugs. Where to find more information  Centers for Disease Control and Prevention: AppraiserFraud.fi  American Sexual Health Association (ASHA): www.ashastd.org  U.S. Department of Health and Financial controller, Office on Women's Health: DustingSprays.pl or SecuritiesCard.it Contact a health care provider if:  Your symptoms do not improve, even after treatment.  You have more discharge or pain when urinating.  You have a fever.  You have pain in your abdomen.  You have pain during sex.  You have vaginal bleeding between periods. Summary  Bacterial vaginosis is a vaginal infection that occurs when the normal balance of bacteria in the vagina is disrupted.  Because bacterial vaginosis increases your risk for STIs (sexually transmitted infections), getting treated can help reduce your risk for chlamydia, gonorrhea, herpes, and HIV (human immunodeficiency virus). Treatment is also important for preventing complications in pregnant women, because the condition can cause an early (premature) delivery.  This condition is treated with antibiotic medicines. These may be given as a pill, a vaginal cream, or a medicine that is put into the vagina (suppository). This information is not intended to replace advice given to you by your health care provider. Make sure you discuss any questions you have with your health care provider. Document Released: 11/19/2005 Document Revised: 03/25/2017 Document Reviewed: 08/04/2016 Elsevier Interactive Patient  Education  2019 [employer].

## 2019-03-05 NOTE — MAU Note (Signed)
Pt presents to MAU with complaints of vaginal bleeding that started this morning  Pain with urination.

## 2019-03-06 LAB — GC/CHLAMYDIA PROBE AMP (~~LOC~~) NOT AT ARMC
Chlamydia: NEGATIVE
Neisseria Gonorrhea: NEGATIVE

## 2019-03-08 ENCOUNTER — Inpatient Hospital Stay (HOSPITAL_COMMUNITY)
Admission: AD | Admit: 2019-03-08 | Discharge: 2019-03-08 | Disposition: A | Discharge status: Home / Self Care | Payer: Medicaid Other | Source: Ambulatory Visit | Attending: Obstetrics & Gynecology | Admitting: Obstetrics & Gynecology

## 2019-03-08 ENCOUNTER — Other Ambulatory Visit (HOSPITAL_COMMUNITY)
Admission: RE | Admit: 2019-03-08 | Discharge: 2019-03-08 | Disposition: A | Discharge status: Home / Self Care | Payer: Medicaid Other | Source: Ambulatory Visit | Attending: Obstetrics & Gynecology | Admitting: Obstetrics & Gynecology

## 2019-03-08 ENCOUNTER — Other Ambulatory Visit: Payer: Self-pay

## 2019-03-08 DIAGNOSIS — O209 Hemorrhage in early pregnancy, unspecified: Secondary | ICD-10-CM | POA: Diagnosis not present

## 2019-03-08 DIAGNOSIS — Z3A01 Less than 8 weeks gestation of pregnancy: Secondary | ICD-10-CM | POA: Diagnosis not present

## 2019-03-08 DIAGNOSIS — O2341 Unspecified infection of urinary tract in pregnancy, first trimester: Secondary | ICD-10-CM | POA: Diagnosis not present

## 2019-03-08 LAB — CULTURE, OB URINE: Culture: 40000 — AB

## 2019-03-08 LAB — HCG, QUANTITATIVE, PREGNANCY: hCG, Beta Chain, Quant, S: 646 m[IU]/mL — ABNORMAL HIGH (ref <5)

## 2019-03-08 MED ORDER — CEPHALEXIN 500 MG PO CAPS: 500.0000 mg | ORAL_CAPSULE | Freq: Four times a day (QID) | ORAL | 0 refills | Status: DC

## 2019-03-08 NOTE — MAU Note (Signed)
Sabrina Larson is a 43 y.o. at [redacted]w[redacted]d here in MAU reporting: here for follow up HCG and to find out about her urine culture, having some spotting but no pain  Pain score: 0/10  Vitals:   03/08/19 1220  BP: (!) 132/95  Pulse: 82  Resp: 18  Temp: 98.6 F (37 C)  SpO2: 100%

## 2019-03-08 NOTE — Discharge Instructions (Signed)
Return to care   If you have heavier bleeding that soaks through more that 2 pads per hour for an hour or more  If you bleed so much that you feel like you might pass out or you do pass out  If you have significant abdominal pain that is not improved with Tylenol      Pregnancy and Urinary Tract Infection What is a urinary tract infection?  A urinary tract infection (UTI) is an infection of any part of the urinary tract. This includes the kidneys, the tubes that connect your kidneys to your bladder (ureters), the bladder, and the tube that carries urine out of your body (urethra). These organs make, store, and get rid of urine in the body.  An upper UTI affects the ureters and kidneys (pyelonephritis), and a lower UTI affects the bladder (cystitis) and urethra (urethritis). Most urinary tract infections are caused by bacteria in your genital area, around the entrance to your urinary tract (urethra). These bacteria grow and cause irritation and inflammation of your urinary tract. Why am I more likely to get a UTI during pregnancy? You are more likely to develop a UTI during pregnancy because:  The physical and hormonal changes your body goes through can make it easier for bacteria to get into your urinary tract.  Your growing baby puts pressure on your uterus and can affect urine flow. Does a UTI place my baby at risk? An untreated UTI during pregnancy could lead to a kidney infection, which can cause health problems that could affect your baby. Possible complications of an untreated UTI include:  Having your baby before 37 weeks of pregnancy (premature).  Having a baby with a low birth weight.  Developing high blood pressure during pregnancy (preeclampsia).  Having a low hemoglobin level (anemia). What are the symptoms of a UTI? Symptoms of a UTI include:  Needing to urinate right away (urgently).  Frequent urination or passing small amounts of urine frequently.  Pain or  burning with urination.  Blood in the urine.  Urine that smells bad or unusual.  Trouble urinating.  Cloudy urine.  Pain in the abdomen or lower back.  Vaginal discharge. You may also have:  Vomiting or a decreased appetite.  Confusion.  Irritability or tiredness.  A fever.  Diarrhea. What are the treatment options for a UTI during pregnancy? Treatment for this condition may include:  Antibiotic medicines that are safe to take during pregnancy.  Other medicines to treat less common causes of UTI. How can I prevent a UTI? To prevent a UTI:  Go to the bathroom as soon as you feel the need. Do not hold urine for long periods of time.  Always wipe from front to back after a bowel movement. Use each tissue one time when you wipe.  Empty your bladder after sex.  Keep your genital area dry.  Drink 6-10 glasses of water each day.  Do not douche or use deodorant sprays. Contact a health care provider if:  Your symptoms do not improve or they get worse.  You have abnormal vaginal discharge. Get help right away if:  You have a fever.  You have nausea and vomiting.  You have back or side pain.  You feel contractions in your uterus.  You have lower belly pain.  You have a gush of fluid from your vagina.  You have blood in your urine. Summary  A urinary tract infection (UTI) is an infection of any part of the urinary tract,  which includes the kidneys, ureters, bladder, and urethra.  Most urinary tract infections are caused by bacteria in your genital area, around the entrance to your urinary tract (urethra).  You are more likely to develop a UTI during pregnancy.  If you were prescribed an antibiotic, take it as told by your health care provider. Do not stop taking the antibiotic even if you start to feel better. This information is not intended to replace advice given to you by your health care provider. Make sure you discuss any questions you have with  your health care provider. Document Released: 03/16/2011 Document Revised: 01/14/2018 Document Reviewed: 10/10/2015 Elsevier Interactive Patient Education  [employer].

## 2019-03-08 NOTE — MAU Provider Note (Signed)
Sabrina Larson  is a 43 y.o. [id number]  at [redacted]w[redacted]d who presents to MAU today for follow-up quant hCG. The patient denies abdominal pain, vaginal bleeding, N/V or fever.   BP (!) 132/95 (BP Location: Right Arm)   Pulse 82   Temp 98.6 F (37 C) (Oral)   Resp 18   Ht 5\' 6"  (1.676 m)   Wt 92.1 kg   LMP 01/22/2019   SpO2 100%   BMI 32.77 kg/m   GENERAL: Well-developed, well-nourished female in no acute distress.  HEENT: Normocephalic, atraumatic.   LUNGS: Effort normal HEART: Regular rate  SKIN: Warm, dry and without erythema PSYCH: Normal mood and affect   A: Appropriate rise in quant hCG after 48 hours 1. Vaginal bleeding in pregnancy, first trimester   2. Urinary tract infection in mother during first trimester of pregnancy  -urine culture came back positive from previous visit    P: Discharge home Bleeding/Ectopic precautions discussed Follow-up US ordered for 10 days Rx keflex Patient may return to MAU as needed or if her condition were to change or worsen   Jorje Guild, NP  03/08/2019 12:23 PM

## 2019-03-10 ENCOUNTER — Telehealth: Payer: Self-pay | Admitting: Certified Nurse Midwife

## 2019-03-10 ENCOUNTER — Telehealth (HOSPITAL_COMMUNITY): Payer: Self-pay

## 2019-03-19 ENCOUNTER — Encounter (HOSPITAL_COMMUNITY): Payer: Self-pay | Admitting: *Deleted

## 2019-03-19 ENCOUNTER — Inpatient Hospital Stay (HOSPITAL_COMMUNITY)
Admission: AD | Admit: 2019-03-19 | Discharge: 2019-03-19 | Disposition: A | Discharge status: Home / Self Care | Payer: Medicaid Other | Attending: Obstetrics and Gynecology | Admitting: Obstetrics and Gynecology

## 2019-03-19 ENCOUNTER — Other Ambulatory Visit: Payer: Self-pay

## 2019-03-19 ENCOUNTER — Ambulatory Visit (HOSPITAL_COMMUNITY)
Admission: RE | Admit: 2019-03-19 | Discharge: 2019-03-19 | Disposition: A | Discharge status: Home / Self Care | Payer: Medicaid Other | Source: Ambulatory Visit | Attending: Student | Admitting: Student

## 2019-03-19 DIAGNOSIS — Z7984 Long term (current) use of oral hypoglycemic drugs: Secondary | ICD-10-CM | POA: Insufficient documentation

## 2019-03-19 DIAGNOSIS — O2341 Unspecified infection of urinary tract in pregnancy, first trimester: Secondary | ICD-10-CM | POA: Diagnosis present

## 2019-03-19 DIAGNOSIS — Z3A08 8 weeks gestation of pregnancy: Secondary | ICD-10-CM | POA: Insufficient documentation

## 2019-03-19 DIAGNOSIS — R7303 Prediabetes: Secondary | ICD-10-CM | POA: Insufficient documentation

## 2019-03-19 DIAGNOSIS — Z79899 Other long term (current) drug therapy: Secondary | ICD-10-CM | POA: Diagnosis not present

## 2019-03-19 DIAGNOSIS — O9989 Other specified diseases and conditions complicating pregnancy, childbirth and the puerperium: Secondary | ICD-10-CM | POA: Insufficient documentation

## 2019-03-19 DIAGNOSIS — O161 Unspecified maternal hypertension, first trimester: Secondary | ICD-10-CM | POA: Diagnosis not present

## 2019-03-19 DIAGNOSIS — O99331 Smoking (tobacco) complicating pregnancy, first trimester: Secondary | ICD-10-CM | POA: Diagnosis not present

## 2019-03-19 DIAGNOSIS — O009 Unspecified ectopic pregnancy without intrauterine pregnancy: Secondary | ICD-10-CM

## 2019-03-19 DIAGNOSIS — O00101 Right tubal pregnancy without intrauterine pregnancy: Secondary | ICD-10-CM | POA: Diagnosis not present

## 2019-03-19 DIAGNOSIS — O34219 Maternal care for unspecified type scar from previous cesarean delivery: Secondary | ICD-10-CM | POA: Diagnosis not present

## 2019-03-19 DIAGNOSIS — O209 Hemorrhage in early pregnancy, unspecified: Secondary | ICD-10-CM | POA: Insufficient documentation

## 2019-03-19 DIAGNOSIS — Z8249 Family history of ischemic heart disease and other diseases of the circulatory system: Secondary | ICD-10-CM | POA: Insufficient documentation

## 2019-03-19 DIAGNOSIS — F17210 Nicotine dependence, cigarettes, uncomplicated: Secondary | ICD-10-CM | POA: Diagnosis not present

## 2019-03-19 LAB — CBC WITH DIFFERENTIAL/PLATELET
Abs Immature Granulocytes: 0.03 10*3/uL (ref 0.00–0.07)
Basophils Absolute: 0 10*3/uL (ref 0.0–0.1)
Basophils Relative: 1 %
Eosinophils Absolute: 0.3 10*3/uL (ref 0.0–0.5)
Eosinophils Relative: 4 %
HCT: 38.7 % (ref 36.0–46.0)
Hemoglobin: 12.8 g/dL (ref 12.0–15.0)
Immature Granulocytes: 0 %
Lymphocytes Relative: 33 %
Lymphs Abs: 2.7 10*3/uL (ref 0.7–4.0)
MCH: 26.6 pg (ref 26.0–34.0)
MCHC: 33.1 g/dL (ref 30.0–36.0)
MCV: 80.5 fL (ref 80.0–100.0)
Monocytes Absolute: 0.6 10*3/uL (ref 0.1–1.0)
Monocytes Relative: 8 %
Neutro Abs: 4.4 10*3/uL (ref 1.7–7.7)
Neutrophils Relative %: 54 %
Platelets: 306 10*3/uL (ref 150–400)
RBC: 4.81 MIL/uL (ref 3.87–5.11)
RDW: 14.3 % (ref 11.5–15.5)
WBC: 8 10*3/uL (ref 4.0–10.5)
nRBC: 0 % (ref 0.0–0.2)

## 2019-03-19 LAB — COMPREHENSIVE METABOLIC PANEL
ALT: 23 U/L (ref 0–44)
AST: 19 U/L (ref 15–41)
Albumin: 3.3 g/dL — ABNORMAL LOW (ref 3.5–5.0)
Alkaline Phosphatase: 65 U/L (ref 38–126)
Anion gap: 7 (ref 5–15)
BUN: 9 mg/dL (ref 6–20)
CO2: 21 mmol/L — ABNORMAL LOW (ref 22–32)
Calcium: 8.9 mg/dL (ref 8.9–10.3)
Chloride: 108 mmol/L (ref 98–111)
Creatinine, Ser: 0.49 mg/dL (ref 0.44–1.00)
GFR calc Af Amer: >60 mL/min (ref >60)
GFR calc non Af Amer: >60 mL/min (ref >60)
Glucose, Bld: 114 mg/dL — ABNORMAL HIGH (ref 70–99)
Potassium: 3.5 mmol/L (ref 3.5–5.1)
Sodium: 136 mmol/L (ref 135–145)
Total Bilirubin: 0.6 mg/dL (ref 0.3–1.2)
Total Protein: 6.2 g/dL — ABNORMAL LOW (ref 6.5–8.1)

## 2019-03-19 LAB — HCG, QUANTITATIVE, PREGNANCY: hCG, Beta Chain, Quant, S: 10184 m[IU]/mL — ABNORMAL HIGH (ref <5)

## 2019-03-19 LAB — TYPE AND SCREEN
ABO/RH(D): B POS
Antibody Screen: NEGATIVE

## 2019-03-19 MED ORDER — METHOTREXATE FOR ECTOPIC PREGNANCY
50.0000 mg/m2 | Freq: Once | INTRAMUSCULAR | Status: AC
  Administered 2019-03-19: 105 mg via INTRAMUSCULAR
  Filled 2019-03-19: qty 1

## 2019-03-19 NOTE — Discharge Instructions (Signed)
Methotrexate Treatment for an Ectopic Pregnancy, Care After This sheet gives you information about how to care for yourself after your procedure. Your health care provider may also give you more specific instructions. If you have problems or questions, contact your health care provider. What can I expect after the procedure? After the procedure, it is common to have:  Abdominal cramping.  Vaginal bleeding.  Fatigue.  Nausea.  Vomiting.  Diarrhea. Blood tests will be taken at timed intervals for several days or weeks to check your pregnancy hormone levels. The blood tests will be done until the pregnancy hormone can no longer be detected in the blood. Follow these instructions at home: Activity  Do not have sex until your health care provider approves.  Limit activities that take a lot of effort as told by your health care provider. Medicines  Take over the counter and prescription medicines only as told by your health care provider.  Do not take aspirin, ibuprofen, naproxen, or any other NSAIDs.  Do not take folic acid, prenatal vitamins, or other vitamins that contain folic acid. General instructions   Do not drink alcohol.  Follow instructions from your health care provider on how and when to report any symptoms that may indicate a ruptured ectopic pregnancy.  Keep all follow-up visits as told by your health care provider. This is important. Contact a health care provider if:  You have persistent nausea and vomiting.  You have persistent diarrhea.  You are having a reaction to the medicine, such as: ? Tiredness. ? Skin rash. ? Hair loss. Get help right away if:  Your abdominal or pelvic pain gets worse.  You have more vaginal bleeding.  You feel light-headed or you faint.  You have shortness of breath.  Your heart rate increases.  You develop a cough.  You have chills.  You have a fever. Summary  After the procedure, it is common to have symptoms  of abdominal cramping, vaginal bleeding and fatigue. You may also experience other symptoms.  Blood tests will be taken at timed intervals for several days or weeks to check your pregnancy hormone levels. The blood tests will be done until the pregnancy hormone can no longer be detected in the blood.  Limit strenuous activity as told by your health care provider.  Follow instructions from your health care provider on how and when to report any symptoms that may indicate a ruptured ectopic pregnancy. This information is not intended to replace advice given to you by your health care provider. Make sure you discuss any questions you have with your health care provider. Document Released: 11/08/2011 Document Revised: 01/08/2017 Document Reviewed: 01/08/2017 Elsevier Interactive Patient Education  2019 [employer].

## 2019-03-19 NOTE — MAU Note (Signed)
Pt sent from U/S for possible ectopic pregnancy. Denies any pain or vag bleeding at this time.

## 2019-03-19 NOTE — MAU Provider Note (Signed)
History     CSN: [id number]  Arrival date and time: 03/19/19 1439   First Provider Initiated Contact with Patient 03/19/19 1456      Chief Complaint  Patient presents with  . Ectopic Pregnancy   HPI Sabrina Larson is a 43 y.o. [id number] at 54w0dwho presents to MAU from ultrasound for treatment after diagnosis of right ectopic pregnancy. Patient denies vaginal bleeding or abdominal pain at this time.  OB History    Gravida  15   Para  6   Term  6   Preterm  0   AB  8   Living  6     SAB  0   TAB  8   Ectopic  0   Multiple  0   Live Births           Obstetric Comments  TSVD x 4. Term c-section x 2.  6 surgical TABs, 2 medicated TABs        Past Medical History:  Diagnosis Date  . GERD (gastroesophageal reflux disease)   . Hypertension   . Prediabetes     Past Surgical History:  Procedure Laterality Date  . CESAREAN SECTION     2014, 1995  . DILATION AND CURETTAGE OF UTERUS     8 total in office and outpatient  . SKIN GRAFT     at age 43   Family History  Problem Relation Age of Onset  . Asthma Mother   . Hypertension Mother   . Asthma Brother     Social History   Tobacco Use  . Smoking status: Current Every Day Smoker    Packs/day: 0.50  . Smokeless tobacco: Never Used  Substance Use Topics  . Alcohol use: Yes    Alcohol/week: 1.0 - 2.0 standard drinks    Types: 1 - 2 Glasses of wine per week    Comment: rarely   . Drug use: No    Allergies: No Known Allergies  Medications Prior to Admission  Medication Sig Dispense Refill Last Dose  . cephALEXin (KEFLEX) 500 MG capsule Take 1 capsule (500 mg total) by mouth 4 (four) times daily. 28 capsule 0   . fluticasone (FLONASE) 50 MCG/ACT nasal spray Place 1-2 sprays into both nostrils daily. 16 g 0   . metFORMIN (GLUCOPHAGE) 500 MG tablet TAKE 1 TABLET(500 MG) BY MOUTH TWICE DAILY WITH A MEAL 60 tablet 5 Taking  . metroNIDAZOLE (FLAGYL) 500 MG tablet Take 1 tablet (500 mg  total) by mouth 2 (two) times daily. 14 tablet 0   . terconazole (TERAZOL 3) 0.8 % vaginal cream Place 1 applicator vaginally at bedtime. 20 g 0     Review of Systems  Constitutional: Negative for chills, fatigue and fever.  Respiratory: Negative for shortness of breath.   Gastrointestinal: Negative for abdominal pain, nausea and vomiting.  Genitourinary: Negative for difficulty urinating, dysuria, vaginal bleeding, vaginal discharge and vaginal pain.  Musculoskeletal: Negative for back pain.  Neurological: Negative for headaches.  All other systems reviewed and are negative.  Physical Exam   Blood pressure 136/84, pulse 88, temperature 98.5 F (36.9 C), resp. rate 18, height 5' 6" (1.676 m), weight 93.9 kg, last menstrual period 01/22/2019.  Physical Exam  Nursing note and vitals reviewed. Constitutional: She is oriented to person, place, and time. She appears well-developed and well-nourished.  Cardiovascular: Normal rate.  Respiratory: Effort normal.  GI: Soft. She exhibits no distension. There is no abdominal tenderness. There is no  rebound and no guarding.  Genitourinary:    Genitourinary Comments: Not evaluated   Neurological: She is alert and oriented to person, place, and time.  Skin: Skin is warm and dry.  Psychiatric: She has a normal mood and affect. Her behavior is normal. Judgment and thought content normal.    MAU Course/MDM  Procedures  S/p consult with Dr Ervin to confirm eligibility for MTX  Patient Vitals for the past 24 hrs:  BP Temp Pulse Resp Height Weight  03/19/19 1447 136/84 98.5 F (36.9 C) 88 18 5' 6" (1.676 m) 93.9 kg    Results for orders placed or performed during the hospital encounter of 03/19/19 (from the past 24 hour(s))  hCG, quantitative, pregnancy     Status: Abnormal   Collection Time: 03/19/19  3:17 PM  Result Value Ref Range   hCG, Beta Chain, Quant, S 10,184 (H) <5 mIU/mL  CBC WITH DIFFERENTIAL     Status: None   Collection Time:  03/19/19  3:17 PM  Result Value Ref Range   WBC 8.0 4.0 - 10.5 K/uL   RBC 4.81 3.87 - 5.11 MIL/uL   Hemoglobin 12.8 12.0 - 15.0 g/dL   HCT 38.7 36.0 - 46.0 %   MCV 80.5 80.0 - 100.0 fL   MCH 26.6 26.0 - 34.0 pg   MCHC 33.1 30.0 - 36.0 g/dL   RDW 14.3 11.5 - 15.5 %   Platelets 306 150 - 400 K/uL   nRBC 0.0 0.0 - 0.2 %   Neutrophils Relative % 54 %   Neutro Abs 4.4 1.7 - 7.7 K/uL   Lymphocytes Relative 33 %   Lymphs Abs 2.7 0.7 - 4.0 K/uL   Monocytes Relative 8 %   Monocytes Absolute 0.6 0.1 - 1.0 K/uL   Eosinophils Relative 4 %   Eosinophils Absolute 0.3 0.0 - 0.5 K/uL   Basophils Relative 1 %   Basophils Absolute 0.0 0.0 - 0.1 K/uL   Immature Granulocytes 0 %   Abs Immature Granulocytes 0.03 0.00 - 0.07 K/uL  Comprehensive metabolic panel     Status: Abnormal   Collection Time: 03/19/19  3:17 PM  Result Value Ref Range   Sodium 136 135 - 145 mmol/L   Potassium 3.5 3.5 - 5.1 mmol/L   Chloride 108 98 - 111 mmol/L   CO2 21 (L) 22 - 32 mmol/L   Glucose, Bld 114 (H) 70 - 99 mg/dL   BUN 9 6 - 20 mg/dL   Creatinine, Ser 0.49 0.44 - 1.00 mg/dL   Calcium 8.9 8.9 - 10.3 mg/dL   Total Protein 6.2 (L) 6.5 - 8.1 g/dL   Albumin 3.3 (L) 3.5 - 5.0 g/dL   AST 19 15 - 41 U/L   ALT 23 0 - 44 U/L   Alkaline Phosphatase 65 38 - 126 U/L   Total Bilirubin 0.6 0.3 - 1.2 mg/dL   GFR calc non Af Amer >60 >60 mL/min   GFR calc Af Amer >60 >60 mL/min   Anion gap 7 5 - 15  Type and screen [facility]     Status: None   Collection Time: 03/19/19  3:17 PM  Result Value Ref Range   ABO/RH(D) B POS    Antibody Screen NEG    Sample Expiration      03/22/2019 Performed at [facility], [address] [place], [place] [address]     Us Ob Transvaginal  Result Date: 03/19/2019 CLINICAL DATA:  Vaginal bleeding. 1st   trimester pregnancy of unknown anatomic location. EXAM: TRANSVAGINAL OB ULTRASOUND TECHNIQUE: Transvaginal ultrasound was performed for complete evaluation of the  gestation as well as the maternal uterus, adnexal regions, and pelvic cul-de-sac. COMPARISON:  03/05/2019 FINDINGS: Intrauterine gestational sac: None Maternal uterus/adnexae: Thickened endometrium noted, but no intrauterine gestational sac visualized. A 1.8 cm partially calcified fibroid is seen in the anterior corpus. Both ovaries are normal in appearance. An ectopic gestational sac is seen in the right adnexa adjacent to the right ovary, which measures 2.7 x 1.6 by 1.7 cm. This has a mean sac diameter of 1.2 cm, corresponding with a gestational age of [redacted] weeks 0 days. Tiny amount of simple free fluid noted in the pelvic cul-de-sac, however there is no evidence of hemoperitoneum. IMPRESSION: Ectopic gestational sac in the right adnexa, measuring 2.7 cm in maximum diameter, consistent with ectopic pregnancy. No evidence of hemoperitoneum. 1.8 cm anterior uterine fibroid incidentally noted. Critical Value/emergent results were called by telephone at the time of interpretation on 03/19/2019 at 2:05 pm to Dr. Anyanwu, who verbally acknowledged these results. Electronically Signed   By: John  Stahl M.D.   On: 03/19/2019 14:13    Methotrexate Treatment Protocol for Ectopic Pregnancy  Pretreatment testing and instructions  hCG concentration  Transvaginal ultrasound  Blood group and Rh(D) typing; give Rhogam 300 mcg IM, if indicated  Complete blood count  Liver and renal function tests  Discontinue folic acid supplements  Counsel patient to avoid NSAIDs, recommend acetaminophen if an analgesic is needed  Advise patient to refrain from sexual intercourse and strenuous exercise  Treatment day  Single dose protocol   1 Today 04/16 hCG.  Administer Methotrexate 50 mg/m2 body surface area IM  4  Sun  04/19 hCG   Meds ordered this encounter  Medications  . methotrexate (EMGYN) chemo injection kit 105 mg   Assessment and Plan  --42 y.o. [id number] at [redacted]w[redacted]d  --Right ectopic pregnancy s/p Methotrexate  today --Discharge home in stable condition with bleeding/ectopic precautions  F/U: Return to MAU Sunday after 4pm for repeat Quant hCG  Samantha C Weinhold, CNM 03/19/2019, 5:20 PM  

## 2019-03-23 ENCOUNTER — Other Ambulatory Visit: Payer: Self-pay

## 2019-03-23 ENCOUNTER — Inpatient Hospital Stay (HOSPITAL_COMMUNITY)
Admission: AD | Admit: 2019-03-23 | Discharge: 2019-03-23 | Disposition: A | Discharge status: Home / Self Care | Payer: Medicaid Other | Attending: Obstetrics and Gynecology | Admitting: Obstetrics and Gynecology

## 2019-03-23 DIAGNOSIS — O9989 Other specified diseases and conditions complicating pregnancy, childbirth and the puerperium: Secondary | ICD-10-CM | POA: Insufficient documentation

## 2019-03-23 DIAGNOSIS — Z3A08 8 weeks gestation of pregnancy: Secondary | ICD-10-CM | POA: Insufficient documentation

## 2019-03-23 DIAGNOSIS — O00201 Right ovarian pregnancy without intrauterine pregnancy: Secondary | ICD-10-CM | POA: Insufficient documentation

## 2019-03-23 LAB — HCG, QUANTITATIVE, PREGNANCY: hCG, Beta Chain, Quant, S: 9336 m[IU]/mL — ABNORMAL HIGH (ref <5)

## 2019-03-23 NOTE — Discharge Instructions (Signed)
Methotrexate Treatment for an Ectopic Pregnancy  Methotrexate is a medicine that treats an ectopic pregnancy. An ectopic pregnancy is a pregnancy in which the fetus develops outside the uterus. This kind of pregnancy can be dangerous. Methotrexate works by stopping the growth of the fertilized egg. It also helps your body absorb tissue from the egg. This takes between 2-6 weeks. Most ectopic pregnancies can be successfully treated with methotrexate if they are diagnosed early. Tell a health care provider about:  Any allergies you have.  All medicines you are taking, including vitamins, herbs, eye drops, creams, and over-the-counter medicines.  Any medical conditions you have. What are the risks? Generally, this is a safe treatment. However, problems may occur, including:  Nausea or vomiting or both.  Vaginal bleeding or spotting.  Diarrhea.  Abdominal cramping.  Dizziness or feeling lightheaded.  Mouth sores.  Swelling or irritation of the lining of your lungs (pneumonitis).  Liver damage.  Hair loss. There is a risk that methotrexate treatment will fail and your pregnancy will continue. There is also a risk that the ectopic pregnancy might rupture while you are using this medicine. What happens before the procedure?  Liver tests, kidney tests, and a complete blood test will be done.  Blood tests will be done to measure the pregnancy hormone levels and to determine your blood type.  If you are Rh-negative and the father is Rh-positive or his Rh type is not known, you will be given a Rho (D) immune globulin shot. What happens during the procedure? Your health care provider may give you methotrexate by injection or in the form of a pill. Methotrexate may be given as a single dose of medicine or a series of doses, depending on your response to the treatment.  Methotrexate injections will be given by your health care provider. This is the most common way that methotrexate is used  to treat an ectopic pregnancy.  If you are prescribed oral methotrexate, it is very important that you follow your health care provider's instructions on how to take oral methotrexate. Additional medicines may be needed to manage an ectopic pregnancy. The procedure may vary among health care providers and hospitals. What happens after the procedure?  You may have abdominal cramping, vaginal bleeding, and fatigue.  Blood tests will be taken at timed intervals for several days or weeks to check your pregnancy hormone levels. The blood tests will be done until the pregnancy hormone can no longer be detected in the blood.  You may need to have a surgical procedure to remove the ectopic pregnancy if methotrexate treatment fails.  Follow instructions from your health care provider on how and when to report any symptoms that may indicate a ruptured ectopic pregnancy. Summary  Methotrexate is a medicine that treats an ectopic pregnancy.  Methotrexate may be given in a single dose or a series of doses over time.  Blood tests will be taken at timed intervals for several days or weeks to check your pregnancy hormone levels. The blood tests will be done until no more pregnancy hormone is detected in the blood.  There is a risk that methotrexate treatment will fail and your pregnancy will continue. There is also a risk that the ectopic pregnancy might rupture while you are using this medicine. This information is not intended to replace advice given to you by your health care provider. Make sure you discuss any questions you have with your health care provider. Document Released: 11/13/2001 Document Revised: 01/08/2017 Document Reviewed:  01/08/2017 Elsevier Interactive Patient Education  2019 [employer].   Ectopic Pregnancy  An ectopic pregnancy is when the fertilized egg attaches (implants) outside the uterus. Most ectopic pregnancies occur in one of the tubes where eggs travel from the ovary to  the uterus (fallopian tubes), but the implanting can occur in other locations. In rare cases, ectopic pregnancies occur on the ovary, intestine, pelvis, abdomen, or cervix. In an ectopic pregnancy, the fertilized egg does not have the ability to develop into a normal, healthy baby. A ruptured ectopic pregnancy is one in which tearing or bursting of a fallopian tube causes internal bleeding. Often, there is intense lower abdominal pain, and vaginal bleeding sometimes occurs. Having an ectopic pregnancy can be life-threatening. If this dangerous condition is not treated, it can lead to blood loss, shock, or even death. What are the causes? The most common cause of this condition is damage to one of the fallopian tubes. A fallopian tube may be narrowed or blocked, and that keeps the fertilized egg from reaching the uterus. What increases the risk? This condition is more likely to develop in women of childbearing age who have different levels of risk. The levels of risk can be divided into three categories. High risk  You have gone through infertility treatment.  You have had an ectopic pregnancy before.  You have had surgery on the fallopian tubes, or another surgical procedure, such as an abortion.  You have had surgery to have the fallopian tubes tied (tubal ligation).  You have problems or diseases of the fallopian tubes.  You have been exposed to diethylstilbestrol (DES). This medicine was used until 1971, and it had effects on babies whose mothers took the medicine.  You become pregnant while using an IUD (intrauterine device) for birth control. Moderate risk  You have a history of infertility.  You have had an STI (sexually transmitted infection).  You have a history of pelvic inflammatory disease (PID).  You have scarring from endometriosis.  You have multiple sexual partners.  You smoke. Low risk  You have had pelvic surgery.  You use vaginal douches.  You became sexually  active before age 52. What are the signs or symptoms? Common symptoms of this condition include normal pregnancy symptoms, such as missing a period, nausea, tiredness, abdominal pain, breast tenderness, and bleeding. However, ectopic pregnancy will have additional symptoms, such as:  Pain with intercourse.  Irregular vaginal bleeding or spotting.  Cramping or pain on one side or in the lower abdomen.  Fast heartbeat, low blood pressure, and sweating.  Passing out while having a bowel movement. Symptoms of a ruptured ectopic pregnancy and internal bleeding may include:  Sudden, severe pain in the abdomen and pelvis.  Dizziness, weakness, light-headedness, or fainting.  Pain in the shoulder or neck area. How is this diagnosed? This condition is diagnosed by:  A pelvic exam to locate pain or a mass in the abdomen.  A pregnancy test. This blood test checks for the presence as well as the specific level of pregnancy hormone in the bloodstream.  Ultrasound. This is performed if a pregnancy test is positive. In this test, a probe is inserted into the vagina. The probe will detect a fetus, possibly in a location other than the uterus.  Taking a sample of uterus tissue (dilation and curettage, or D&C).  Surgery to perform a visual exam of the inside of the abdomen using a thin, lighted tube that has a tiny camera on the end (laparoscope).  Culdocentesis. This procedure involves inserting a needle at the top of the vagina, behind the uterus. If blood is present in this area, it may indicate that a fallopian tube is torn. How is this treated? This condition is treated with medicine or surgery. Medicine  An injection of a medicine (methotrexate) may be given to cause the pregnancy tissue to be absorbed. This medicine may save your fallopian tube. It may be given if: ? The diagnosis is made early, with no signs of active bleeding. ? The fallopian tube has not ruptured. ? You are  considered to be a good candidate for the medicine. Usually, pregnancy hormone blood levels are checked after methotrexate treatment. This is to be sure that the medicine is effective. It may take 4-6 weeks for the pregnancy to be absorbed. Most pregnancies will be absorbed by 3 weeks. Surgery  A laparoscope may be used to remove the pregnancy tissue.  If severe internal bleeding occurs, a larger cut (incision) may be made in the lower abdomen (laparotomy) to remove the fetus and placenta. This is done to stop the bleeding.  Part or all of the fallopian tube may be removed (salpingectomy) along with the fetus and placenta. The fallopian tube may also be repaired during the surgery.  In very rare circumstances, removal of the uterus (hysterectomy) may be required.  After surgery, pregnancy hormone testing may be done to be sure that there is no pregnancy tissue left. Whether your treatment is medicine or surgery, you may receive a Rho (D) immune globulin shot to prevent problems with any future pregnancy. This shot may be given if:  You are Rh-negative and the baby's father is Rh-positive.  You are Rh-negative and you do not know the Rh type of the baby's father. Follow these instructions at home:  Rest and limit your activity after the procedure for as long as told by your health care provider.  Until your health care provider says that it is safe: ? Do not lift anything that is heavier than 10 lb (4.5 kg), or the limit that your health care provider tells you. ? Avoid physical exercise and any movement that requires effort (is strenuous).  To help prevent constipation: ? Eat a healthy diet that includes fruits, vegetables, and whole grains. ? Drink 6-8 glasses of water per day. Get help right away if:  You develop worsening pain that is not relieved by medicine.  You have: ? A fever or chills. ? Vaginal bleeding. ? Redness and swelling at the incision site. ? Nausea and  vomiting.  You feel dizzy or weak.  You feel light-headed or you faint. This information is not intended to replace advice given to you by your health care provider. Make sure you discuss any questions you have with your health care provider. Document Released: 12/27/2004 Document Revised: 07/18/2016 Document Reviewed: 06/20/2016 Elsevier Interactive Patient Education  2019 [employer] Inc[employer]   Ruptured Ectopic Pregnancy  An ectopic pregnancy is when a fertilized egg attaches (implants) outside of the uterus, usually in a fallopian tube. A ruptured ectopic pregnancy is when the fallopian tube tears or bursts. This results in internal bleeding, intense abdominal pain, and sometimes, vaginal bleeding. Most ectopic pregnancies occur in the fallopian tube. In rare cases, it may occur on the ovary, intestine, pelvis, or cervix. An ectopic pregnancy does not have the ability to develop into a normal, healthy baby. A ruptured ectopic pregnancy can affect your ability to have children (fertility), depending on damage it causes  to your reproductive organs. Ruptured ectopic pregnancy is a medical emergency. If not treated immediately, it can lead to blood loss, shock, or even death. What are the causes? Most ectopic pregnancies are caused by damage to the fallopian tubes. The damage prevents the fertilized egg from implanting in the uterus. In some cases, the cause may not be known. What increases the risk? You are at increased risk for an ectopic pregnancy if:  You have had a previous ectopic pregnancy.  You have had previous fallopian tube surgery.  You have had previous surgery to have the fallopian tubes tied (tubal ligation).  You have had infertility treatments or have a history of infertility.  You have been exposed to DES. DES is a medicine that was used until 1971 and had effects on babies whose mothers took the medicine.  You use an IUD (intrauterine device) for birth control.  You use  progestin-only oral contraception for birth control.  You have a history of pelvic inflammatory disease (PID).  You have a history of endometriosis.  You smoke.  You became sexually active before 43 years of age.  You have multiple sexual partners. What are the signs or symptoms? Symptoms of a ruptured ectopic pregnancy and internal bleeding may include:  Sudden, severe pain in the abdomen and pelvis.  Dizziness or fainting.  Pain in the shoulder area.  Vaginal bleeding. How is this diagnosed? This condition is diagnosed based on your medical history, symptoms, a physical exam, and tests, which may include:  A pregnancy test.  An ultrasound.  Measuring the levels of the pregnancy hormone in the bloodstream.  Taking a sample of tissue from the uterus (dilation and curettage, D&C).  Surgery to visually examine the inside of the abdomen using a lighted tube (laparoscopy). How is this treated? This condition is treated with IV fluids and emergency surgery to remove the ectopic pregnancy and repair the area where the rupture occured. If you have lost a lot of blood, you may need a blood transfusion. If you are Rh negative and your baby's father is Rh positive, or the Rh type of the father is unknown, you may receive a Rho (D) immune globulin shot. This is to prevent Rh problems in future pregnancies. Additional medicines may be given. Get help right away if:  You are taking medicines to treat an ectopic pregnancy and you develop symptoms of a rupture. These include: ? Fever or chills. ? Shoulder pain. ? Vaginal bleeding. ? Nausea and vomiting. ? Severe abdominal pain or cramping. ? Feeling light-headed or fainting. Summary  An ectopic pregnancy is when a fertilized egg attaches (implants) outside of the uterus, usually in a fallopian tube. A ruptured ectopic pregnancy is when the fallopian tube tears or bursts.  Ruptured ectopic pregnancy is a medical emergency. If not  treated immediately, it can lead to blood loss, shock, or even death.  This condition is treated with IV fluids and emergency surgery to remove the ectopic pregnancy and repair the area where the rupture occured. If you have lost a lot of blood, you may need a blood transfusion. This information is not intended to replace advice given to you by your health care provider. Make sure you discuss any questions you have with your health care provider. Document Released: 11/16/2000 Document Revised: 02/06/2017 Document Reviewed: 02/06/2017 Elsevier Interactive Patient Education  2019 [place].   Methotrexate Treatment for an Ectopic Pregnancy, Care After This sheet gives you information about how to care for yourself  after your procedure. Your health care provider may also give you more specific instructions. If you have problems or questions, contact your health care provider. What can I expect after the procedure? After the procedure, it is common to have:  Abdominal cramping.  Vaginal bleeding.  Fatigue.  Nausea.  Vomiting.  Diarrhea. Blood tests will be taken at timed intervals for several days or weeks to check your pregnancy hormone levels. The blood tests will be done until the pregnancy hormone can no longer be detected in the blood. Follow these instructions at home: Activity  Do not have sex until your health care provider approves.  Limit activities that take a lot of effort as told by your health care provider. Medicines  Take over the counter and prescription medicines only as told by your health care provider.  Do not take aspirin, ibuprofen, naproxen, or any other NSAIDs.  Do not take folic acid, prenatal vitamins, or other vitamins that contain folic acid. General instructions   Do not drink alcohol.  Follow instructions from your health care provider on how and when to report any symptoms that may indicate a ruptured ectopic pregnancy.  Keep all follow-up  visits as told by your health care provider. This is important. Contact a health care provider if:  You have persistent nausea and vomiting.  You have persistent diarrhea.  You are having a reaction to the medicine, such as: ? Tiredness. ? Skin rash. ? Hair loss. Get help right away if:  Your abdominal or pelvic pain gets worse.  You have more vaginal bleeding.  You feel light-headed or you faint.  You have shortness of breath.  Your heart rate increases.  You develop a cough.  You have chills.  You have a fever. Summary  After the procedure, it is common to have symptoms of abdominal cramping, vaginal bleeding and fatigue. You may also experience other symptoms.  Blood tests will be taken at timed intervals for several days or weeks to check your pregnancy hormone levels. The blood tests will be done until the pregnancy hormone can no longer be detected in the blood.  Limit strenuous activity as told by your health care provider.  Follow instructions from your health care provider on how and when to report any symptoms that may indicate a ruptured ectopic pregnancy. This information is not intended to replace advice given to you by your health care provider. Make sure you discuss any questions you have with your health care provider. Document Released: 11/08/2011 Document Revised: 01/08/2017 Document Reviewed: 01/08/2017 Elsevier Interactive Patient Education  2019 [employer].

## 2019-03-23 NOTE — MAU Provider Note (Signed)
Subjective:  Sabrina Larson is a 43 y.o. [id number] at [redacted]w[redacted]d who presents today for FU BHCG. She was seen on 03/19/2019 and dx with a right adnexal ectopic pregnancy and given methotrexate. Results from that day show HCG 10,184. She denies vaginal bleeding. She denies abdominal or pelvic pain. Pt reports she feels normal and well.  Of note, pt reports she was instructed to return to MAU for repeat hCG yesterday, but she did not present yesterday for repeat hCG and instead presented to MAU today.   Objective:  Physical Exam  Nursing note and vitals reviewed.  Patient Vitals for the past 24 hrs:  BP Temp Temp src Pulse Resp SpO2  03/23/19 1703 138/83 98.9 F (37.2 C) Oral 93 18 100 %   Constitutional: She is oriented to person, place, and time. She appears well-developed and well-nourished. No distress.  HENT:  Head: Normocephalic.  Cardiovascular: Normal rate.  Respiratory: Effort normal.  GI: Soft. There is no tenderness.  Neurological: She is alert and oriented to person, place, and time. Skin: Skin is warm and dry.  Psychiatric: She has a normal mood and affect.   Results for orders placed or performed during the hospital encounter of 03/23/19 (from the past 24 hour(s))  hCG, quantitative, pregnancy     Status: Abnormal   Collection Time: 03/23/19  4:59 PM  Result Value Ref Range   hCG, Beta Chain, Quant, S 9,336 (H) <5 mIU/mL   Assessment/Plan: Right ectopic pregnancy, post-methotrexate, Day 5 (pt did not return to MAU on Day 4 as instructed) Discussed with pt importance of keeping appt on Wed for Day 7 hCG as decisions will be made regarding repeat methotrexate/surgery on this day. Appt scheduled at [facility] for 03/25/2019 @8 :30am, pt aware HCG today 9,336 FU 03/25/2019 Strict ectopic precautions given Pt discharged to home in stable condition  Willisha Sligar, Gerrie Nordmann, NP  6:28 PM 03/23/2019

## 2019-03-23 NOTE — MAU Note (Signed)
Doing ok.  Has had cramping a couple times, no real pain, no bleeding.

## 2019-03-24 ENCOUNTER — Telehealth: Payer: Self-pay | Admitting: Family Medicine

## 2019-03-24 NOTE — Telephone Encounter (Signed)
Attempted to call patient no let her know our new address and procedure changes. No answer, left message with the above information.

## 2019-03-25 ENCOUNTER — Encounter: Payer: Self-pay | Admitting: Obstetrics & Gynecology

## 2019-03-25 ENCOUNTER — Ambulatory Visit: Payer: Medicaid Other

## 2019-03-25 ENCOUNTER — Other Ambulatory Visit: Payer: Self-pay

## 2019-03-25 DIAGNOSIS — O00101 Right tubal pregnancy without intrauterine pregnancy: Secondary | ICD-10-CM

## 2019-03-25 LAB — BETA HCG QUANT (REF LAB): hCG Quant: 6677 m[IU]/mL

## 2019-03-25 NOTE — Progress Notes (Signed)
Pt here today for STAT Beta Lab s/p day 7 MTX tx.  Pt denies any pain or bleeding.  Pt informed that we will call her with results.  Pt stated understanding with no further questions.   Received pt critical value beta lab from [facility] resulted 6,677.  Notified Dr. Ilda Basset pt results.  Provider recommendation to have pt f/u weekly for beta results.    Called pt and notified her results and provider recommendation. Pt stated that she will be able to come in on 04/01/19 @ 0900 for non-stat beta.  [place] office notified.

## 2019-03-30 ENCOUNTER — Encounter (HOSPITAL_COMMUNITY): Payer: Self-pay

## 2019-03-30 ENCOUNTER — Inpatient Hospital Stay (HOSPITAL_COMMUNITY)
Admission: AD | Admit: 2019-03-30 | Discharge: 2019-03-30 | Disposition: A | Discharge status: Home / Self Care | Payer: Medicaid Other | Attending: Obstetrics and Gynecology | Admitting: Obstetrics and Gynecology

## 2019-03-30 ENCOUNTER — Other Ambulatory Visit: Payer: Self-pay

## 2019-03-30 DIAGNOSIS — F17210 Nicotine dependence, cigarettes, uncomplicated: Secondary | ICD-10-CM | POA: Diagnosis not present

## 2019-03-30 DIAGNOSIS — O26891 Other specified pregnancy related conditions, first trimester: Secondary | ICD-10-CM | POA: Insufficient documentation

## 2019-03-30 DIAGNOSIS — O00101 Right tubal pregnancy without intrauterine pregnancy: Secondary | ICD-10-CM | POA: Diagnosis not present

## 2019-03-30 DIAGNOSIS — O26851 Spotting complicating pregnancy, first trimester: Secondary | ICD-10-CM | POA: Diagnosis not present

## 2019-03-30 DIAGNOSIS — O99331 Smoking (tobacco) complicating pregnancy, first trimester: Secondary | ICD-10-CM | POA: Insufficient documentation

## 2019-03-30 DIAGNOSIS — Z79899 Other long term (current) drug therapy: Secondary | ICD-10-CM | POA: Diagnosis not present

## 2019-03-30 DIAGNOSIS — Z3A09 9 weeks gestation of pregnancy: Secondary | ICD-10-CM | POA: Diagnosis not present

## 2019-03-30 DIAGNOSIS — R109 Unspecified abdominal pain: Secondary | ICD-10-CM | POA: Insufficient documentation

## 2019-03-30 DIAGNOSIS — R531 Weakness: Secondary | ICD-10-CM | POA: Insufficient documentation

## 2019-03-30 LAB — HCG, QUANTITATIVE, PREGNANCY: hCG, Beta Chain, Quant, S: 4419 m[IU]/mL — ABNORMAL HIGH (ref <5)

## 2019-03-30 LAB — URINALYSIS, ROUTINE W REFLEX MICROSCOPIC
Bilirubin Urine: NEGATIVE
Glucose, UA: NEGATIVE mg/dL
Ketones, ur: NEGATIVE mg/dL
Nitrite: NEGATIVE
Protein, ur: 100 mg/dL — AB
RBC / HPF: >50 RBC/hpf — ABNORMAL HIGH (ref 0–5)
Specific Gravity, Urine: 1.013 (ref 1.005–1.030)
pH: 6 (ref 5.0–8.0)

## 2019-03-30 LAB — CBC
HCT: 35.4 % — ABNORMAL LOW (ref 36.0–46.0)
Hemoglobin: 11.8 g/dL — ABNORMAL LOW (ref 12.0–15.0)
MCH: 27.4 pg (ref 26.0–34.0)
MCHC: 33.3 g/dL (ref 30.0–36.0)
MCV: 82.1 fL (ref 80.0–100.0)
Platelets: 261 10*3/uL (ref 150–400)
RBC: 4.31 MIL/uL (ref 3.87–5.11)
RDW: 14.5 % (ref 11.5–15.5)
WBC: 10.8 10*3/uL — ABNORMAL HIGH (ref 4.0–10.5)
nRBC: 0 % (ref 0.0–0.2)

## 2019-03-30 NOTE — MAU Note (Addendum)
Pt reports to MAU post MTX injection complaining of bleeding and abdominal cramping. Pt states she is using a super pad every hour and she has passed a blood clot the size of a plumb today. Pt reports the pain is a 8/10 on the pain scale and only gets slightly better with Motrin. Pt denies feeling dizzy or lightheaded but states she just feels weak. Pt denies any other symptoms.   BP 141/89 Pulse: 89  Temp 98.6 Pain 8/10 R: 17 Sp02 99

## 2019-03-30 NOTE — Discharge Instructions (Signed)
Methotrexate Treatment for an Ectopic Pregnancy  Methotrexate is a medicine that treats an ectopic pregnancy. An ectopic pregnancy is a pregnancy in which the fetus develops outside the uterus. This kind of pregnancy can be dangerous. Methotrexate works by stopping the growth of the fertilized egg. It also helps your body absorb tissue from the egg. This takes between 2-6 weeks. Most ectopic pregnancies can be successfully treated with methotrexate if they are diagnosed early. Tell a health care provider about:  Any allergies you have.  All medicines you are taking, including vitamins, herbs, eye drops, creams, and over-the-counter medicines.  Any medical conditions you have. What are the risks? Generally, this is a safe treatment. However, problems may occur, including:  Nausea or vomiting or both.  Vaginal bleeding or spotting.  Diarrhea.  Abdominal cramping.  Dizziness or feeling lightheaded.  Mouth sores.  Swelling or irritation of the lining of your lungs (pneumonitis).  Liver damage.  Hair loss. There is a risk that methotrexate treatment will fail and your pregnancy will continue. There is also a risk that the ectopic pregnancy might rupture while you are using this medicine. What happens before the procedure?  Liver tests, kidney tests, and a complete blood test will be done.  Blood tests will be done to measure the pregnancy hormone levels and to determine your blood type.  If you are Rh-negative and the father is Rh-positive or his Rh type is not known, you will be given a Rho (D) immune globulin shot. What happens during the procedure? Your health care provider may give you methotrexate by injection or in the form of a pill. Methotrexate may be given as a single dose of medicine or a series of doses, depending on your response to the treatment.  Methotrexate injections will be given by your health care provider. This is the most common way that methotrexate is used  to treat an ectopic pregnancy.  If you are prescribed oral methotrexate, it is very important that you follow your health care provider's instructions on how to take oral methotrexate. Additional medicines may be needed to manage an ectopic pregnancy. The procedure may vary among health care providers and hospitals. What happens after the procedure?  You may have abdominal cramping, vaginal bleeding, and fatigue.  Blood tests will be taken at timed intervals for several days or weeks to check your pregnancy hormone levels. The blood tests will be done until the pregnancy hormone can no longer be detected in the blood.  You may need to have a surgical procedure to remove the ectopic pregnancy if methotrexate treatment fails.  Follow instructions from your health care provider on how and when to report any symptoms that may indicate a ruptured ectopic pregnancy. Summary  Methotrexate is a medicine that treats an ectopic pregnancy.  Methotrexate may be given in a single dose or a series of doses over time.  Blood tests will be taken at timed intervals for several days or weeks to check your pregnancy hormone levels. The blood tests will be done until no more pregnancy hormone is detected in the blood.  There is a risk that methotrexate treatment will fail and your pregnancy will continue. There is also a risk that the ectopic pregnancy might rupture while you are using this medicine. This information is not intended to replace advice given to you by your health care provider. Make sure you discuss any questions you have with your health care provider. Document Released: 11/13/2001 Document Revised: 01/08/2017 Document Reviewed:  01/08/2017 Elsevier Interactive Patient Education  [employer].

## 2019-03-30 NOTE — MAU Provider Note (Addendum)
History     CSN: [id number]  Arrival date and time: 03/30/19 5093   First Provider Initiated Contact with Patient 03/30/19 2035      Chief Complaint  Patient presents with  . Abdominal Pain  . Vaginal Bleeding   HPI  Ms.  Sabrina Larson is a 43 y.o. year old [id number] female at [redacted]w[redacted]d weeks gestation who is 11 days s/p MTX (on 03/19/2019) presents to MAU reporting abdominal pain and VB since yesterday, but "worse today." She reports soaking through a super pad/hour and passed a plum-sized clot today. She reports the VB has slowed down and she is "only passing small clots now." She rates the pain a 8/10, but is better with Motrin 1200 mg about 1830 this evening. She also complains feeling weak.  *Her HCG levels are: 646 (4/5), 10,184 (4/16), 9,336 (4/20), 6,677 (4/22)   Past Medical History:  Diagnosis Date  . GERD (gastroesophageal reflux disease)   . Hypertension   . Prediabetes     Past Surgical History:  Procedure Laterality Date  . CESAREAN SECTION     2014, 1995  . DILATION AND CURETTAGE OF UTERUS     8 total in office and outpatient  . SKIN GRAFT     at age 70    Family History  Problem Relation Age of Onset  . Asthma Mother   . Hypertension Mother   . Asthma Brother     Social History   Tobacco Use  . Smoking status: Current Every Day Smoker    Packs/day: 0.50  . Smokeless tobacco: Never Used  Substance Use Topics  . Alcohol use: Yes    Alcohol/week: 1.0 - 2.0 standard drinks    Types: 1 - 2 Glasses of wine per week    Comment: rarely   . Drug use: No    Allergies: No Known Allergies  Medications Prior to Admission  Medication Sig Dispense Refill Last Dose  . cephALEXin (KEFLEX) 500 MG capsule Take 1 capsule (500 mg total) by mouth 4 (four) times daily. 28 capsule 0     Review of Systems  Constitutional: Negative.   HENT: Negative.   Eyes: Negative.   Respiratory: Negative.   Cardiovascular: Negative.   Gastrointestinal: Positive  for abdominal pain.  Endocrine: Negative.   Genitourinary: Positive for pelvic pain and vaginal bleeding (passed a plum-sized clot today).  Musculoskeletal: Negative.   Skin: Negative.   Allergic/Immunologic: Negative.   Neurological: Negative.   Psychiatric/Behavioral: Negative.    Physical Exam   Blood pressure (!) 139/92, pulse 91, temperature 98.4 F (36.9 C), temperature source Oral, resp. rate 18, last menstrual period 01/22/2019, SpO2 99 %.  Physical Exam  Nursing note and vitals reviewed. Constitutional: She is oriented to person, place, and time. She appears well-developed and well-nourished.  HENT:  Head: Normocephalic and atraumatic.  Eyes: Pupils are equal, round, and reactive to light.  Neck: Normal range of motion.  Cardiovascular: Normal rate.  Respiratory: Effort normal.  GI: Soft.  Genitourinary:    Genitourinary Comments: Small amount of dark, red blood in vaginal vault -- cleared out with 3-4 large cotton swabs  cervix prolapsed to introitus with removal of speculum   Musculoskeletal: Normal range of motion.  Neurological: She is alert and oriented to person, place, and time.  Skin: Skin is warm.  Psychiatric: She has a normal mood and affect. Her behavior is normal. Judgment and thought content normal.    MAU Course  Procedures  MDM Speculum Exam  CBC Orthostatic VS HCG  *Consult with Dr. Rosana Hoes @ 2150 - notified of patient's complaints, assessments, & lab results   Report given to and care assumed by Jorje Guild, FNP  Laury Deep MSN, CNM 03/30/2019, 9:50 PM  Results for orders placed or performed during the hospital encounter of 03/30/19 (from the past 24 hour(s))  Urinalysis, Routine w reflex microscopic     Status: Abnormal   Collection Time: 03/30/19  8:18 PM  Result Value Ref Range   Color, Urine AMBER (A) YELLOW   APPearance HAZY (A) CLEAR   Specific Gravity, Urine 1.013 1.005 - 1.030   pH 6.0 5.0 - 8.0   Glucose, UA NEGATIVE  NEGATIVE mg/dL   Hgb urine dipstick LARGE (A) NEGATIVE   Bilirubin Urine NEGATIVE NEGATIVE   Ketones, ur NEGATIVE NEGATIVE mg/dL   Protein, ur 100 (A) NEGATIVE mg/dL   Nitrite NEGATIVE NEGATIVE   Leukocytes,Ua MODERATE (A) NEGATIVE   RBC / HPF >50 (H) 0 - 5 RBC/hpf   WBC, UA 11-20 0 - 5 WBC/hpf   Bacteria, UA RARE (A) NONE SEEN   Squamous Epithelial / LPF 0-5 0 - 5  CBC     Status: Abnormal   Collection Time: 03/30/19  9:05 PM  Result Value Ref Range   WBC 10.8 (H) 4.0 - 10.5 K/uL   RBC 4.31 3.87 - 5.11 MIL/uL   Hemoglobin 11.8 (L) 12.0 - 15.0 g/dL   HCT 35.4 (L) 36.0 - 46.0 %   MCV 82.1 80.0 - 100.0 fL   MCH 27.4 26.0 - 34.0 pg   MCHC 33.3 30.0 - 36.0 g/dL   RDW 14.5 11.5 - 15.5 %   Platelets 261 150 - 400 K/uL   nRBC 0.0 0.0 - 0.2 %  hCG, quantitative, pregnancy     Status: Abnormal   Collection Time: 03/30/19  9:35 PM  Result Value Ref Range   hCG, Beta Chain, Quant, S 4,419 (H) <5 mIU/mL        HCG continue to drop appropriately. Pt denies pain at this time. Will reschedule weekly HCGs to start next Monday - msg sent to [facility] to reschedule.   Assessment and Plan   A:  1. Right tubal pregnancy without intrauterine pregnancy    P: Discharge home in stable condition Discussed reasons to return to MAU Msg sent to office to reschedule weekly HCGs to start next Monday  Jorje Guild, NP

## 2019-04-01 ENCOUNTER — Other Ambulatory Visit: Payer: Medicaid Other

## 2019-04-06 ENCOUNTER — Other Ambulatory Visit: Payer: Medicaid Other

## 2019-04-06 ENCOUNTER — Other Ambulatory Visit: Payer: Self-pay | Admitting: *Deleted

## 2019-04-06 DIAGNOSIS — O00101 Right tubal pregnancy without intrauterine pregnancy: Secondary | ICD-10-CM

## 2019-11-20 ENCOUNTER — Encounter: Payer: Self-pay | Admitting: Nurse Practitioner

## 2019-11-20 ENCOUNTER — Ambulatory Visit: Payer: Medicaid Other | Attending: Nurse Practitioner | Admitting: Nurse Practitioner

## 2019-11-20 ENCOUNTER — Other Ambulatory Visit: Payer: Self-pay

## 2019-11-20 DIAGNOSIS — F17210 Nicotine dependence, cigarettes, uncomplicated: Secondary | ICD-10-CM | POA: Insufficient documentation

## 2019-11-20 DIAGNOSIS — Z8249 Family history of ischemic heart disease and other diseases of the circulatory system: Secondary | ICD-10-CM | POA: Insufficient documentation

## 2019-11-20 DIAGNOSIS — I10 Essential (primary) hypertension: Secondary | ICD-10-CM

## 2019-11-20 DIAGNOSIS — K219 Gastro-esophageal reflux disease without esophagitis: Secondary | ICD-10-CM | POA: Insufficient documentation

## 2019-11-20 DIAGNOSIS — Z1231 Encounter for screening mammogram for malignant neoplasm of breast: Secondary | ICD-10-CM

## 2019-11-20 DIAGNOSIS — H1013 Acute atopic conjunctivitis, bilateral: Secondary | ICD-10-CM | POA: Diagnosis not present

## 2019-11-20 DIAGNOSIS — Z01419 Encounter for gynecological examination (general) (routine) without abnormal findings: Secondary | ICD-10-CM | POA: Insufficient documentation

## 2019-11-20 DIAGNOSIS — R7303 Prediabetes: Secondary | ICD-10-CM | POA: Diagnosis not present

## 2019-11-20 DIAGNOSIS — Z124 Encounter for screening for malignant neoplasm of cervix: Secondary | ICD-10-CM

## 2019-11-20 NOTE — Progress Notes (Signed)
Virtual Visit via Telephone Note Due to national recommendations of social distancing due to [place] 19, telehealth visit is felt to be most appropriate for this patient at this time.  I discussed the limitations, risks, security and privacy concerns of performing an evaluation and management service by telephone and the availability of in person appointments. I also discussed with the patient that there may be a patient responsible charge related to this service. The patient expressed understanding and agreed to proceed.    I connected with Sabrina Larson on 11/20/19  at   2:30 PM EST  EDT by telephone and verified that I am speaking with the correct person using two identifiers.   Consent I discussed the limitations, risks, security and privacy concerns of performing an evaluation and management service by telephone and the availability of in person appointments. I also discussed with the patient that there may be a patient responsible charge related to this service. The patient expressed understanding and agreed to proceed.   Location of Patient: Private Residence   Location of Provider: [facility] [employer] [facility]    Persons participating in Telemedicine visit: [name] FNP-BC [place]    History of Present Illness: Telemedicine visit for: Re Establish Care  has a past medical history of GERD (gastroesophageal reflux disease), Hypertension, and Prediabetes.   Prediabetes Currently controlled with diet.  Lab Results  Component Value Date   HGBA1C 6.1 (H) 11/24/2019    Essential Hypertension She has a history of elevated blood pressure readings.  Does not have a monitor/device to check her blood pressure.  Will need to monitor blood pressure at home and report readings prior to starting antihypertensive. Denies chest pain, shortness of breath, palpitations, lightheadedness, dizziness, headaches or BLE edema.  BP Readings  from Last 3 Encounters:  03/30/19 (!) 141/91  03/23/19 138/83  03/19/19 136/84   Vaginitis Frequent urination, thick discharge, odor. No itching or burning. Endorses lower abdominal pain which has now resolved.  Symptoms started several days ago.    Allergic Conjunctivitis Wakes up and has to pry her eyes open in the mornings due to crusted discharge in bilateral eyes. Denies any purulent drainage.    Past Medical History:  Diagnosis Date  . GERD (gastroesophageal reflux disease)   . Hypertension   . Prediabetes     Past Surgical History:  Procedure Laterality Date  . CESAREAN SECTION     2014, 1995  . DILATION AND CURETTAGE OF UTERUS     8 total in office and outpatient  . SKIN GRAFT     at age 73    Family History  Problem Relation Age of Onset  . Asthma Mother   . Hypertension Mother   . Asthma Brother     Social History   Socioeconomic History  . Marital status: Single    Spouse name: Not on file  . Number of children: Not on file  . Years of education: Not on file  . Highest education level: Not on file  Occupational History  . Not on file  Tobacco Use  . Smoking status: Current Every Day Smoker    Packs/day: 0.50  . Smokeless tobacco: Never Used  Substance and Sexual Activity  . Alcohol use: Yes    Alcohol/week: 1.0 - 2.0 standard drinks    Types: 1 - 2 Glasses of wine per week    Comment: rarely   . Drug use: No  . Sexual activity: Yes  Other Topics Concern  . Not on file  Social History Narrative  . Not on file   Social Determinants of Health   Financial Resource Strain:   . Difficulty of Paying Living Expenses: Not on file  Food Insecurity:   . Worried About Charity fundraiser in the Last Year: Not on file  . Ran Out of Food in the Last Year: Not on file  Transportation Needs:   . Lack of Transportation (Medical): Not on file  . Lack of Transportation (Non-Medical): Not on file  Physical Activity:   . Days of Exercise per Week: Not on  file  . Minutes of Exercise per Session: Not on file  Stress:   . Feeling of Stress : Not on file  Social Connections:   . Frequency of Communication with Friends and Family: Not on file  . Frequency of Social Gatherings with Friends and Family: Not on file  . Attends Religious Services: Not on file  . Active Member of Clubs or Organizations: Not on file  . Attends Archivist Meetings: Not on file  . Marital Status: Not on file     Observations/Objective: Awake, alert and oriented x 3   Review of Systems  Constitutional: Negative for fever, malaise/fatigue and weight loss.  HENT: Negative.  Negative for nosebleeds.   Eyes: Positive for discharge and redness. Negative for blurred vision, double vision, photophobia and pain.  Respiratory: Negative.  Negative for cough and shortness of breath.   Cardiovascular: Negative.  Negative for chest pain, palpitations and leg swelling.  Gastrointestinal: Positive for heartburn. Negative for nausea and vomiting.  Musculoskeletal: Negative.  Negative for myalgias.  Neurological: Negative.  Negative for dizziness, focal weakness, seizures and headaches.  Endo/Heme/Allergies: Positive for environmental allergies.  Psychiatric/Behavioral: Negative.  Negative for suicidal ideas.    Assessment and Plan: Sabrina Larson was seen today for establish care.  Diagnoses and all orders for this visit:  Essential hypertension -     CBC; Future -     CMP14+EGFR; Future -     Lipid Panel; Future -     Blood Pressure Monitor DEVI; Please provide patient with insurance approved blood pressure monitor ICD10 (I10)  Encounter for Papanicolaou smear for cervical cancer screening -     Ambulatory referral to Gynecology -     WET PREP; Future  Breast cancer screening by mammogram -     Correct MAMMOGRAM; Future  Allergic conjunctivitis of both eyes -     Ambulatory referral to Ophthalmology -     azelastine (OPTIVAR) 0.05 % ophthalmic solution; Place 1  drop into both eyes 2 (two) times daily.  Prediabetes -     A1c; Future -     urine micro; Future Continue blood sugar control as discussed in office today, low carbohydrate diet, and regular physical exercise as tolerated, 150 minutes per week (30 min each day, 5 days per week, or 50 min 3 days per week).     Follow Up Instructions Return in about 3 months (around 02/18/2020) for HTN.     I discussed the assessment and treatment plan with the patient. The patient was provided an opportunity to ask questions and all were answered. The patient agreed with the plan and demonstrated an understanding of the instructions.   The patient was advised to call back or seek an in-person evaluation if the symptoms worsen or if the condition fails to improve as anticipated.  I provided 16 minutes of non-face-to-face time during  this encounter including median intraservice time, reviewing previous notes, labs, imaging, medications and explaining diagnosis and management.  Gildardo Pounds, FNP-BC

## 2019-11-23 ENCOUNTER — Other Ambulatory Visit: Payer: Medicaid Other

## 2019-11-24 ENCOUNTER — Other Ambulatory Visit: Payer: Self-pay

## 2019-11-24 ENCOUNTER — Other Ambulatory Visit (HOSPITAL_COMMUNITY)
Admission: RE | Admit: 2019-11-24 | Discharge: 2019-11-24 | Disposition: A | Discharge status: Home / Self Care | Payer: Medicaid Other | Source: Ambulatory Visit | Attending: Nurse Practitioner | Admitting: Nurse Practitioner

## 2019-11-24 ENCOUNTER — Ambulatory Visit: Payer: Medicaid Other | Attending: Nurse Practitioner

## 2019-11-24 DIAGNOSIS — R7303 Prediabetes: Secondary | ICD-10-CM | POA: Diagnosis not present

## 2019-11-24 DIAGNOSIS — I10 Essential (primary) hypertension: Secondary | ICD-10-CM

## 2019-11-24 DIAGNOSIS — Z124 Encounter for screening for malignant neoplasm of cervix: Secondary | ICD-10-CM

## 2019-11-25 LAB — LIPID PANEL
Chol/HDL Ratio: 4.1 ratio (ref 0.0–4.4)
Cholesterol, Total: 171 mg/dL (ref 100–199)
HDL: 42 mg/dL (ref >39)
LDL Chol Calc (NIH): 117 mg/dL — ABNORMAL HIGH (ref 0–99)
Triglycerides: 60 mg/dL (ref 0–149)
VLDL Cholesterol Cal: 12 mg/dL (ref 5–40)

## 2019-11-25 LAB — CERVICOVAGINAL ANCILLARY ONLY
Bacterial Vaginitis (gardnerella): POSITIVE — AB
Candida Glabrata: NEGATIVE
Candida Vaginitis: NEGATIVE
Chlamydia: NEGATIVE
Comment: NEGATIVE
Comment: NEGATIVE
Comment: NEGATIVE
Comment: NEGATIVE
Comment: NEGATIVE
Comment: NORMAL
Neisseria Gonorrhea: NEGATIVE
Trichomonas: NEGATIVE

## 2019-11-25 LAB — HEMOGLOBIN A1C
Est. average glucose Bld gHb Est-mCnc: 128 mg/dL
Hgb A1c MFr Bld: 6.1 % — ABNORMAL HIGH (ref 4.8–5.6)

## 2019-11-25 LAB — CMP14+EGFR
ALT: 12 IU/L (ref 0–32)
AST: 13 IU/L (ref 0–40)
Albumin/Globulin Ratio: 1.4 (ref 1.2–2.2)
Albumin: 3.7 g/dL — ABNORMAL LOW (ref 3.8–4.8)
Alkaline Phosphatase: 101 IU/L (ref 39–117)
BUN/Creatinine Ratio: 14 (ref 9–23)
BUN: 10 mg/dL (ref 6–24)
Bilirubin Total: 0.3 mg/dL (ref 0.0–1.2)
CO2: 19 mmol/L — ABNORMAL LOW (ref 20–29)
Calcium: 9 mg/dL (ref 8.7–10.2)
Chloride: 106 mmol/L (ref 96–106)
Creatinine, Ser: 0.74 mg/dL (ref 0.57–1.00)
GFR calc Af Amer: 115 mL/min/{1.73_m2} (ref >59)
GFR calc non Af Amer: 100 mL/min/{1.73_m2} (ref >59)
Globulin, Total: 2.7 g/dL (ref 1.5–4.5)
Glucose: 221 mg/dL — ABNORMAL HIGH (ref 65–99)
Potassium: 4 mmol/L (ref 3.5–5.2)
Sodium: 141 mmol/L (ref 134–144)
Total Protein: 6.4 g/dL (ref 6.0–8.5)

## 2019-11-25 LAB — CBC
Hematocrit: 40.3 % (ref 34.0–46.6)
Hemoglobin: 13.5 g/dL (ref 11.1–15.9)
MCH: 27 pg (ref 26.6–33.0)
MCHC: 33.5 g/dL (ref 31.5–35.7)
MCV: 81 fL (ref 79–97)
Platelets: 349 10*3/uL (ref 150–450)
RBC: 5 x10E6/uL (ref 3.77–5.28)
RDW: 14 % (ref 11.7–15.4)
WBC: 7.4 10*3/uL (ref 3.4–10.8)

## 2019-11-25 LAB — MICROALBUMIN / CREATININE URINE RATIO
Creatinine, Urine: 145.4 mg/dL
Microalb/Creat Ratio: 74 mg/g creat — ABNORMAL HIGH (ref 0–29)
Microalbumin, Urine: 107 ug/mL

## 2019-11-28 ENCOUNTER — Other Ambulatory Visit: Payer: Self-pay | Admitting: Nurse Practitioner

## 2019-11-28 MED ORDER — METRONIDAZOLE 500 MG PO TABS: 500.0000 mg | ORAL_TABLET | Freq: Two times a day (BID) | ORAL | 0 refills | Status: AC

## 2019-12-01 ENCOUNTER — Telehealth: Payer: Self-pay | Admitting: Nurse Practitioner

## 2019-12-01 NOTE — Telephone Encounter (Signed)
Patient called saying she had an OV last week and that her PCP had mentioned that she was going to prescribe her some eye drops. If medication is prescribed please send to  [facility] on [address]. Please f/u

## 2019-12-03 NOTE — Telephone Encounter (Signed)
Spoke to patient. Pt. Request if PCP can send her eye drops for her allergies.

## 2019-12-05 MED ORDER — BLOOD PRESSURE MONITOR DEVI: 0 refills | Status: DC

## 2019-12-06 ENCOUNTER — Encounter: Payer: Self-pay | Admitting: Nurse Practitioner

## 2019-12-06 MED ORDER — AZELASTINE HCL 0.05 % OP SOLN: 1.0000 [drp] | Freq: Two times a day (BID) | OPHTHALMIC | 12 refills | Status: DC

## 2019-12-22 ENCOUNTER — Ambulatory Visit: Payer: Medicaid Other | Admitting: Advanced Practice Midwife

## 2020-01-07 ENCOUNTER — Ambulatory Visit
Admission: RE | Admit: 2020-01-07 | Discharge: 2020-01-07 | Disposition: A | Discharge status: Home / Self Care | Payer: Medicaid Other | Source: Ambulatory Visit | Attending: Nurse Practitioner | Admitting: Nurse Practitioner

## 2020-01-07 ENCOUNTER — Other Ambulatory Visit: Payer: Self-pay

## 2020-01-07 ENCOUNTER — Other Ambulatory Visit: Payer: Self-pay | Admitting: Nurse Practitioner

## 2020-01-07 DIAGNOSIS — Z1231 Encounter for screening mammogram for malignant neoplasm of breast: Secondary | ICD-10-CM

## 2020-01-19 ENCOUNTER — Other Ambulatory Visit (HOSPITAL_COMMUNITY)
Admission: RE | Admit: 2020-01-19 | Discharge: 2020-01-19 | Disposition: A | Discharge status: Home / Self Care | Payer: Medicaid Other | Source: Ambulatory Visit | Attending: Certified Nurse Midwife | Admitting: Certified Nurse Midwife

## 2020-01-19 ENCOUNTER — Ambulatory Visit (INDEPENDENT_AMBULATORY_CARE_PROVIDER_SITE_OTHER): Payer: Medicaid Other | Admitting: Certified Nurse Midwife

## 2020-01-19 ENCOUNTER — Encounter: Payer: Self-pay | Admitting: Certified Nurse Midwife

## 2020-01-19 ENCOUNTER — Telehealth: Payer: Self-pay | Admitting: Nurse Practitioner

## 2020-01-19 ENCOUNTER — Other Ambulatory Visit: Payer: Self-pay

## 2020-01-19 VITALS — BP 136/107 | HR 77 | Ht 66.5 in | Wt 223.3 lb

## 2020-01-19 DIAGNOSIS — Z01419 Encounter for gynecological examination (general) (routine) without abnormal findings: Secondary | ICD-10-CM | POA: Insufficient documentation

## 2020-01-19 DIAGNOSIS — I10 Essential (primary) hypertension: Secondary | ICD-10-CM | POA: Diagnosis not present

## 2020-01-19 DIAGNOSIS — Z113 Encounter for screening for infections with a predominantly sexual mode of transmission: Secondary | ICD-10-CM | POA: Diagnosis not present

## 2020-01-19 NOTE — Progress Notes (Signed)
GYNECOLOGY ANNUAL PREVENTATIVE CARE ENCOUNTER NOTE  History:     Sabrina Larson is a 44 y.o. [id number] female here for a routine annual gynecologic exam.  Current complaints: request STD screening, reports "breaking out" after recent [place] wax. Denies abnormal vaginal bleeding, discharge, pelvic pain, problems with intercourse or other gynecologic concerns.    Patient reports recent treatment of BV and yeast in December 2020- she denies current irritation, discharge or odor.    Gynecologic History Patient's last menstrual period was 12/27/2019 (exact date). Contraception: none Last Pap: 06/2016. Results were: normal with negative HPV Last mammogram: 01/07/20. Results were: normal  Obstetric History OB History  Gravida Para Term Preterm AB Living  15 6 6  0 8 6  SAB TAB Ectopic Multiple Live Births  0 8 0 0      # Outcome Date GA Lbr Len/2nd Weight Sex Delivery Anes PTL Lv  15 Gravida           14 Term 2014     CS-LTranv     13 Term 2011     VBAC     12 Term 2006     VBAC     11 Term 1999     VBAC     10 Term 17     VBAC     9 Term 14     CS-LTranv     8 TAB           7 TAB           6 TAB           5 TAB           4 TAB           3 TAB           2 TAB           1 TAB             Obstetric Comments  TSVD x 4. Term c-section x 2.   6 surgical TABs, 2 medicated TABs    Past Medical History:  Diagnosis Date  . GERD (gastroesophageal reflux disease)   . Hypertension   . Prediabetes     Past Surgical History:  Procedure Laterality Date  . CESAREAN SECTION     2014, 1995  . DILATION AND CURETTAGE OF UTERUS     8 total in office and outpatient  . SKIN GRAFT     at age 12    Current Outpatient Medications on File Prior to Visit  Medication Sig Dispense Refill  . azelastine (OPTIVAR) 0.05 % ophthalmic solution Place 1 drop into both eyes 2 (two) times daily. 6 mL 12  . Blood Pressure Monitor DEVI Please provide patient with insurance approved  blood pressure monitor ICD10 (I10) 1 each 0   No current facility-administered medications on file prior to visit.    No Known Allergies  Social History:  reports that she has been smoking. She has been smoking about 0.50 packs per day. She has never used smokeless tobacco. She reports current alcohol use of about 1.0 - 2.0 standard drinks of alcohol per week. She reports that she does not use drugs.  Family History  Problem Relation Age of Onset  . Asthma Mother   . Hypertension Mother   . Asthma Brother     The following portions of the patient's history were reviewed and updated as appropriate: allergies, current medications, past family history, past  medical history, past social history, past surgical history and problem list.  Review of Systems Pertinent items noted in HPI and remainder of comprehensive ROS otherwise negative.  Physical Exam:  BP (!) 136/107   Pulse 77   Ht 5' 6.5" (1.689 m)   Wt 223 lb 4.8 oz (101.3 kg)   LMP 12/27/2019 (Exact Date)   BMI 35.50 kg/m  CONSTITUTIONAL: Well-developed, obese female in no acute distress.  HENT:  Normocephalic, atraumatic, External right and left ear normal. Oropharynx is clear and moist EYES: Conjunctivae and EOM are normal. Pupils are equal, round, and reactive to light.  SKIN: Skin is warm and dry. No rash noted. Not diaphoretic. No erythema. No pallor. MUSCULOSKELETAL: Normal range of motion. No tenderness.  No cyanosis, clubbing, or edema.  2+ distal pulses. NEUROLOGIC: Alert and oriented to person, place, and time. Normal reflexes, muscle tone coordination.  PSYCHIATRIC: Normal mood and affect. Normal behavior. Normal judgment and thought content. CARDIOVASCULAR: Normal heart rate noted, regular rhythm RESPIRATORY: Clear to auscultation bilaterally. Effort and breath sounds normal, no problems with respiration noted. ABDOMEN: Soft, no distention noted.  No tenderness, rebound or guarding.  PELVIC: few ingrown hair bumps  noted on vulva, normal appearance of urethral meatus; normal appearing vaginal mucosa and cervix.  Small amount of white thin discharge without odor.  Pap smear obtained.  Normal uterine size, no other palpable masses, no uterine or adnexal tenderness.   Assessment and Plan:    1. Encounter for annual routine gynecological examination - Patient doing well, reports first [place] wax recently and after noticed irritation and bumps  - Assessment noted ingrown hair bumps, encouraged patient to exfoliate and use ingrown hair serum on those areas, patient verbalizes understanding  - normal well woman examination today, patient referred here for pap smear and has PCP that manages her  - Cytology - PAP( [facility])  2. Chronic hypertension - Patient has been seen in December at PCP for management of HTN  - Encouraged patient to call PCP and notify of elevated BP here today to be able to manage appropriately   3. Screening for STDs (sexually transmitted diseases) - patient request screening for STDs - Cervicovaginal ancillary only( [place]) - HIV Antibody (routine testing w rflx) - RPR - Hepatitis C Antibody - Hepatitis B Surface AntiGEN  Will follow up results of pap smear/STD screening and manage accordingly. Routine preventative health maintenance measures emphasized. Please refer to After Visit Summary for other counseling recommendations.      Lajean Manes, [place] for [employer], [place]

## 2020-01-20 LAB — HEPATITIS C ANTIBODY: Hep C Virus Ab: <0.1 s/co ratio (ref 0.0–0.9)

## 2020-01-20 LAB — HIV ANTIBODY (ROUTINE TESTING W REFLEX): HIV Screen 4th Generation wRfx: NONREACTIVE

## 2020-01-20 LAB — RPR: RPR Ser Ql: NONREACTIVE

## 2020-01-20 LAB — HEPATITIS B SURFACE ANTIGEN: Hepatitis B Surface Ag: NEGATIVE

## 2020-01-21 LAB — CYTOLOGY - PAP
Chlamydia: NEGATIVE
Comment: NEGATIVE
Comment: NEGATIVE
Comment: NORMAL
Diagnosis: NEGATIVE
High risk HPV: NEGATIVE
Neisseria Gonorrhea: NEGATIVE

## 2020-01-21 LAB — CERVICOVAGINAL ANCILLARY ONLY
Bacterial Vaginitis (gardnerella): POSITIVE — AB
Candida Glabrata: NEGATIVE
Candida Vaginitis: NEGATIVE
Comment: NEGATIVE
Comment: NEGATIVE
Comment: NEGATIVE
Comment: NEGATIVE
Trichomonas: NEGATIVE

## 2020-01-26 DIAGNOSIS — R7309 Other abnormal glucose: Secondary | ICD-10-CM | POA: Diagnosis not present

## 2020-01-26 DIAGNOSIS — H1045 Other chronic allergic conjunctivitis: Secondary | ICD-10-CM | POA: Diagnosis not present

## 2020-01-26 DIAGNOSIS — H04123 Dry eye syndrome of bilateral lacrimal glands: Secondary | ICD-10-CM | POA: Diagnosis not present

## 2020-01-26 DIAGNOSIS — H40013 Open angle with borderline findings, low risk, bilateral: Secondary | ICD-10-CM | POA: Diagnosis not present

## 2020-01-26 LAB — HM DIABETES EYE EXAM

## 2020-01-27 ENCOUNTER — Telehealth: Payer: Self-pay | Admitting: Nurse Practitioner

## 2020-01-27 DIAGNOSIS — H5213 Myopia, bilateral: Secondary | ICD-10-CM | POA: Diagnosis not present

## 2020-02-01 ENCOUNTER — Ambulatory Visit: Payer: Medicaid Other | Admitting: Nurse Practitioner

## 2020-02-09 ENCOUNTER — Other Ambulatory Visit: Payer: Self-pay

## 2020-02-09 ENCOUNTER — Ambulatory Visit: Payer: Medicaid Other | Attending: Nurse Practitioner | Admitting: Nurse Practitioner

## 2020-02-09 ENCOUNTER — Encounter: Payer: Self-pay | Admitting: Nurse Practitioner

## 2020-02-09 VITALS — BP 146/99 | HR 92 | Temp 97.9°F | Ht 66.0 in | Wt 219.0 lb

## 2020-02-09 DIAGNOSIS — Z8249 Family history of ischemic heart disease and other diseases of the circulatory system: Secondary | ICD-10-CM | POA: Insufficient documentation

## 2020-02-09 DIAGNOSIS — I10 Essential (primary) hypertension: Secondary | ICD-10-CM | POA: Diagnosis not present

## 2020-02-09 DIAGNOSIS — F17210 Nicotine dependence, cigarettes, uncomplicated: Secondary | ICD-10-CM | POA: Insufficient documentation

## 2020-02-09 DIAGNOSIS — F17200 Nicotine dependence, unspecified, uncomplicated: Secondary | ICD-10-CM

## 2020-02-09 DIAGNOSIS — R7303 Prediabetes: Secondary | ICD-10-CM | POA: Diagnosis not present

## 2020-02-09 DIAGNOSIS — Z79899 Other long term (current) drug therapy: Secondary | ICD-10-CM | POA: Insufficient documentation

## 2020-02-09 MED ORDER — HYDROCHLOROTHIAZIDE 25 MG PO TABS: 25.0000 mg | ORAL_TABLET | Freq: Every day | ORAL | 3 refills | Status: DC

## 2020-02-09 MED ORDER — BLOOD PRESSURE MONITOR DEVI: 0 refills | Status: DC

## 2020-02-09 NOTE — Patient Instructions (Addendum)
[place], [place] [id number]   Managing Your Hypertension Hypertension is commonly called high blood pressure. This is when the force of your blood pressing against the walls of your arteries is too strong. Arteries are blood vessels that carry blood from your heart throughout your body. Hypertension forces the heart to work harder to pump blood, and may cause the arteries to become narrow or stiff. Having untreated or uncontrolled hypertension can cause heart attack, stroke, kidney disease, and other problems. What are blood pressure readings? A blood pressure reading consists of a higher number over a lower number. Ideally, your blood pressure should be below 120/80. The first ("top") number is called the systolic pressure. It is a measure of the pressure in your arteries as your heart beats. The second ("bottom") number is called the diastolic pressure. It is a measure of the pressure in your arteries as the heart relaxes. What does my blood pressure reading mean? Blood pressure is classified into four stages. Based on your blood pressure reading, your health care provider may use the following stages to determine what type of treatment you need, if any. Systolic pressure and diastolic pressure are measured in a unit called mm Hg. Normal  Systolic pressure: below 123456.  Diastolic pressure: below 80. Elevated  Systolic pressure: [id number].  Diastolic pressure: below 80. Hypertension stage 1  Systolic pressure: 0000000.  Diastolic pressure: [id number]. Hypertension stage 2  Systolic pressure: [id number] or above.  Diastolic pressure: 90 or above. What health risks are associated with hypertension? Managing your hypertension is an important responsibility. Uncontrolled hypertension can lead to:  A heart attack.  A stroke.  A weakened blood vessel (aneurysm).  Heart failure.  Kidney damage.  Eye damage.  Metabolic syndrome.  Memory  and concentration problems. What changes can I make to manage my hypertension? Hypertension can be managed by making lifestyle changes and possibly by taking medicines. Your health care provider will help you make a plan to bring your blood pressure within a normal range. Eating and drinking   Eat a diet that is high in fiber and potassium, and low in salt (sodium), added sugar, and fat. An example eating plan is called the DASH (Dietary Approaches to Stop Hypertension) diet. To eat this way: ? Eat plenty of fresh fruits and vegetables. Try to fill half of your plate at each meal with fruits and vegetables. ? Eat whole grains, such as whole wheat pasta, brown rice, or whole grain bread. Fill about one quarter of your plate with whole grains. ? Eat low-fat diary products. ? Avoid fatty cuts of meat, processed or cured meats, and poultry with skin. Fill about one quarter of your plate with lean proteins such as fish, chicken without skin, beans, eggs, and tofu. ? Avoid premade and processed foods. These tend to be higher in sodium, added sugar, and fat.  Reduce your daily sodium intake. Most people with hypertension should eat less than 1,500 mg of sodium a day.  Limit alcohol intake to no more than 1 drink a day for nonpregnant women and 2 drinks a day for men. One drink equals 12 oz of beer, 5 oz of wine, or 1 oz of hard liquor. Lifestyle  Work with your health care provider to maintain a healthy body weight, or to lose weight. Ask what an ideal weight is for you.  Get at least 30 minutes of exercise that causes your heart to beat faster (aerobic exercise) most  days of the week. Activities may include walking, swimming, or biking.  Include exercise to strengthen your muscles (resistance exercise), such as weight lifting, as part of your weekly exercise routine. Try to do these types of exercises for 30 minutes at least 3 days a week.  Do not use any products that contain nicotine or tobacco,  such as cigarettes and e-cigarettes. If you need help quitting, ask your health care provider.  Control any long-term (chronic) conditions you have, such as high cholesterol or diabetes. Monitoring  Monitor your blood pressure at home as told by your health care provider. Your personal target blood pressure may vary depending on your medical conditions, your age, and other factors.  Have your blood pressure checked regularly, as often as told by your health care provider. Working with your health care provider  Review all the medicines you take with your health care provider because there may be side effects or interactions.  Talk with your health care provider about your diet, exercise habits, and other lifestyle factors that may be contributing to hypertension.  Visit your health care provider regularly. Your health care provider can help you create and adjust your plan for managing hypertension. Will I need medicine to control my blood pressure? Your health care provider may prescribe medicine if lifestyle changes are not enough to get your blood pressure under control, and if:  Your systolic blood pressure is 130 or higher.  Your diastolic blood pressure is 80 or higher. Take medicines only as told by your health care provider. Follow the directions carefully. Blood pressure medicines must be taken as prescribed. The medicine does not work as well when you skip doses. Skipping doses also puts you at risk for problems. Contact a health care provider if:  You think you are having a reaction to medicines you have taken.  You have repeated (recurrent) headaches.  You feel dizzy.  You have swelling in your ankles.  You have trouble with your vision. Get help right away if:  You develop a severe headache or confusion.  You have unusual weakness or numbness, or you feel faint.  You have severe pain in your chest or abdomen.  You vomit repeatedly.  You have trouble  breathing. Summary  Hypertension is when the force of blood pumping through your arteries is too strong. If this condition is not controlled, it may put you at risk for serious complications.  Your personal target blood pressure may vary depending on your medical conditions, your age, and other factors. For most people, a normal blood pressure is less than 120/80.  Hypertension is managed by lifestyle changes, medicines, or both. Lifestyle changes include weight loss, eating a healthy, low-sodium diet, exercising more, and limiting alcohol. This information is not intended to replace advice given to you by your health care provider. Make sure you discuss any questions you have with your health care provider. Document Revised: 03/13/2019 Document Reviewed: 10/17/2016 Elsevier Patient Education  [place].

## 2020-02-09 NOTE — Progress Notes (Signed)
Assessment & Plan:  Sabrina Larson was seen today for blood pressure check.  Diagnoses and all orders for this visit:  Essential hypertension -     hydrochlorothiazide (HYDRODIURIL) 25 MG tablet; Take 1 tablet (25 mg total) by mouth daily. -     Blood Pressure Monitor DEVI; Please provide patient with insurance approved blood pressure monitor ICD10 (I10) -     Basic metabolic panel Continue all antihypertensives as prescribed.  Remember to bring in your blood pressure log with you for your follow up appointment.  DASH/Mediterranean Diets are healthier choices for HTN.   Tobacco dependence Sabrina Larson was counseled on the dangers of tobacco use, and was advised to quit. Reviewed strategies to maximize success, including removing cigarettes and smoking materials from environment, stress management and support of family/friends as well as pharmacological alternatives including: Wellbutrin, Chantix, Nicotine patch, Nicotine gum or lozenges. Smoking cessation support: smoking cessation hotline: 1-800-QUIT-NOW.  Smoking cessation classes are also available through [facility]. Call [phone] or visit our website at [email or web address]   A total of 3 minutes was spent on counseling for smoking cessation and Sabrina Larson is not ready to quit.    Patient has been counseled on age-appropriate routine health concerns for screening and prevention. These are reviewed and up-to-date. Referrals have been placed accordingly. Immunizations are up-to-date or declined.    Subjective:   Chief Complaint  Patient presents with  . Blood Pressure Check    Pt. is here for blood pressure check.    HPI Sabrina Larson 44 y.o. female presents to office today for follow up/BP check.   Essential Hypertension Elevated. Taking HCTZ 25 mg. Taking phentermine 1/2 tab due to HTN. This has been prescribed by another provide.  May need to discontinue if blood pressure continues elevated. I will  have her follow up in 3 weeks for BP recheck. In regard to her smoking she is down to half a pack or less a day. Couldn't continue with nicotine patches due to skin irritation. BP Readings from Last 3 Encounters:  02/09/20 (!) 146/99  01/19/20 (!) 136/107  03/30/19 (!) 141/91    Review of Systems  Constitutional: Negative for fever, malaise/fatigue and weight loss.  HENT: Negative.  Negative for nosebleeds.   Eyes: Negative.  Negative for blurred vision, double vision and photophobia.  Respiratory: Negative.  Negative for cough and shortness of breath.   Cardiovascular: Negative.  Negative for chest pain, palpitations and leg swelling.  Gastrointestinal: Negative.  Negative for heartburn, nausea and vomiting.  Musculoskeletal: Negative.  Negative for myalgias.  Neurological: Negative.  Negative for dizziness, focal weakness, seizures and headaches.  Psychiatric/Behavioral: Negative.  Negative for suicidal ideas.    Past Medical History:  Diagnosis Date  . GERD (gastroesophageal reflux disease)   . Hypertension   . Prediabetes     Past Surgical History:  Procedure Laterality Date  . CESAREAN SECTION     2014, 1995  . DILATION AND CURETTAGE OF UTERUS     8 total in office and outpatient  . SKIN GRAFT     at age 30    Family History  Problem Relation Age of Onset  . Asthma Mother   . Hypertension Mother   . Asthma Brother     Social History Reviewed with no changes to be made today.   Outpatient Medications Prior to Visit  Medication Sig Dispense Refill  . azelastine (OPTIVAR) 0.05 % ophthalmic solution Place 1 drop into both eyes 2 (  two) times daily. 6 mL 12  . Blood Pressure Monitor DEVI Please provide patient with insurance approved blood pressure monitor ICD10 (I10) 1 each 0   No facility-administered medications prior to visit.    No Known Allergies     Objective:    BP (!) 146/99 (BP Location: Left Arm, Patient Position: Sitting, Cuff Size: Normal)    Pulse 92   Temp 97.9 F (36.6 C) (Temporal)   Ht 5\' 6"  (1.676 m)   Wt 219 lb (99.3 kg)   LMP 01/28/2020   SpO2 98%   BMI 35.35 kg/m  Wt Readings from Last 3 Encounters:  02/09/20 219 lb (99.3 kg)  01/19/20 223 lb 4.8 oz (101.3 kg)  03/19/19 207 lb (93.9 kg)    Physical Exam Vitals and nursing note reviewed.  Constitutional:      Appearance: She is well-developed.  HENT:     Head: Normocephalic and atraumatic.  Cardiovascular:     Rate and Rhythm: Normal rate and regular rhythm.     Heart sounds: Normal heart sounds. No murmur. No friction rub. No gallop.   Pulmonary:     Effort: Pulmonary effort is normal. No tachypnea or respiratory distress.     Breath sounds: Normal breath sounds. No decreased breath sounds, wheezing, rhonchi or rales.  Chest:     Chest wall: No tenderness.  Abdominal:     General: Bowel sounds are normal.     Palpations: Abdomen is soft.  Musculoskeletal:        General: Normal range of motion.     Cervical back: Normal range of motion.  Skin:    General: Skin is warm and dry.  Neurological:     Mental Status: She is alert and oriented to person, place, and time.     Coordination: Coordination normal.  Psychiatric:        Behavior: Behavior normal. Behavior is cooperative.        Thought Content: Thought content normal.        Judgment: Judgment normal.          Patient has been counseled extensively about nutrition and exercise as well as the importance of adherence with medications and regular follow-up. The patient was given clear instructions to go to ER or return to medical center if symptoms don't improve, worsen or new problems develop. The patient verbalized understanding.   Follow-up: Return in about 3 weeks (around 03/01/2020) for BP recheck; smoking cessation; BMP.   Gildardo Pounds, FNP-BC [facility] [place], [place]   02/27/2020, 7:56 PM

## 2020-02-10 LAB — BASIC METABOLIC PANEL
BUN/Creatinine Ratio: 17 (ref 9–23)
BUN: 12 mg/dL (ref 6–24)
CO2: 23 mmol/L (ref 20–29)
Calcium: 9.6 mg/dL (ref 8.7–10.2)
Chloride: 105 mmol/L (ref 96–106)
Creatinine, Ser: 0.69 mg/dL (ref 0.57–1.00)
GFR calc Af Amer: 123 mL/min/{1.73_m2} (ref >59)
GFR calc non Af Amer: 107 mL/min/{1.73_m2} (ref >59)
Glucose: 92 mg/dL (ref 65–99)
Potassium: 3.9 mmol/L (ref 3.5–5.2)
Sodium: 141 mmol/L (ref 134–144)

## 2020-02-12 DIAGNOSIS — I10 Essential (primary) hypertension: Secondary | ICD-10-CM | POA: Diagnosis not present

## 2020-02-27 ENCOUNTER — Encounter: Payer: Self-pay | Admitting: Nurse Practitioner

## 2020-03-11 DIAGNOSIS — H524 Presbyopia: Secondary | ICD-10-CM | POA: Diagnosis not present

## 2020-05-24 ENCOUNTER — Ambulatory Visit: Payer: Medicaid Other | Attending: Nurse Practitioner | Admitting: Nurse Practitioner

## 2020-05-24 ENCOUNTER — Other Ambulatory Visit: Payer: Self-pay

## 2020-05-24 ENCOUNTER — Encounter: Payer: Self-pay | Admitting: Nurse Practitioner

## 2020-05-24 DIAGNOSIS — D649 Anemia, unspecified: Secondary | ICD-10-CM | POA: Insufficient documentation

## 2020-05-24 DIAGNOSIS — T86829 Unspecified complication of skin graft (allograft) (autograft): Secondary | ICD-10-CM

## 2020-05-24 DIAGNOSIS — T22041S Burn of unspecified degree of right axilla, sequela: Secondary | ICD-10-CM | POA: Diagnosis not present

## 2020-05-24 DIAGNOSIS — X088XXS Exposure to other specified smoke, fire and flames, sequela: Secondary | ICD-10-CM | POA: Diagnosis not present

## 2020-05-24 DIAGNOSIS — Z79899 Other long term (current) drug therapy: Secondary | ICD-10-CM | POA: Insufficient documentation

## 2020-05-24 DIAGNOSIS — T798XXS Other early complications of trauma, sequela: Secondary | ICD-10-CM | POA: Diagnosis not present

## 2020-05-24 DIAGNOSIS — Z8249 Family history of ischemic heart disease and other diseases of the circulatory system: Secondary | ICD-10-CM | POA: Insufficient documentation

## 2020-05-24 DIAGNOSIS — R7303 Prediabetes: Secondary | ICD-10-CM | POA: Insufficient documentation

## 2020-05-24 DIAGNOSIS — R3915 Urgency of urination: Secondary | ICD-10-CM | POA: Insufficient documentation

## 2020-05-24 DIAGNOSIS — J302 Other seasonal allergic rhinitis: Secondary | ICD-10-CM | POA: Insufficient documentation

## 2020-05-24 DIAGNOSIS — T2200XS Burn of unspecified degree of shoulder and upper limb, except wrist and hand, unspecified site, sequela: Secondary | ICD-10-CM | POA: Insufficient documentation

## 2020-05-24 DIAGNOSIS — Z87891 Personal history of nicotine dependence: Secondary | ICD-10-CM | POA: Insufficient documentation

## 2020-05-24 DIAGNOSIS — I10 Essential (primary) hypertension: Secondary | ICD-10-CM | POA: Insufficient documentation

## 2020-05-24 DIAGNOSIS — E669 Obesity, unspecified: Secondary | ICD-10-CM | POA: Diagnosis not present

## 2020-05-24 DIAGNOSIS — K219 Gastro-esophageal reflux disease without esophagitis: Secondary | ICD-10-CM | POA: Insufficient documentation

## 2020-05-24 DIAGNOSIS — Z130 Encounter for screening for diseases of the blood and blood-forming organs and certain disorders involving the immune mechanism: Secondary | ICD-10-CM | POA: Diagnosis not present

## 2020-05-24 MED ORDER — CETIRIZINE HCL 10 MG PO TABS: 10.0000 mg | ORAL_TABLET | Freq: Every day | ORAL | 11 refills | Status: DC
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
## 2020-05-25 ENCOUNTER — Other Ambulatory Visit: Payer: Self-pay

## 2020-05-25 ENCOUNTER — Ambulatory Visit: Payer: Medicaid Other | Attending: Nurse Practitioner

## 2020-05-25 ENCOUNTER — Ambulatory Visit (HOSPITAL_BASED_OUTPATIENT_CLINIC_OR_DEPARTMENT_OTHER): Payer: Medicaid Other | Admitting: Pharmacist

## 2020-05-25 ENCOUNTER — Encounter: Payer: Self-pay | Admitting: Pharmacist

## 2020-05-25 VITALS — BP 106/76 | HR 82

## 2020-05-25 DIAGNOSIS — R3915 Urgency of urination: Secondary | ICD-10-CM

## 2020-05-25 DIAGNOSIS — I10 Essential (primary) hypertension: Secondary | ICD-10-CM | POA: Insufficient documentation

## 2020-05-25 DIAGNOSIS — Z87891 Personal history of nicotine dependence: Secondary | ICD-10-CM | POA: Insufficient documentation

## 2020-05-25 DIAGNOSIS — Z8249 Family history of ischemic heart disease and other diseases of the circulatory system: Secondary | ICD-10-CM | POA: Diagnosis not present

## 2020-05-25 DIAGNOSIS — R7303 Prediabetes: Secondary | ICD-10-CM | POA: Diagnosis not present

## 2020-05-25 DIAGNOSIS — Z79899 Other long term (current) drug therapy: Secondary | ICD-10-CM | POA: Insufficient documentation

## 2020-05-25 DIAGNOSIS — D649 Anemia, unspecified: Secondary | ICD-10-CM | POA: Diagnosis not present

## 2020-05-25 DIAGNOSIS — Z130 Encounter for screening for diseases of the blood and blood-forming organs and certain disorders involving the immune mechanism: Secondary | ICD-10-CM | POA: Diagnosis not present

## 2020-05-25 NOTE — Progress Notes (Signed)
   S:    PCP: Zelda  Patient arrives in good spirits.  Presents to the clinic for hypertension evaluation, counseling, and management.  Patient was referred and last seen by Primary Care Provider on 05/24/2020. She was originally referred in March of this year. BP at that visit was 146/99 and Zelda started HCTZ.  Medication adherence reported.  Current BP Medications include:  HCTZ 25 mg daily  Dietary habits include: compliant with salt restriction; has eliminated soda/caffeine from diet Exercise habits include: currently on a weight loss program; has plans to begin working out but no activity currently  Family / Social history:  - Asthma, HTN - Tobacco: former smoker (quit ~1.5 months ago) - Alcohol: rarely    O:  Vitals:   05/25/20 1015  BP: 106/76  Pulse: 82    Home BP readings: none   Last 3 Office BP readings: BP Readings from Last 3 Encounters:  05/25/20 106/76  02/09/20 (!) 146/99  01/19/20 (!) 136/107    BMET    Component Value Date/Time   NA 141 02/09/2020 1519   K 3.9 02/09/2020 1519   CL 105 02/09/2020 1519   CO2 23 02/09/2020 1519   GLUCOSE 92 02/09/2020 1519   GLUCOSE 114 (H) 03/19/2019 1517   BUN 12 02/09/2020 1519   CREATININE 0.69 02/09/2020 1519   CREATININE 0.63 04/17/2016 1737   CALCIUM 9.6 02/09/2020 1519   GFRNONAA 107 02/09/2020 1519   GFRNONAA >89 04/17/2016 1737   GFRAA 123 02/09/2020 1519   GFRAA >89 04/17/2016 1737    Renal function: CrCl cannot be calculated (Patient's most recent lab result is older than the maximum 21 days allowed.).  Clinical ASCVD: No  The 10-year ASCVD risk score Mikey Bussing DC Jr., et al., 2013) is: 2.7%   Values used to calculate the score:     Age: 43 years     Sex: Female     Is Non-Hispanic African American: Yes     Diabetic: Yes     Tobacco smoker: No     Systolic Blood Pressure: 154 mmHg     Is BP treated: Yes     HDL Cholesterol: 42 mg/dL     Total Cholesterol: 171 mg/dL    A/P: Hypertension  longstanding currently at goal on current medications. BP Goal = < 130/80 mmHg. Medication adherence reported. Pt also endorses making big changes to her lifestyle since being placed on anti-HTN medications.   -Continued HCTZ 25 mg daily.  -F/u labs ordered - labs already entered by PCP  -Counseled on lifestyle modifications for blood pressure control including reduced dietary sodium, increased exercise, adequate sleep.  Results reviewed and written information provided. Total time in face-to-face counseling 15 minutes.   F/U Clinic Visit w/ PCP.  Benard Halsted, PharmD, [place] [phone]

## 2020-05-26 LAB — MICROSCOPIC EXAMINATION
Casts: NONE SEEN /lpf
WBC, UA: >30 /hpf — AB (ref 0–5)

## 2020-05-26 LAB — URINALYSIS, COMPLETE
Bilirubin, UA: NEGATIVE
Glucose, UA: NEGATIVE
Ketones, UA: NEGATIVE
Nitrite, UA: NEGATIVE
Specific Gravity, UA: 1.022 (ref 1.005–1.030)
Urobilinogen, Ur: 1 mg/dL (ref 0.2–1.0)
pH, UA: 7 (ref 5.0–7.5)

## 2020-05-26 LAB — CBC
Hematocrit: 39.9 % (ref 34.0–46.6)
Hemoglobin: 13.6 g/dL (ref 11.1–15.9)
MCH: 27.1 pg (ref 26.6–33.0)
MCHC: 34.1 g/dL (ref 31.5–35.7)
MCV: 80 fL (ref 79–97)
Platelets: 408 10*3/uL (ref 150–450)
RBC: 5.01 x10E6/uL (ref 3.77–5.28)
RDW: 13.7 % (ref 11.7–15.4)
WBC: 7.9 10*3/uL (ref 3.4–10.8)

## 2020-05-26 LAB — HEMOGLOBIN A1C
Est. average glucose Bld gHb Est-mCnc: 126 mg/dL
Hgb A1c MFr Bld: 6 % — ABNORMAL HIGH (ref 4.8–5.6)

## 2020-06-02 ENCOUNTER — Other Ambulatory Visit: Payer: Self-pay | Admitting: Nurse Practitioner

## 2020-06-02 MED ORDER — NITROFURANTOIN MONOHYD MACRO 100 MG PO CAPS: 100.0000 mg | ORAL_CAPSULE | Freq: Two times a day (BID) | ORAL | 0 refills | Status: AC

## 2020-07-19 ENCOUNTER — Ambulatory Visit: Payer: Medicaid Other | Attending: Nurse Practitioner | Admitting: Nurse Practitioner

## 2020-07-19 ENCOUNTER — Encounter: Payer: Self-pay | Admitting: Nurse Practitioner

## 2020-07-19 ENCOUNTER — Other Ambulatory Visit: Payer: Self-pay

## 2020-07-19 VITALS — BP 150/92 | HR 75 | Temp 97.7°F | Ht 66.0 in | Wt 203.0 lb

## 2020-07-19 DIAGNOSIS — K219 Gastro-esophageal reflux disease without esophagitis: Secondary | ICD-10-CM

## 2020-07-19 DIAGNOSIS — I10 Essential (primary) hypertension: Secondary | ICD-10-CM

## 2020-07-19 DIAGNOSIS — Z77120 Contact with and (suspected) exposure to mold (toxic): Secondary | ICD-10-CM | POA: Diagnosis not present

## 2020-07-19 DIAGNOSIS — L738 Other specified follicular disorders: Secondary | ICD-10-CM

## 2020-07-19 MED ORDER — FAMOTIDINE 20 MG PO TABS: 20.0000 mg | ORAL_TABLET | Freq: Two times a day (BID) | ORAL | 2 refills | Status: DC

## 2020-07-19 NOTE — Progress Notes (Signed)
Assessment & Plan:  Sabrina Larson was seen today for bumps on her left upper thigh.  Diagnoses and all orders for this visit:  Pseudofolliculitis Apply warm compresses to affected area.   Essential hypertension -     Basic metabolic panel  Mold suspected exposure -     Allergen Profile, Mold  GERD without esophagitis -     famotidine (PEPCID) 20 MG tablet; Take 1 tablet (20 mg total) by mouth 2 (two) times daily.    Patient has been counseled on age-appropriate routine health concerns for screening and prevention. These are reviewed and up-to-date. Referrals have been placed accordingly. Immunizations are up-to-date or declined.    Subjective:   Chief Complaint  Patient presents with   bumps on her left upper thigh    Pt. stated it has been a week she had a bump on her left upper thigh and hurts to touch.    HPI Sabrina Larson 44 y.o. female presents to office today with complaints of Pseudofolliculitis of the pubic area (she last shaved the area with a razor) and is also requesting to be tested for mold exposure. Endorses symptoms of short term memory loss, nausea, shortness of breath and fatigue. She believes she has been exposed to mold where she currently resides.     Review of Systems  Constitutional: Positive for malaise/fatigue. Negative for fever and weight loss.  HENT: Negative.  Negative for nosebleeds.   Eyes: Negative.  Negative for blurred vision, double vision and photophobia.  Respiratory: Positive for shortness of breath. Negative for cough.   Cardiovascular: Negative.  Negative for chest pain, palpitations and leg swelling.  Gastrointestinal: Positive for heartburn and nausea. Negative for vomiting.  Musculoskeletal: Negative.  Negative for myalgias.  Skin:       SEE HPI  Neurological: Negative.  Negative for dizziness, focal weakness, seizures and headaches.  Psychiatric/Behavioral: Positive for memory loss. Negative for suicidal ideas.    Past  Medical History:  Diagnosis Date   GERD (gastroesophageal reflux disease)    Hypertension    Prediabetes     Past Surgical History:  Procedure Laterality Date   CESAREAN SECTION     2014, 1995   DILATION AND CURETTAGE OF UTERUS     8 total in office and outpatient   SKIN GRAFT     at age 22    Family History  Problem Relation Age of Onset   Asthma Mother    Hypertension Mother    Asthma Brother     Social History Reviewed with no changes to be made today.   Outpatient Medications Prior to Visit  Medication Sig Dispense Refill   azelastine (OPTIVAR) 0.05 % ophthalmic solution Place 1 drop into both eyes 2 (two) times daily. 6 mL 12   Blood Pressure Monitor DEVI Please provide patient with insurance approved blood pressure monitor ICD10 (I10) 1 each 0   cetirizine (ZYRTEC) 10 MG tablet Take 1 tablet (10 mg total) by mouth daily. 30 tablet 11   hydrochlorothiazide (HYDRODIURIL) 25 MG tablet Take 1 tablet (25 mg total) by mouth daily. 90 tablet 3   No facility-administered medications prior to visit.    No Known Allergies     Objective:    BP (!) 150/92 (BP Location: Left Arm, Patient Position: Sitting, Cuff Size: Large)    Pulse 75    Temp 97.7 F (36.5 C) (Temporal)    Ht 5\' 6"  (1.676 m)    Wt 203 lb (92.1 kg)  SpO2 100%    BMI 32.77 kg/m  Wt Readings from Last 3 Encounters:  07/19/20 203 lb (92.1 kg)  02/09/20 219 lb (99.3 kg)  01/19/20 223 lb 4.8 oz (101.3 kg)    Physical Exam Vitals and nursing note reviewed.  Constitutional:      Appearance: She is well-developed.  HENT:     Head: Normocephalic and atraumatic.  Cardiovascular:     Rate and Rhythm: Normal rate and regular rhythm.     Heart sounds: Normal heart sounds. No murmur heard.  No friction rub. No gallop.   Pulmonary:     Effort: Pulmonary effort is normal. No tachypnea or respiratory distress.     Breath sounds: Normal breath sounds. No decreased breath sounds, wheezing, rhonchi  or rales.  Chest:     Chest wall: No tenderness.  Abdominal:     General: Bowel sounds are normal.     Palpations: Abdomen is soft.  Genitourinary:    Labia:        Left: Tenderness present.     Musculoskeletal:        General: Normal range of motion.     Cervical back: Normal range of motion.  Skin:    General: Skin is warm and dry.  Neurological:     Mental Status: She is alert and oriented to person, place, and time.     Coordination: Coordination normal.  Psychiatric:        Behavior: Behavior normal. Behavior is cooperative.        Thought Content: Thought content normal.        Judgment: Judgment normal.          Patient has been counseled extensively about nutrition and exercise as well as the importance of adherence with medications and regular follow-up. The patient was given clear instructions to go to ER or return to medical center if symptoms don't improve, worsen or new problems develop. The patient verbalized understanding.   Follow-up: Return in about 3 months (around 10/19/2020).   Gildardo Pounds, FNP-BC [facility] [place], [place]   07/19/2020, 10:11 PM

## 2020-07-19 NOTE — Patient Instructions (Addendum)
Ingrown Hair  An ingrown hair is a hair that curls and re-enters the skin instead of growing straight out of the skin. An ingrown hair can develop in any part of the skin that hair is removed from. An ingrown hair may cause small pockets of infection. What are the causes? An ingrown hair may be caused by:  Shaving.  Tweezing.  Waxing.  Using a hair removal cream. What increases the risk? You are more likely to develop this condition if you have curly hair. What are the signs or symptoms? Symptoms of an ingrown hair may include:  Small bumps on the skin. The bumps may be filled with pus.  Pain.  Itching. How is this diagnosed? An ingrown hair is diagnosed by a skin exam. How is this treated? Treatment is often not needed unless the ingrown hair has caused an infection. If needed, treatment may include:  Applying prescription creams to the skin. This can help with inflammation.  Applying warm compresses to the skin. This can help soften the skin.  Taking antibiotic medicine. An antibiotic may be prescribed if the infection is severe.  Retracting and releasing the ingrown hair tips. Follow these instructions at home:  Medicines  Take, apply, or use over-the-counter and prescription medicines only as told by your health care provider. This includes any prescription creams.  If you were prescribed an antibiotic medicine, take it as told by your health care provider. Do not stop using the antibiotic even if you start to feel better. General instructions  Do not shave irritated areas of skin. You may start shaving these areas again once the irritation has gone away.  To help remove ingrown hairs on your face, you may use a facial sponge in a gentle circular motion.  Do not pick or squeeze the irritated area of skin as this may cause infection and scarring. Managing pain and swelling  If directed, apply heat to the affected area as often as told by your health care  provider. Use the heat source that your health care provider recommends, such as a moist heat pack or a heating pad. ? Place a towel between your skin and the heat source. ? Leave the heat on for 20-30 minutes. ? Remove the heat if your skin turns bright red. This is especially important if you are unable to feel pain, heat, or cold. You may have a greater risk of getting burned. How is this prevented?  Shower before shaving.  Wrap areas that you are going to shave in warm, moist wraps for several minutes before shaving. The warmth and moisture help to soften the hairs and makes ingrown hairs less likely.  Use thick shaving gels.  Use a razor that cuts hair slightly above your skin, or use an electric shaver with a long shave setting.  Shave in the direction of hair growth.  Avoid making multiple razor strokes.  Apply a moisturizing lotion after shaving. Summary  An ingrown hair is a hair that curls and re-enters the skin instead of growing straight out of the skin.  Treatment is often not needed unless the ingrown hair has caused an infection.  Take, apply, or use over-the-counter and prescription medicines only as told by your health care provider. This includes any prescription creams.  Do not shave irritated areas of skin. You may start shaving these areas again once the irritation has gone away.  If directed, apply heat to the affected area. Use the heat source that your health care provider  recommends, such as a moist heat pack or a heating pad. This information is not intended to replace advice given to you by your health care provider. Make sure you discuss any questions you have with your health care provider. Document Revised: 05/28/2018 Document Reviewed: 05/28/2018 Elsevier Patient Education  [place].

## 2020-07-21 ENCOUNTER — Other Ambulatory Visit: Payer: Self-pay | Admitting: Nurse Practitioner

## 2020-07-21 DIAGNOSIS — Z91048 Other nonmedicinal substance allergy status: Secondary | ICD-10-CM

## 2020-07-21 LAB — ALLERGEN PROFILE, MOLD
Alternaria Alternata IgE: 0.31 kU/L — AB
Aspergillus Fumigatus IgE: 0.63 kU/L — AB
Aureobasidi Pullulans IgE: 0.46 kU/L — AB
Candida Albicans IgE: 0.84 kU/L — AB
Cladosporium Herbarum IgE: 0.32 kU/L — AB
M009-IgE Fusarium proliferatum: <0.1 kU/L
M014-IgE Epicoccum purpur: 0.11 kU/L — AB
Mucor Racemosus IgE: <0.1 kU/L
Penicillium Chrysogen IgE: 1.06 kU/L — AB
Phoma Betae IgE: 0.15 kU/L — AB
Setomelanomma Rostrat: 0.12 kU/L — AB
Stemphylium Herbarum IgE: 0.26 kU/L — AB

## 2020-07-21 LAB — BASIC METABOLIC PANEL
BUN/Creatinine Ratio: 14 (ref 9–23)
BUN: 9 mg/dL (ref 6–24)
CO2: 24 mmol/L (ref 20–29)
Calcium: 9.5 mg/dL (ref 8.7–10.2)
Chloride: 106 mmol/L (ref 96–106)
Creatinine, Ser: 0.63 mg/dL (ref 0.57–1.00)
GFR calc Af Amer: 127 mL/min/{1.73_m2} (ref >59)
GFR calc non Af Amer: 110 mL/min/{1.73_m2} (ref >59)
Glucose: 109 mg/dL — ABNORMAL HIGH (ref 65–99)
Potassium: 4 mmol/L (ref 3.5–5.2)
Sodium: 142 mmol/L (ref 134–144)

## 2020-07-26 ENCOUNTER — Encounter: Payer: Self-pay | Admitting: Plastic Surgery

## 2020-07-26 ENCOUNTER — Ambulatory Visit (INDEPENDENT_AMBULATORY_CARE_PROVIDER_SITE_OTHER): Payer: Medicaid Other | Admitting: Plastic Surgery

## 2020-07-26 ENCOUNTER — Other Ambulatory Visit: Payer: Self-pay

## 2020-07-26 DIAGNOSIS — L905 Scar conditions and fibrosis of skin: Secondary | ICD-10-CM | POA: Diagnosis not present
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
## 2020-08-11 ENCOUNTER — Ambulatory Visit: Payer: Self-pay | Admitting: Allergy

## 2020-08-12 ENCOUNTER — Telehealth: Payer: Self-pay

## 2020-08-12 NOTE — Telephone Encounter (Signed)
Pt. Request if PCP can refer her to mental health therapy.

## 2020-08-14 NOTE — Telephone Encounter (Signed)
?   Reason for referral?

## 2020-08-15 NOTE — Telephone Encounter (Signed)
Attempt to reach patient. °No answer and LVM.  °

## 2020-08-18 NOTE — Telephone Encounter (Signed)
Pt request CB sorry missed call. Called during lunch hrs

## 2020-08-18 NOTE — Telephone Encounter (Signed)
Pt. Stated she feel like she is emotional unstable due to a issue going on w/ her life and request if she can refer to a therapist.

## 2020-08-22 ENCOUNTER — Other Ambulatory Visit: Payer: Self-pay | Admitting: Nurse Practitioner

## 2020-08-22 DIAGNOSIS — F419 Anxiety disorder, unspecified: Secondary | ICD-10-CM

## 2020-08-26 ENCOUNTER — Ambulatory Visit: Payer: Medicaid Other | Attending: Plastic Surgery | Admitting: Physical Therapy

## 2020-08-29 NOTE — Progress Notes (Signed)
New Patient Note  RE: Sabrina Larson MRN: [id number] DOB: 14-Jan-1976 Date of Office Visit: 08/30/2020  Referring provider: Gildardo Pounds, NP Primary care provider: Gildardo Pounds, NP  Chief Complaint: mold  History of Present Illness: I had the pleasure of seeing Sabrina Larson for initial evaluation at the [facility] on 08/30/2020. She is a 44 y.o. female, who is referred here by Gildardo Pounds, NP for the evaluation of mold allergy.  Family has been living in the same apartment since February 2020 and had a couple of leaks in the apartment. She noted some mold in the kitchen area, bathrooms, baseboard and hallway closet. The leak was fixed and she has been trying to clean up the moldy areas but they still reoccur.    She had mold testing done by a [place] and was told there was mold in the apartment.   She reports symptoms of headaches, fatigue, nausea, itchy skin, coughing, shortness of breath which has been slowly getting better. Symptoms have been going on for a few months. The symptoms are worse when at the home. Anosmia: no. Headache: yes. She has used zyrtec, Flonase, motrin, tylenol, Afrin, Mucinex with some improvement in symptoms. Sinus infections: denies. Previous work up includes: bloodwork on 07/19/2020 which was positive to mold. Previous ENT evaluation: no. History of reflux: yes and takes medications as needed.  Component     Latest Ref Rng & Units 07/19/2020  Penicillium Chrysogen IgE     Class II kU/L 1.06 (A)  Cladosporium Herbarum IgE     Class I kU/L 0.32 (A)  Aspergillus Fumigatus IgE     Class II kU/L 0.63 (A)  Mucor Racemosus IgE     Class 0 kU/L <0.10  Candida Albicans IgE     Class II kU/L 0.84 (A)  Alternaria Alternata IgE     Class 0/I kU/L 0.31 (A)  Setomelanomma Rostrat     Class 0/I kU/L 0.12 (A)  M009-IgE Fusarium proliferatum     Class 0 kU/L <0.10  Aureobasidi Pullulans IgE     Class I kU/L 0.46  (A)  Phoma Betae IgE     Class 0/I kU/L 0.15 (A)  M014-IgE Epicoccum purpur     Class 0/I kU/L 0.11 (A)  Stemphylium Herbarum IgE     Class 0/I kU/L 0.26 (A)    Assessment and Plan: Sabrina Larson is a 44 y.o. female with: Other allergic rhinitis Mold issues in current apartment. Water leak was fixed but mold is still present. Concerned if the mold is causing headaches, fatigue, nausea, itchy skin, coughing and shortness of breath. 07/19/2020 immunocap which was positive to several molds.   Today's skin testing showed: Positive to grass, weed, trees, mold, dust mites, cat dog, cockroach. Results given.   Start environmental control measures as below.  May use over the counter antihistamines such as Zyrtec (cetirizine), Claritin (loratadine), Allegra (fexofenadine), or Xyzal (levocetirizine) daily as needed. May take twice a day if needed. Start Singulair (montelukast) 10mg  daily at night. Cautioned that in some children/adults can experience behavioral changes including hyperactivity, agitation, depression, sleep disturbances and suicidal ideations. These side effects are rare, but if you notice them you should notify me and discontinue Singulair (montelukast).  May use olopatadine eye drops 0.2% once a day as needed for itchy/watery eyes.  May use Flonase (fluticasone) nasal spray 1 spray per nostril twice a day as needed for nasal congestion.   Regarding the mold - see  information below. ? Use a Dehumidifier during the spring through fall. ? Fix the source of water leakage. ? Wipe off mold with bleach.  ? Best solution is to move out of the current home.   Allergic conjunctivitis of both eyes  See assessment and plan as above for allergic rhinitis.  Shortness of breath History of chest tightness, shortness of breath, coughing and wheezing with bronchitis requiring albuterol inhaler. Smokes 7 cigs/day.   Today's spirometry was normal.  Start Singulair 10 mg daily as  above.  Monitor symptoms.  Repeat spirometry at next visit.   Return in about 3 months (around 11/29/2020).  Meds ordered this encounter  Medications  . montelukast (SINGULAIR) 10 MG tablet    Sig: Take 1 tablet (10 mg total) by mouth at bedtime.    Dispense:  30 tablet    Refill:  5  . Olopatadine HCl 0.2 % SOLN    Sig: Apply 1 drop to eye daily as needed (itchy/watery eyes).    Dispense:  2.5 mL    Refill:  5  . fluticasone (FLONASE) 50 MCG/ACT nasal spray    Sig: Place 1 spray into both nostrils 2 (two) times daily as needed for allergies or rhinitis.    Dispense:  16 g    Refill:  5   Other allergy screening: Asthma: No She reports symptoms of chest tightness, shortness of breath, coughing, wheezing with bronchitis. Current medications include albuterol prn which help. History of pneumonia: yes. Smoking exposure: 7 cigs per day. Up to date with flu vaccine: no. Up to date with pneumonia vaccine: no. Up to date with COVID-19 vaccine: yes.   Food allergy: no Medication allergy:  Had an allergic reaction to a type of antibiotic that was given to her for a kidney infection - patient is not sure of the name.  Hymenoptera allergy: no Urticaria: no Eczema:no History of recurrent infections suggestive of immunodeficency: no  Diagnostics: Spirometry:  Tracings reviewed. Her effort: Good reproducible efforts. FVC: 2.65L FEV1: 2.10L, 79% predicted FEV1/FVC ratio: 79% Interpretation: Spirometry consistent with normal pattern.  Please see scanned spirometry results for details.  Skin Testing: Environmental allergy panel. Positive to grass, weed, trees, mold, dust mites, cat dog, cockroach. Results discussed with patient/family.  Airborne Adult Perc - 08/30/20 1403    Time Antigen Placed 1403    Allergen Manufacturer Lavella Hammock    Location Back    Number of Test 59    Panel 1 Select    1. Control-Buffer 50% Glycerol Negative    2. Control-Histamine 1 mg/ml 2+    3. Albumin  saline Negative    4. Blue Ridge Negative    5. [place] Negative    6. Johnson Negative    7. [place] Blue Negative    8. Meadow Fescue Negative    9. Perennial Rye Negative    10. Sweet Vernal Negative    11. Timothy Negative    12. Cocklebur Negative    13. Burweed Marshelder Negative    14. Ragweed, short Negative    15. Ragweed, Giant Negative    16. Plantain,  English 2+    17. Lamb's Quarters Negative    18. Sheep Sorrell Negative    19. Rough Pigweed Negative    20. Marsh Elder, Rough Negative    21. Mugwort, Common Negative    22. Ash mix 4+    23. Birch mix 2+    24. Beech American Negative    25. Box, Elder 2+  [place], red Negative    27. Cottonwood, [place] Negative    28. Elm mix Negative    29. Hickory 4+    30. Maple mix 2+    31. Oak, [place] mix Negative    32. Pecan Pollen 2+    33. Pine mix Negative    34. Sycamore Eastern 3+    35. Whitley, Black Pollen 2+    36. Alternaria alternata --   +/-   37. Cladosporium Herbarum --   +/-   38. Aspergillus mix 2+    39. Penicillium mix 2+    40. Bipolaris sorokiniana (Helminthosporium) --   +/-   41. Drechslera spicifera (Curvularia) Negative    42. Mucor plumbeus Negative    43. Fusarium moniliforme Negative    44. Aureobasidium pullulans (pullulara) --   +/-   45. Rhizopus oryzae Negative    46. Botrytis cinera Negative    47. Epicoccum nigrum --   +/-   48. Phoma betae Negative    49. Candida Albicans 2+    50. Trichophyton mentagrophytes 2+    51. Mite, D Farinae  5,000 AU/ml 4+    52. Mite, D Pteronyssinus  5,000 AU/ml 4+    53. Cat Hair 10,000 BAU/ml 3+    54.  Dog Epithelia Negative    55. Mixed Feathers Negative    56. Horse Epithelia Negative    57. Cockroach, German 3+    58. Mouse Negative    59. Tobacco Leaf Negative          Intradermal - 08/30/20 1449    Time Antigen Placed 1449    Allergen Manufacturer Lavella Hammock    Location Arm    Number of Test 6    Control Negative    [place] 2+     Johnson 2+    7 Grass 2+    Ragweed mix Negative    Dog 3+           Past Medical History: Patient Active Problem List   Diagnosis Date Noted  . Other allergic rhinitis 08/30/2020  . Tobacco user 08/30/2020  . Shortness of breath 08/30/2020  . Allergic conjunctivitis of both eyes 08/30/2020  . 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 Ectopic pregnancy 03/19/2019  . History of gastric ulcer 12/24/2017  . GERD (gastroesophageal reflux disease) 01/22/2017  . Acute conjunctivitis of both eyes 01/22/2017  . Hypertension 07/05/2016  . Prediabetes 05/02/2016   Past Medical History:  Diagnosis Date  . GERD (gastroesophageal reflux disease)   . Hypertension   . Prediabetes    Past Surgical History: Past Surgical History:  Procedure Laterality Date  . CESAREAN SECTION     2014, 1995  . DILATION AND CURETTAGE OF UTERUS     8 total in office and outpatient  . SKIN GRAFT     at age 43   Medication List:  Current Outpatient Medications  Medication Sig Dispense Refill  . azelastine (OPTIVAR) 0.05 % ophthalmic solution Place 1 drop into both eyes 2 (two) times daily. 6 mL 12  . Blood Pressure Monitor DEVI Please provide patient with insurance approved blood pressure monitor ICD10 (I10) 1 each 0  . cetirizine (ZYRTEC) 10 MG tablet Take 1 tablet (10 mg total) by mouth daily. (Patient taking differently: Take 10 mg by mouth daily. Taking as needed) 30 tablet 11  . famotidine (PEPCID) 20 MG tablet Take 1 tablet (20 mg total) by mouth 2 (two) times daily. (Patient taking  differently: Take 20 mg by mouth 2 (two) times daily. Taking as needed) 60 tablet 2  . fluticasone (FLONASE) 50 MCG/ACT nasal spray Place 1 spray into both nostrils 2 (two) times daily as needed for allergies or rhinitis. 16 g 5  . hydrochlorothiazide (HYDRODIURIL) 25 MG tablet Take 1 tablet (25 mg total) by mouth daily. (Patient not taking: Reported on 08/30/2020) 90 tablet 3  . montelukast (SINGULAIR) 10 MG  tablet Take 1 tablet (10 mg total) by mouth at bedtime. 30 tablet 5  . Olopatadine HCl 0.2 % SOLN Apply 1 drop to eye daily as needed (itchy/watery eyes). 2.5 mL 5   No current facility-administered medications for this visit.   Allergies: No Known Allergies Social History: Social History   Socioeconomic History  . Marital status: Single    Spouse name: Not on file  . Number of children: Not on file  . Years of education: Not on file  . Highest education level: Not on file  Occupational History  . Not on file  Tobacco Use  . Smoking status: Current Every Day Smoker    Packs/day: 0.50  . Smokeless tobacco: Never Used  Vaping Use  . Vaping Use: Every day  Substance and Sexual Activity  . Alcohol use: Yes    Alcohol/week: 1.0 - 2.0 standard drink    Types: 1 - 2 Glasses of wine per week    Comment: rarely   . Drug use: Yes    Types: Marijuana    Comment: daily  . Sexual activity: Yes  Other Topics Concern  . Not on file  Social History Narrative  . Not on file   Social Determinants of Health   Financial Resource Strain:   . Difficulty of Paying Living Expenses: Not on file  Food Insecurity:   . Worried About Charity fundraiser in the Last Year: Not on file  . Ran Out of Food in the Last Year: Not on file  Transportation Needs:   . Lack of Transportation (Medical): Not on file  . Lack of Transportation (Non-Medical): Not on file  Physical Activity:   . Days of Exercise per Week: Not on file  . Minutes of Exercise per Session: Not on file  Stress:   . Feeling of Stress : Not on file  Social Connections:   . Frequency of Communication with Friends and Family: Not on file  . Frequency of Social Gatherings with Friends and Family: Not on file  . Attends Religious Services: Not on file  . Active Member of Clubs or Organizations: Not on file  . Attends Archivist Meetings: Not on file  . Marital Status: Not on file   Lives in an apartment. 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 Asthma Mother   . Hypertension Mother   . Asthma Brother    Review of Systems  Constitutional: Positive for fatigue. Negative for appetite change, chills, fever and unexpected weight change.  HENT: Negative for congestion and rhinorrhea.   Eyes: Negative for itching.  Respiratory: Positive for cough and shortness of breath. Negative for chest tightness and wheezing.   Cardiovascular: Negative for chest pain.  Gastrointestinal: Negative for abdominal pain.  Genitourinary: Negative for difficulty urinating.  Skin: Positive for  rash.  Allergic/Immunologic: Positive for environmental allergies.  Neurological: Positive for headaches.   Objective: BP 130/90   Pulse 88   Temp (!) 96.4 F (35.8 C) (Temporal)   Resp 16   Ht 5' 6.5" (1.689 m)   Wt 199 lb (90.3 kg)   SpO2 96%   BMI 31.64 kg/m  Body mass index is 31.64 kg/m. Physical Exam Vitals and nursing note reviewed.  Constitutional:      Appearance: Normal appearance. She is well-developed.  HENT:     Head: Normocephalic and atraumatic.     Right Ear: External ear normal.     Left Ear: External ear normal.     Nose: Nose normal.     Mouth/Throat:     Mouth: Mucous membranes are moist.     Pharynx: Oropharynx is clear.  Eyes:     Conjunctiva/sclera: Conjunctivae normal.  Cardiovascular:     Rate and Rhythm: Normal rate and regular rhythm.     Heart sounds: Normal heart sounds. No murmur heard.  No friction rub. No gallop.   Pulmonary:     Effort: Pulmonary effort is normal.     Breath sounds: Normal breath sounds. No wheezing, rhonchi or rales.  Musculoskeletal:     Cervical back: Neck supple.  Skin:    General: Skin is warm.      Findings: No rash.  Neurological:     Mental Status: She is alert and oriented to person, place, and time.  Psychiatric:        Behavior: Behavior normal.    The plan was reviewed with the patient/family, and all questions/concerned were addressed.  It was my pleasure to see Sabrina Larson today and participate in her care. Please feel free to contact me with any questions or concerns.  Sincerely,  Rexene Alberts, DO Allergy & Immunology  [facility] office: [place] office: [phone]

## 2020-08-30 ENCOUNTER — Other Ambulatory Visit: Payer: Self-pay

## 2020-08-30 ENCOUNTER — Encounter: Payer: Self-pay | Admitting: Allergy

## 2020-08-30 ENCOUNTER — Ambulatory Visit (INDEPENDENT_AMBULATORY_CARE_PROVIDER_SITE_OTHER): Payer: Medicaid Other | Admitting: Allergy

## 2020-08-30 VITALS — BP 130/90 | HR 88 | Temp 96.4°F | Resp 16 | Ht 66.5 in | Wt 199.0 lb

## 2020-08-30 DIAGNOSIS — R0602 Shortness of breath: Secondary | ICD-10-CM

## 2020-08-30 DIAGNOSIS — J3089 Other allergic rhinitis: Secondary | ICD-10-CM

## 2020-08-30 DIAGNOSIS — Z720 Tobacco use: Secondary | ICD-10-CM | POA: Diagnosis not present

## 2020-08-30 DIAGNOSIS — H1013 Acute atopic conjunctivitis, bilateral: Secondary | ICD-10-CM | POA: Insufficient documentation

## 2020-08-30 MED ORDER — MONTELUKAST SODIUM 10 MG PO TABS: 10.0000 mg | ORAL_TABLET | Freq: Every day | ORAL | 5 refills | Status: DC

## 2020-08-30 MED ORDER — OLOPATADINE HCL 0.2 % OP SOLN: 1.0000 [drp] | Freq: Every day | OPHTHALMIC | 5 refills | Status: DC | PRN

## 2020-08-30 MED ORDER — FLUTICASONE PROPIONATE 50 MCG/ACT NA SUSP: 1.0000 | Freq: Two times a day (BID) | NASAL | 5 refills | Status: DC | PRN

## 2020-08-30 NOTE — Assessment & Plan Note (Signed)
   See assessment and plan as above for allergic rhinitis.  

## 2020-08-30 NOTE — Patient Instructions (Addendum)
Today's skin testing showed: Positive to grass, weed, trees, mold, dust mites, cat dog, cockroach. Results given.   Environmental allergies  Start environmental control measures as below.  May use over the counter antihistamines such as Zyrtec (cetirizine), Claritin (loratadine), Allegra (fexofenadine), or Xyzal (levocetirizine) daily as needed. May take twice a day if needed. Start Singulair (montelukast) 10mg  daily at night. Cautioned that in some children/adults can experience behavioral changes including hyperactivity, agitation, depression, sleep disturbances and suicidal ideations. These side effects are rare, but if you notice them you should notify me and discontinue Singulair (montelukast).  May use olopatadine eye drops 0.2% once a day as needed for itchy/watery eyes.  May use Flonase (fluticasone) nasal spray 1 spray per nostril twice a day as needed for nasal congestion.   Breathing:  Today's breathing test was normal.   Follow up in 3 months or sooner if needed.   Mold Control . Mold and fungi can grow on a variety of surfaces provided certain temperature and moisture conditions exist.  . Outdoor molds grow on plants, decaying vegetation and soil. The major outdoor mold, Alternaria and Cladosporium, are found in very high numbers during hot and dry conditions. Generally, a late summer - fall peak is seen for common outdoor fungal spores. Rain will temporarily lower outdoor mold spore count, but counts rise rapidly when the rainy period ends. . The most important indoor molds are Aspergillus and Penicillium. Dark, humid and poorly ventilated basements are ideal sites for mold growth. The next most common sites of mold growth are the bathroom and the kitchen. Outdoor (Seasonal) Mold Control . Use air conditioning and keep windows closed. . Avoid exposure to decaying vegetation. Marland Kitchen Avoid leaf raking. . Avoid grain handling. . Consider wearing a face mask if working in moldy  areas.  Indoor (Perennial) Mold Control  . Maintain humidity below 50%. . Get rid of mold growth on hard surfaces with water, detergent and, if necessary, 5% bleach (do not mix with other cleaners). Then dry the area completely. If mold covers an area more than 10 square feet, consider hiring an indoor environmental professional. . For clothing, washing with soap and water is best. If moldy items cannot be cleaned and dried, throw them away. . Remove sources e.g. contaminated carpets. . Repair and seal leaking roofs or pipes. Using dehumidifiers in damp basements may be helpful, but empty the water and clean units regularly to prevent mildew from forming. All rooms, especially basements, bathrooms and kitchens, require ventilation and cleaning to deter mold and mildew growth. Avoid carpeting on concrete or damp floors, and storing items in damp areas. Reducing Pollen Exposure . Pollen seasons: trees (spring), grass (summer) and ragweed/weeds (fall). Marland Kitchen Keep windows closed in your home and car to lower pollen exposure.  Sabrina Larson air conditioning in the bedroom and throughout the house if possible.  . Avoid going out in dry windy days - especially early morning. . Pollen counts are highest between 5 - 10 AM and on dry, hot and windy days.  . Save outside activities for late afternoon or after a heavy rain, when pollen levels are lower.  . Avoid mowing of grass if you have grass pollen allergy. Marland Kitchen Be aware that pollen can also be transported indoors on people and pets.  . Dry your clothes in an automatic dryer rather than hanging them outside where they might collect pollen.  . Rinse hair and eyes before bedtime. Control of House Dust Mite Allergen . Dust mite  allergens are a common trigger of allergy and asthma symptoms. While they can be found throughout the house, these microscopic creatures thrive in warm, humid environments such as bedding, upholstered furniture and carpeting. . Because so much  time is spent in the bedroom, it is essential to reduce mite levels there.  . Encase pillows, mattresses, and box springs in special allergen-proof fabric covers or airtight, zippered plastic covers.  . Bedding should be washed weekly in hot water (130 F) and dried in a hot dryer. Allergen-proof covers are available for comforters and pillows that can't be regularly washed.  Wendee Copp the allergy-proof covers every few months. Minimize clutter in the bedroom. Keep pets out of the bedroom.  Marland Kitchen Keep humidity less than 50% by using a dehumidifier or air conditioning. You can buy a humidity measuring device called a hygrometer to monitor this.  . If possible, replace carpets with hardwood, linoleum, or washable area rugs. If that's not possible, vacuum frequently with a vacuum that has a HEPA filter. . Remove all upholstered furniture and non-washable window drapes from the bedroom. . Remove all non-washable stuffed toys from the bedroom.  Wash stuffed toys weekly. Pet Allergen Avoidance: . Contrary to popular opinion, there are no "hypoallergenic" breeds of dogs or cats. That is because people are not allergic to an animal's hair, but to an allergen found in the animal's saliva, dander (dead skin flakes) or urine. Pet allergy symptoms typically occur within minutes. For some people, symptoms can build up and become most severe 8 to 12 hours after contact with the animal. People with severe allergies can experience reactions in public places if dander has been transported on the pet owners' clothing. Marland Kitchen Keeping an animal outdoors is only a partial solution, since homes with pets in the yard still have higher concentrations of animal allergens. . Before getting a pet, ask your allergist to determine if you are allergic to animals. If your pet is already considered part of your family, try to minimize contact and keep the pet out of the bedroom and other rooms where you spend a great deal of time. . As with dust  mites, vacuum carpets often or replace carpet with a hardwood floor, tile or linoleum. . High-efficiency particulate air (HEPA) cleaners can reduce allergen levels over time. . While dander and saliva are the source of cat and dog allergens, urine is the source of allergens from rabbits, hamsters, mice and [place] pigs; so ask a non-allergic family member to clean the animal's cage. . If you have a pet allergy, talk to your allergist about the potential for allergy immunotherapy (allergy shots). This strategy can often provide long-term relief. Cockroach Allergen Avoidance Cockroaches are often found in the homes of densely populated urban areas, schools or commercial buildings, but these creatures can lurk almost anywhere. This does not mean that you have a dirty house or living area. . Block all areas where roaches can enter the home. This includes crevices, wall cracks and windows.  . Cockroaches need water to survive, so fix and seal all leaky faucets and pipes. Have an exterminator go through the house when your family and pets are gone to eliminate any remaining roaches. Marland Kitchen Keep food in lidded containers and put pet food dishes away after your pets are done eating. Vacuum and sweep the floor after meals, and take out garbage and recyclables. Use lidded garbage containers in the kitchen. Wash dishes immediately after use and clean under stoves, refrigerators or toasters where  crumbs can accumulate. Wipe off the stove and other kitchen surfaces and cupboards regularly.

## 2020-08-30 NOTE — Assessment & Plan Note (Signed)
History of chest tightness, shortness of breath, coughing and wheezing with bronchitis requiring albuterol inhaler. Smokes 7 cigs/day.   Today's spirometry was normal.  Start Singulair 10 mg daily as above.  Monitor symptoms.  Repeat spirometry at next visit.

## 2020-08-30 NOTE — Assessment & Plan Note (Addendum)
Mold issues in current apartment. Water leak was fixed but mold is still present. Concerned if the mold is causing headaches, fatigue, nausea, itchy skin, coughing and shortness of breath. 07/19/2020 immunocap which was positive to several molds.   Today's skin testing showed: Positive to grass, weed, trees, mold, dust mites, cat dog, cockroach. Results given.   Start environmental control measures as below.  May use over the counter antihistamines such as Zyrtec (cetirizine), Claritin (loratadine), Allegra (fexofenadine), or Xyzal (levocetirizine) daily as needed. May take twice a day if needed. Start Singulair (montelukast) 10mg  daily at night. Cautioned that in some children/adults can experience behavioral changes including hyperactivity, agitation, depression, sleep disturbances and suicidal ideations. These side effects are rare, but if you notice them you should notify me and discontinue Singulair (montelukast).  May use olopatadine eye drops 0.2% once a day as needed for itchy/watery eyes.  May use Flonase (fluticasone) nasal spray 1 spray per nostril twice a day as needed for nasal congestion.   Regarding the mold - see information below. ? Use a Dehumidifier during the spring through fall. ? Fix the source of water leakage. ? Wipe off mold with bleach.  ? Best solution is to move out of the current home.

## 2020-11-01 ENCOUNTER — Ambulatory Visit: Payer: Medicaid Other | Admitting: Nurse Practitioner

## 2020-11-22 DIAGNOSIS — N3000 Acute cystitis without hematuria: Secondary | ICD-10-CM | POA: Diagnosis not present

## 2020-11-29 ENCOUNTER — Ambulatory Visit: Payer: Medicaid Other | Admitting: Allergy

## 2020-11-29 DIAGNOSIS — H1010 Acute atopic conjunctivitis, unspecified eye: Secondary | ICD-10-CM | POA: Insufficient documentation

## 2020-11-29 NOTE — Progress Notes (Deleted)
Follow Up Note  RE: Sabrina Larson MRN: [id number] DOB: February 16, 1976 Date of Office Visit: 11/29/2020  Referring provider: Claiborne Rigg, NP Primary care provider: Claiborne Rigg, NP  Chief Complaint: No chief complaint on file.  History of Present Illness: I had the pleasure of seeing Sabrina Larson for a follow up visit at the [facility] on 11/29/2020. She is a 44 y.o. female, who is being followed for allergic rhinoconjunctivitis and shortness of breath. Her previous allergy office visit was on 08/30/2020 with Dr. Selena Batten. Today is a regular follow up visit.  Other allergic rhinitis Mold issues in current apartment. Water leak was fixed but mold is still present. Concerned if the mold is causing headaches, fatigue, nausea, itchy skin, coughing and shortness of breath. 07/19/2020 immunocap which was positive to several molds.   Today's skin testing showed: Positive to grass, weed, trees, mold, dust mites, cat dog, cockroach. Results given.   Start environmental control measures as below.  May use over the counter antihistamines such as Zyrtec (cetirizine), Claritin (loratadine), Allegra (fexofenadine), or Xyzal (levocetirizine) daily as needed. May take twice a day if needed.  Start Singulair (montelukast) 10mg  daily at night.  Cautioned that in some children/adults can experience behavioral changes including hyperactivity, agitation, depression, sleep disturbances and suicidal ideations. These side effects are rare, but if you notice them you should notify me and discontinue Singulair (montelukast).  May use olopatadine eye drops 0.2% once a day as needed for itchy/watery eyes.  May use Flonase (fluticasone) nasal spray 1 spray per nostril twice a day as needed for nasal congestion.   Regarding the mold - see information below. ? Use a Dehumidifier during the spring through fall. ? Fix the source of water leakage. ? Wipe off mold with bleach.   ? Best solution is to move out of the current home.  Allergic conjunctivitis of both eyes  See assessment and plan as above for allergic rhinitis.  Shortness of breath History of chest tightness, shortness of breath, coughing and wheezing with bronchitis requiring albuterol inhaler. Smokes 7 cigs/day.   Today's spirometry was normal.  Start Singulair 10 mg daily as above.  Monitor symptoms.  Repeat spirometry at next visit.   Return in about 3 months (around 11/29/2020).   Assessment and Plan: Sabrina Larson is a 44 y.o. female with: No problem-specific Assessment & Plan notes found for this encounter.  No follow-ups on file.  No orders of the defined types were placed in this encounter.  Lab Orders  No laboratory test(s) ordered today    Diagnostics: Spirometry:  Tracings reviewed. Her effort: {Blank single:19197::"Good reproducible efforts.","It was hard to get consistent efforts and there is a question as to whether this reflects a maximal maneuver.","Poor effort, data can not be interpreted."} FVC: ***L FEV1: ***L, ***% predicted FEV1/FVC ratio: ***% Interpretation: {Blank single:19197::"Spirometry consistent with mild obstructive disease","Spirometry consistent with moderate obstructive disease","Spirometry consistent with severe obstructive disease","Spirometry consistent with possible restrictive disease","Spirometry consistent with mixed obstructive and restrictive disease","Spirometry uninterpretable due to technique","Spirometry consistent with normal pattern","No overt abnormalities noted given today's efforts"}.  Please see scanned spirometry results for details.  Skin Testing: {Blank single:19197::"Select foods","Environmental allergy panel","Environmental allergy panel and select foods","Food allergy panel","None","Deferred due to recent antihistamines use"}. Positive test to: ***. Negative test to: ***.  Results discussed with patient/family.   Medication  List:  Current Outpatient Medications  Medication Sig Dispense Refill  . azelastine (OPTIVAR) 0.05 % ophthalmic solution Place 1 drop into  both eyes 2 (two) times daily. 6 mL 12  . Blood Pressure Monitor DEVI Please provide patient with insurance approved blood pressure monitor ICD10 (I10) 1 each 0  . cetirizine (ZYRTEC) 10 MG tablet Take 1 tablet (10 mg total) by mouth daily. (Patient taking differently: Take 10 mg by mouth daily. Taking as needed) 30 tablet 11  . famotidine (PEPCID) 20 MG tablet Take 1 tablet (20 mg total) by mouth 2 (two) times daily. (Patient taking differently: Take 20 mg by mouth 2 (two) times daily. Taking as needed) 60 tablet 2  . fluticasone (FLONASE) 50 MCG/ACT nasal spray Place 1 spray into both nostrils 2 (two) times daily as needed for allergies or rhinitis. 16 g 5  . hydrochlorothiazide (HYDRODIURIL) 25 MG tablet Take 1 tablet (25 mg total) by mouth daily. (Patient not taking: Reported on 08/30/2020) 90 tablet 3  . montelukast (SINGULAIR) 10 MG tablet Take 1 tablet (10 mg total) by mouth at bedtime. 30 tablet 5  . Olopatadine HCl 0.2 % SOLN Apply 1 drop to eye daily as needed (itchy/watery eyes). 2.5 mL 5   No current facility-administered medications for this visit.   Allergies: No Known Allergies I reviewed her past medical history, social history, family history, and environmental history and no significant changes have been reported from her previous visit.  Review of Systems  Constitutional: Positive for fatigue. Negative for appetite change, chills, fever and unexpected weight change.  HENT: Negative for congestion and rhinorrhea.   Eyes: Negative for itching.  Respiratory: Positive for cough and shortness of breath. Negative for chest tightness and wheezing.   Cardiovascular: Negative for chest pain.  Gastrointestinal: Negative for abdominal pain.  Genitourinary: Negative for difficulty urinating.  Skin: Positive for rash.  Allergic/Immunologic:  Positive for environmental allergies.  Neurological: Positive for headaches.   Objective: There were no vitals taken for this visit. There is no height or weight on file to calculate BMI. Physical Exam Vitals and nursing note reviewed.  Constitutional:      Appearance: Normal appearance. She is well-developed.  HENT:     Head: Normocephalic and atraumatic.     Right Ear: External ear normal.     Left Ear: External ear normal.     Nose: Nose normal.     Mouth/Throat:     Mouth: Mucous membranes are moist.     Pharynx: Oropharynx is clear.  Eyes:     Conjunctiva/sclera: Conjunctivae normal.  Cardiovascular:     Rate and Rhythm: Normal rate and regular rhythm.     Heart sounds: Normal heart sounds. No murmur heard. No friction rub. No gallop.   Pulmonary:     Effort: Pulmonary effort is normal.     Breath sounds: Normal breath sounds. No wheezing, rhonchi or rales.  Musculoskeletal:     Cervical back: Neck supple.  Skin:    General: Skin is warm.     Findings: No rash.  Neurological:     Mental Status: She is alert and oriented to person, place, and time.  Psychiatric:        Behavior: Behavior normal.    Previous notes and tests were reviewed. The plan was reviewed with the patient/family, and all questions/concerned were addressed.  It was my pleasure to see Kaydience today and participate in her care. Please feel free to contact me with any questions or concerns.  Sincerely,  Rexene Alberts, DO Allergy & Immunology  [facility] office: [place]  office: [phone]

## 2020-12-16 ENCOUNTER — Other Ambulatory Visit: Payer: Self-pay | Admitting: Nurse Practitioner

## 2020-12-16 DIAGNOSIS — Z1231 Encounter for screening mammogram for malignant neoplasm of breast: Secondary | ICD-10-CM

## 2021-01-30 ENCOUNTER — Ambulatory Visit
Admission: RE | Admit: 2021-01-30 | Discharge: 2021-01-30 | Disposition: A | Discharge status: Home / Self Care | Payer: Medicaid Other | Source: Ambulatory Visit | Attending: Nurse Practitioner | Admitting: Nurse Practitioner

## 2021-01-30 ENCOUNTER — Other Ambulatory Visit: Payer: Self-pay

## 2021-01-30 DIAGNOSIS — Z1231 Encounter for screening mammogram for malignant neoplasm of breast: Secondary | ICD-10-CM | POA: Diagnosis not present

## 2021-02-14 ENCOUNTER — Other Ambulatory Visit: Payer: Self-pay | Admitting: Nurse Practitioner

## 2021-02-14 DIAGNOSIS — I10 Essential (primary) hypertension: Secondary | ICD-10-CM

## 2021-02-14 NOTE — Telephone Encounter (Signed)
Requested medications are due for refill today yes  Requested medications are on the active medication list yes  Last refill 11/19/20  Last visit 07/2020  Future visit scheduled No  Notes to clinic Was to return in Nov 2021, no visit scheduled. Stated to pharm  on 08/2020 that she does not take but then did fill in Dec 2021,

## 2021-05-13 NOTE — Telephone Encounter (Signed)
Error

## 2021-07-10 ENCOUNTER — Ambulatory Visit (INDEPENDENT_AMBULATORY_CARE_PROVIDER_SITE_OTHER): Payer: Medicaid Other

## 2021-07-10 ENCOUNTER — Encounter: Payer: Self-pay | Admitting: Family Medicine

## 2021-07-10 ENCOUNTER — Other Ambulatory Visit: Payer: Self-pay

## 2021-07-10 ENCOUNTER — Ambulatory Visit (INDEPENDENT_AMBULATORY_CARE_PROVIDER_SITE_OTHER): Payer: Medicaid Other | Admitting: Family Medicine

## 2021-07-10 VITALS — BP 132/88 | HR 99 | Resp 16 | Wt 195.0 lb

## 2021-07-10 DIAGNOSIS — N3001 Acute cystitis with hematuria: Secondary | ICD-10-CM

## 2021-07-10 DIAGNOSIS — M25562 Pain in left knee: Secondary | ICD-10-CM

## 2021-07-10 DIAGNOSIS — G8929 Other chronic pain: Secondary | ICD-10-CM | POA: Diagnosis not present

## 2021-07-10 LAB — POCT URINALYSIS DIP (CLINITEK)
Bilirubin, UA: NEGATIVE
Glucose, UA: NEGATIVE mg/dL
Ketones, POC UA: NEGATIVE mg/dL
Nitrite, UA: NEGATIVE
POC PROTEIN,UA: 30 — AB
Spec Grav, UA: 1.02 (ref 1.010–1.025)
Urobilinogen, UA: 1 E.U./dL
pH, UA: 7 (ref 5.0–8.0)

## 2021-07-10 MED ORDER — NITROFURANTOIN MONOHYD MACRO 100 MG PO CAPS: 100.0000 mg | ORAL_CAPSULE | Freq: Two times a day (BID) | ORAL | 0 refills | Status: DC

## 2021-07-10 NOTE — Progress Notes (Signed)
Acute Office Visit  Subjective:    Patient ID: Sabrina Larson, female    DOB: [birth year], 45 y.o.   MRN: [id number]  Chief Complaint  Patient presents with   Urinary Tract Infection    HPI Patient is in today for complaint of burning with urination for several weeks. She reports recurrent issues of UTI. She denies fever/chills. She also reports chronic left knee pain. She reports that she usually has issues with her knee more when her weight increases. She denies known trauma or injury.   Past Medical History:  Diagnosis Date   GERD (gastroesophageal reflux disease)    Hypertension    Prediabetes     Past Surgical History:  Procedure Laterality Date   CESAREAN SECTION     2014, 1995   DILATION AND CURETTAGE OF UTERUS     8 total in office and outpatient   SKIN GRAFT     at age 45    Family History  Problem Relation Age of Onset   Asthma Mother    Hypertension Mother    Asthma Brother     Social History   Socioeconomic History   Marital status: Single    Spouse name: Not on file   Number of children: Not on file   Years of education: Not on file   Highest education level: Not on file  Occupational History   Not on file  Tobacco Use   Smoking status: Every Day    Packs/day: 0.50    Types: Cigarettes   Smokeless tobacco: Never  Vaping Use   Vaping Use: Every day  Substance and Sexual Activity   Alcohol use: Yes    Alcohol/week: 1.0 - 2.0 standard drink    Types: 1 - 2 Glasses of wine per week    Comment: rarely    Drug use: Yes    Types: Marijuana    Comment: daily   Sexual activity: Yes  Other Topics Concern   Not on file  Social History Narrative   Not on file   Social Determinants of Health   Financial Resource Strain: Not on file  Food Insecurity: Not on file  Transportation Needs: Not on file  Physical Activity: Not on file  Stress: Not on file  Social Connections: Not on file  Intimate Partner Violence: Not on file     Outpatient Medications Prior to Visit  Medication Sig Dispense Refill   azelastine (OPTIVAR) 0.05 % ophthalmic solution Place 1 drop into both eyes 2 (two) times daily. (Patient not taking: Reported on 07/10/2021) 6 mL 12   Blood Pressure Monitor DEVI Please provide patient with insurance approved blood pressure monitor ICD10 (I10) (Patient not taking: Reported on 07/10/2021) 1 each 0   cetirizine (ZYRTEC) 10 MG tablet Take 1 tablet (10 mg total) by mouth daily. (Patient not taking: Reported on 07/10/2021) 30 tablet 11   famotidine (PEPCID) 20 MG tablet Take 1 tablet (20 mg total) by mouth 2 (two) times daily. (Patient not taking: Reported on 07/10/2021) 60 tablet 2   fluticasone (FLONASE) 50 MCG/ACT nasal spray Place 1 spray into both nostrils 2 (two) times daily as needed for allergies or rhinitis. (Patient not taking: Reported on 07/10/2021) 16 g 5   hydrochlorothiazide (HYDRODIURIL) 25 MG tablet Take 1 tablet (25 mg total) by mouth daily. (Patient not taking: No sig reported) 90 tablet 3   montelukast (SINGULAIR) 10 MG tablet Take 1 tablet (10 mg total) by mouth at bedtime. (Patient not taking: Reported  on 07/10/2021) 30 tablet 5   Olopatadine HCl 0.2 % SOLN Apply 1 drop to eye daily as needed (itchy/watery eyes). (Patient not taking: Reported on 07/10/2021) 2.5 mL 5   No facility-administered medications prior to visit.    No Known Allergies  Review of Systems  Genitourinary:  Positive for dysuria.  All other systems reviewed and are negative.     Objective:    Physical Exam Vitals and nursing note reviewed.  Constitutional:      General: She is not in acute distress. Cardiovascular:     Rate and Rhythm: Normal rate and regular rhythm.     Pulses: Normal pulses.     Heart sounds: Normal heart sounds.  Pulmonary:     Breath sounds: Normal breath sounds.  Abdominal:     Palpations: Abdomen is soft.     Tenderness: There is abdominal tenderness in the suprapubic area.  Musculoskeletal:      Left knee: No swelling or deformity. Normal range of motion. Tenderness present.  Neurological:     Mental Status: She is alert.    BP 132/88   Pulse 99   Resp 16   Wt 195 lb (88.5 kg)   SpO2 97%   BMI 31.00 kg/m  Wt Readings from Last 3 Encounters:  07/10/21 195 lb (88.5 kg)  08/30/20 199 lb (90.3 kg)  07/26/20 203 lb (92.1 kg)    Health Maintenance Due  Topic Date Due   COVID-19 Vaccine (1) Never done   Pneumococcal Vaccine 36-59 Years old (2 - PCV) 02/25/2018   INFLUENZA VACCINE  07/03/2021    There are no preventive care reminders to display for this patient.   Lab Results  Component Value Date   TSH 0.31 (L) 04/17/2016   Lab Results  Component Value Date   WBC 7.9 05/25/2020   HGB 13.6 05/25/2020   HCT 39.9 05/25/2020   MCV 80 05/25/2020   PLT 408 05/25/2020   Lab Results  Component Value Date   NA 142 07/19/2020   K 4.0 07/19/2020   CO2 24 07/19/2020   GLUCOSE 109 (H) 07/19/2020   BUN 9 07/19/2020   CREATININE 0.63 07/19/2020   BILITOT 0.3 11/24/2019   ALKPHOS 101 11/24/2019   AST 13 11/24/2019   ALT 12 11/24/2019   PROT 6.4 11/24/2019   ALBUMIN 3.7 (L) 11/24/2019   CALCIUM 9.5 07/19/2020   ANIONGAP 7 03/19/2019   Lab Results  Component Value Date   CHOL 171 11/24/2019   Lab Results  Component Value Date   HDL 42 11/24/2019   Lab Results  Component Value Date   LDLCALC 117 (H) 11/24/2019   Lab Results  Component Value Date   TRIG 60 11/24/2019   Lab Results  Component Value Date   CHOLHDL 4.1 11/24/2019   Lab Results  Component Value Date   HGBA1C 6.0 (H) 05/25/2020       Assessment & Plan:   Problem List Items Addressed This Visit   None Visit Diagnoses     Acute cystitis with hematuria    -  Primary   urine for culture. macrobid prescribed. referral to urology for further eval/mgt   Relevant Orders   POCT URINALYSIS DIP (CLINITEK) (Completed)   Urine Culture   Ambulatory referral to Urology   Chronic pain of  left knee       xray ordered - tylenol/nsaids prn - consider referral pending results   Relevant Orders   DG Knee 1-2 Views Left  No orders of the defined types were placed in this encounter.    Becky Sax, MD

## 2021-07-13 ENCOUNTER — Other Ambulatory Visit: Payer: Self-pay | Admitting: Family Medicine

## 2021-07-13 LAB — URINE CULTURE

## 2021-07-13 MED ORDER — SULFAMETHOXAZOLE-TRIMETHOPRIM 800-160 MG PO TABS: 1.0000 | ORAL_TABLET | Freq: Two times a day (BID) | ORAL | 0 refills | Status: DC

## 2021-11-28 ENCOUNTER — Ambulatory Visit: Payer: Self-pay

## 2021-11-28 NOTE — Telephone Encounter (Signed)
Chief Complaint: pain and bleeding after intercourse Symptoms: pain and bleeding after intercourse Frequency: several months Pertinent Negatives: Patient denies pain with urination Disposition: [] ED /[] Urgent Care (no appt availability in office) / [x] Appointment(In office/virtual)/ []  [place] [facility]/ [] Home Care/ [] Refused Recommended Disposition  Additional Notes: Pt states this issue has been going on for several months. The pain stays constant after intercourse for several days and has spotting as well. Scheduled pt appt for 11/30/21 at 1110 with Dr. Margarita Rana. Care advice given and pt verbalized understanding. No other questions/concerns noted.     Reason for Disposition  Pain with sexual intercourse (dyspareunia)  (Exception: feels like prior yeast infection, minor abrasion, minor rash < 24 hour duration, mild itching)  Answer Assessment - Initial Assessment Questions 1. SYMPTOM: "What's the main symptom you're concerned about?" (e.g., pain, itching, dryness)     Pain and bleeding after intercourse 2. LOCATION: "Where is the  sx located?" (e.g., inside/outside, left/right)     vaginal 3. ONSET: "When did the  sx  start?"     Few months 4. PAIN: "Is there any pain?" If Yes, ask: "How bad is it?" (Scale: 1-10; mild, moderate, severe)     5-8  7. OTHER SYMPTOMS: "Do you have any other symptoms?" (e.g., fever, itching, vaginal bleeding, pain with urination, injury to genital area, vaginal foreign body)     Spotting  Protocols used: Vaginal Symptoms-A-AH

## 2021-11-30 ENCOUNTER — Ambulatory Visit: Payer: Medicaid Other | Admitting: Family Medicine

## 2022-01-17 ENCOUNTER — Encounter: Payer: Self-pay | Admitting: Nurse Practitioner

## 2022-01-17 ENCOUNTER — Ambulatory Visit: Payer: Medicaid Other | Attending: Nurse Practitioner | Admitting: Nurse Practitioner

## 2022-01-17 ENCOUNTER — Other Ambulatory Visit: Payer: Self-pay

## 2022-01-17 VITALS — BP 126/88 | HR 98 | Ht 66.5 in | Wt 192.0 lb

## 2022-01-17 DIAGNOSIS — E559 Vitamin D deficiency, unspecified: Secondary | ICD-10-CM | POA: Diagnosis not present

## 2022-01-17 DIAGNOSIS — R7989 Other specified abnormal findings of blood chemistry: Secondary | ICD-10-CM | POA: Diagnosis not present

## 2022-01-17 DIAGNOSIS — N941 Unspecified dyspareunia: Secondary | ICD-10-CM | POA: Diagnosis not present

## 2022-01-17 DIAGNOSIS — Z1211 Encounter for screening for malignant neoplasm of colon: Secondary | ICD-10-CM

## 2022-01-17 DIAGNOSIS — N920 Excessive and frequent menstruation with regular cycle: Secondary | ICD-10-CM

## 2022-01-17 DIAGNOSIS — E785 Hyperlipidemia, unspecified: Secondary | ICD-10-CM

## 2022-01-17 DIAGNOSIS — Z0000 Encounter for general adult medical examination without abnormal findings: Secondary | ICD-10-CM

## 2022-01-17 DIAGNOSIS — I10 Essential (primary) hypertension: Secondary | ICD-10-CM

## 2022-01-17 DIAGNOSIS — R7303 Prediabetes: Secondary | ICD-10-CM

## 2022-01-17 DIAGNOSIS — D649 Anemia, unspecified: Secondary | ICD-10-CM

## 2022-01-17 MED ORDER — HYDROCHLOROTHIAZIDE 25 MG PO TABS: 25.0000 mg | ORAL_TABLET | Freq: Every day | ORAL | 3 refills | Status: DC

## 2022-01-17 NOTE — Progress Notes (Signed)
Assessment & Plan:  Kaegan was seen today for annual exam.  Diagnoses and all orders for this visit:  Annual physical exam  Primary hypertension -     CMP14+EGFR -     hydrochlorothiazide (HYDRODIURIL) 25 MG tablet; Take 1 tablet (25 mg total) by mouth daily.  Prediabetes -     HgB A1c  Colon cancer screening -     Ambulatory referral to Gastroenterology  Dyspareunia, female -     US PELVIC COMPLETE WITH TRANSVAGINAL; Future -     Ambulatory referral to Gynecology  Spotting -     US PELVIC COMPLETE WITH TRANSVAGINAL; Future -     Ambulatory referral to Gynecology  Anemia, unspecified type -     CBC -     Thyroid Panel With TSH  Vitamin D deficiency disease -     VITAMIN D 25 Hydroxy (Vit-D Deficiency, Fractures)  Dyslipidemia, goal LDL below 100 -     Lipid panel  Abnormal TSH -     Thyroid Panel With TSH    Patient has been counseled on age-appropriate routine health concerns for screening and prevention. These are reviewed and up-to-date. Referrals have been placed accordingly. Immunizations are up-to-date or declined.    Subjective:   Chief Complaint  Patient presents with   Annual Exam   HPI Luticia Tadros Gimbel 46 y.o. female presents to office today for annual physical exam.   She notes dyspareunia on the left side of her pelvic area as well as spotting after sex. She is still having monthly menstrual cycles although shorter in duration.   Currently working on weight loss by incorporating intermittent fasting for her meal plans.   HTN Blood pressure is not optimal. Will refill HCTZ 25 mg today. We discussed weight loss and how it contributes to lowering blood pressure.  BP Readings from Last 3 Encounters:  01/17/22 126/88  07/10/21 132/88  08/30/20 130/90    Last thyroid level abnormal. Will repeat today.  Lab Results  Component Value Date   TSH 0.31 (L) 04/17/2016     Review of Systems  Constitutional:  Negative for fever,  malaise/fatigue and weight loss.  HENT: Negative.  Negative for nosebleeds.   Eyes: Negative.  Negative for blurred vision, double vision and photophobia.  Respiratory: Negative.  Negative for cough and shortness of breath.   Cardiovascular: Negative.  Negative for chest pain, palpitations and leg swelling.  Gastrointestinal: Negative.  Negative for heartburn, nausea and vomiting.  Genitourinary:        SEE HPI  Musculoskeletal: Negative.  Negative for myalgias.  Skin: Negative.   Neurological: Negative.  Negative for dizziness, focal weakness, seizures and headaches.  Endo/Heme/Allergies: Negative.   Psychiatric/Behavioral: Negative.  Negative for suicidal ideas.    Past Medical History:  Diagnosis Date   GERD (gastroesophageal reflux disease)    Hypertension    Prediabetes     Past Surgical History:  Procedure Laterality Date   CESAREAN SECTION     2014, 1995   DILATION AND CURETTAGE OF UTERUS     8 total in office and outpatient   SKIN GRAFT     at age 64    Family History  Problem Relation Age of Onset   Asthma Mother    Hypertension Mother    Asthma Brother     Social History Reviewed with no changes to be made today.   Outpatient Medications Prior to Visit  Medication Sig Dispense Refill   azelastine (OPTIVAR)  0.05 % ophthalmic solution Place 1 drop into both eyes 2 (two) times daily. 6 mL 12   Blood Pressure Monitor DEVI Please provide patient with insurance approved blood pressure monitor ICD10 (I10) 1 each 0   Olopatadine HCl 0.2 % SOLN Apply 1 drop to eye daily as needed (itchy/watery eyes). 2.5 mL 5   hydrochlorothiazide (HYDRODIURIL) 25 MG tablet Take 1 tablet (25 mg total) by mouth daily. 90 tablet 3   cetirizine (ZYRTEC) 10 MG tablet Take 1 tablet (10 mg total) by mouth daily. (Patient not taking: Reported on 07/10/2021) 30 tablet 11   famotidine (PEPCID) 20 MG tablet Take 1 tablet (20 mg total) by mouth 2 (two) times daily. (Patient not taking: Reported on  07/10/2021) 60 tablet 2   fluticasone (FLONASE) 50 MCG/ACT nasal spray Place 1 spray into both nostrils 2 (two) times daily as needed for allergies or rhinitis. (Patient not taking: Reported on 07/10/2021) 16 g 5   montelukast (SINGULAIR) 10 MG tablet Take 1 tablet (10 mg total) by mouth at bedtime. (Patient not taking: Reported on 07/10/2021) 30 tablet 5   nitrofurantoin, macrocrystal-monohydrate, (MACROBID) 100 MG capsule Take 1 capsule (100 mg total) by mouth 2 (two) times daily. (Patient not taking: Reported on 01/17/2022) 10 capsule 0   sulfamethoxazole-trimethoprim (BACTRIM DS) 800-160 MG tablet Take 1 tablet by mouth 2 (two) times daily. (Patient not taking: Reported on 01/17/2022) 6 tablet 0   No facility-administered medications prior to visit.    No Known Allergies     Objective:    BP 126/88    Pulse 98    Ht 5' 6.5" (1.689 m)    Wt 192 lb (87.1 kg)    SpO2 97%    BMI 30.53 kg/m  Wt Readings from Last 3 Encounters:  01/17/22 192 lb (87.1 kg)  07/10/21 195 lb (88.5 kg)  08/30/20 199 lb (90.3 kg)    Physical Exam Constitutional:      Appearance: She is well-developed.  HENT:     Head: Normocephalic and atraumatic.     Right Ear: Hearing, tympanic membrane, ear canal and external ear normal.     Left Ear: Hearing, tympanic membrane, ear canal and external ear normal.     Nose: Nose normal.     Right Turbinates: Not enlarged.     Left Turbinates: Not enlarged.     Mouth/Throat:     Lips: Pink.     Mouth: Mucous membranes are moist.     Dentition: No dental tenderness, gingival swelling, dental abscesses or gum lesions.     Pharynx: No oropharyngeal exudate.  Eyes:     General: No scleral icterus.       Right eye: No discharge.     Extraocular Movements: Extraocular movements intact.     Conjunctiva/sclera: Conjunctivae normal.     Pupils: Pupils are equal, round, and reactive to light.  Neck:     Thyroid: No thyromegaly.     Trachea: No tracheal deviation.   Cardiovascular:     Rate and Rhythm: Normal rate and regular rhythm.     Heart sounds: Normal heart sounds. No murmur heard.   No friction rub.  Pulmonary:     Effort: Pulmonary effort is normal. No accessory muscle usage or respiratory distress.     Breath sounds: Normal breath sounds. No decreased breath sounds, wheezing, rhonchi or rales.  Abdominal:     General: Bowel sounds are normal. There is no distension.     Palpations: Abdomen  is soft. There is no mass.     Tenderness: There is no abdominal tenderness. There is no right CVA tenderness, left CVA tenderness, guarding or rebound.     Hernia: No hernia is present.  Musculoskeletal:        General: No tenderness or deformity. Normal range of motion.     Cervical back: Normal range of motion and neck supple.  Lymphadenopathy:     Cervical: No cervical adenopathy.  Skin:    General: Skin is warm and dry.     Findings: No erythema.  Neurological:     Mental Status: She is alert and oriented to person, place, and time.     Cranial Nerves: No cranial nerve deficit.     Motor: Motor function is intact.     Coordination: Coordination is intact. Coordination normal.     Gait: Gait is intact.     Deep Tendon Reflexes:     Reflex Scores:      Patellar reflexes are 1+ on the right side and 1+ on the left side. Psychiatric:        Attention and Perception: Attention normal.        Mood and Affect: Mood normal.        Speech: Speech normal.        Behavior: Behavior normal.        Thought Content: Thought content normal.        Judgment: Judgment normal.         Patient has been counseled extensively about nutrition and exercise as well as the importance of adherence with medications and regular follow-up. The patient was given clear instructions to go to ER or return to medical center if symptoms don't improve, worsen or new problems develop. The patient verbalized understanding.   Follow-up: Return in about 6 months (around  07/17/2022) for HTN.   Gildardo Pounds, FNP-BC [facility] and [facility], [place]   01/17/2022, 4:18 PM

## 2022-01-18 ENCOUNTER — Other Ambulatory Visit: Payer: Self-pay | Admitting: Nurse Practitioner

## 2022-01-18 LAB — CMP14+EGFR
ALT: 13 IU/L (ref 0–32)
AST: 19 IU/L (ref 0–40)
Albumin/Globulin Ratio: 1.6 (ref 1.2–2.2)
Albumin: 4.4 g/dL (ref 3.8–4.8)
Alkaline Phosphatase: 81 IU/L (ref 44–121)
BUN/Creatinine Ratio: 15 (ref 9–23)
BUN: 11 mg/dL (ref 6–24)
Bilirubin Total: 0.5 mg/dL (ref 0.0–1.2)
CO2: 23 mmol/L (ref 20–29)
Calcium: 10.3 mg/dL — ABNORMAL HIGH (ref 8.7–10.2)
Chloride: 99 mmol/L (ref 96–106)
Creatinine, Ser: 0.74 mg/dL (ref 0.57–1.00)
Globulin, Total: 2.8 g/dL (ref 1.5–4.5)
Glucose: 78 mg/dL (ref 70–99)
Potassium: 3.8 mmol/L (ref 3.5–5.2)
Sodium: 140 mmol/L (ref 134–144)
Total Protein: 7.2 g/dL (ref 6.0–8.5)
eGFR: 102 mL/min/{1.73_m2} (ref >59)

## 2022-01-18 LAB — CBC
Hematocrit: 41 % (ref 34.0–46.6)
Hemoglobin: 14.3 g/dL (ref 11.1–15.9)
MCH: 27.9 pg (ref 26.6–33.0)
MCHC: 34.9 g/dL (ref 31.5–35.7)
MCV: 80 fL (ref 79–97)
Platelets: 412 10*3/uL (ref 150–450)
RBC: 5.13 x10E6/uL (ref 3.77–5.28)
RDW: 13.1 % (ref 11.7–15.4)
WBC: 7.9 10*3/uL (ref 3.4–10.8)

## 2022-01-18 LAB — LIPID PANEL
Chol/HDL Ratio: 4.4 ratio (ref 0.0–4.4)
Cholesterol, Total: 186 mg/dL (ref 100–199)
HDL: 42 mg/dL (ref >39)
LDL Chol Calc (NIH): 131 mg/dL — ABNORMAL HIGH (ref 0–99)
Triglycerides: 67 mg/dL (ref 0–149)
VLDL Cholesterol Cal: 13 mg/dL (ref 5–40)

## 2022-01-18 LAB — VITAMIN D 25 HYDROXY (VIT D DEFICIENCY, FRACTURES): Vit D, 25-Hydroxy: 11.2 ng/mL — ABNORMAL LOW (ref 30.0–100.0)

## 2022-01-18 LAB — THYROID PANEL WITH TSH
Free Thyroxine Index: 2 (ref 1.2–4.9)
T3 Uptake Ratio: 26 % (ref 24–39)
T4, Total: 7.8 ug/dL (ref 4.5–12.0)
TSH: 0.62 u[IU]/mL (ref 0.450–4.500)

## 2022-01-18 MED ORDER — VITAMIN D (ERGOCALCIFEROL) 1.25 MG (50000 UNIT) PO CAPS: 50000.0000 [IU] | ORAL_CAPSULE | ORAL | 1 refills | Status: DC

## 2022-01-20 LAB — HEMOGLOBIN A1C
Est. average glucose Bld gHb Est-mCnc: 117 mg/dL
Hgb A1c MFr Bld: 5.7 % — ABNORMAL HIGH (ref 4.8–5.6)

## 2022-01-20 LAB — SPECIMEN STATUS REPORT

## 2022-01-25 ENCOUNTER — Other Ambulatory Visit: Payer: Self-pay

## 2022-01-25 ENCOUNTER — Ambulatory Visit (HOSPITAL_COMMUNITY)
Admission: RE | Admit: 2022-01-25 | Discharge: 2022-01-25 | Disposition: A | Discharge status: Home / Self Care | Payer: Medicaid Other | Source: Ambulatory Visit | Attending: Nurse Practitioner | Admitting: Nurse Practitioner

## 2022-01-25 DIAGNOSIS — N920 Excessive and frequent menstruation with regular cycle: Secondary | ICD-10-CM | POA: Diagnosis not present

## 2022-01-25 DIAGNOSIS — N941 Unspecified dyspareunia: Secondary | ICD-10-CM | POA: Diagnosis not present

## 2022-01-25 DIAGNOSIS — D259 Leiomyoma of uterus, unspecified: Secondary | ICD-10-CM | POA: Diagnosis not present

## 2022-01-31 ENCOUNTER — Other Ambulatory Visit: Payer: Self-pay | Admitting: Nurse Practitioner

## 2022-01-31 DIAGNOSIS — N941 Unspecified dyspareunia: Secondary | ICD-10-CM

## 2022-01-31 DIAGNOSIS — N920 Excessive and frequent menstruation with regular cycle: Secondary | ICD-10-CM

## 2022-03-29 DIAGNOSIS — H5213 Myopia, bilateral: Secondary | ICD-10-CM | POA: Diagnosis not present

## 2022-04-12 ENCOUNTER — Encounter: Payer: Self-pay | Admitting: Gastroenterology

## 2022-04-12 ENCOUNTER — Ambulatory Visit: Payer: Self-pay | Admitting: *Deleted

## 2022-04-12 ENCOUNTER — Telehealth: Payer: Medicaid Other | Admitting: Family Medicine

## 2022-04-12 DIAGNOSIS — R300 Dysuria: Secondary | ICD-10-CM | POA: Diagnosis not present

## 2022-04-12 DIAGNOSIS — I10 Essential (primary) hypertension: Secondary | ICD-10-CM

## 2022-04-12 MED ORDER — NITROFURANTOIN MONOHYD MACRO 100 MG PO CAPS: 100.0000 mg | ORAL_CAPSULE | Freq: Two times a day (BID) | ORAL | 0 refills | Status: AC

## 2022-04-12 NOTE — Progress Notes (Signed)
?Virtual Visit Consent  ? ?[facility], you are scheduled for a virtual visit with a [facility] provider today. Just as with appointments in the office, your consent must be obtained to participate. Your consent will be active for this visit and any virtual visit you may have with one of our providers in the next 365 days. If you have a MyChart account, a copy of this consent can be sent to you electronically. ? ?As this is a virtual visit, video technology does not allow for your provider to perform a traditional examination. This may limit your provider's ability to fully assess your condition. If your provider identifies any concerns that need to be evaluated in person or the need to arrange testing (such as labs, EKG, etc.), we will make arrangements to do so. Although advances in technology are sophisticated, we cannot ensure that it will always work on either your end or our end. If the connection with a video visit is poor, the visit may have to be switched to a telephone visit. With either a video or telephone visit, we are not always able to ensure that we have a secure connection. ? ?By engaging in this virtual visit, you consent to the provision of healthcare and authorize for your insurance to be billed (if applicable) for the services provided during this visit. Depending on your insurance coverage, you may receive a charge related to this service. ? ?I need to obtain your verbal consent now. Are you willing to proceed with your visit today? Sabrina Larson has provided verbal consent on 04/12/2022 for a virtual visit (video or telephone). Perlie Mayo, NP ? ?Date: 04/12/2022 12:48 PM ? ?Virtual Visit via Video Note  ? ?IPerlie Mayo, connected with  Sabrina Larson  ([id number], 1975/12/23) on 04/12/22 at 12:45 PM EDT by a video-enabled telemedicine application and verified that I am speaking with the correct person using two identifiers. ? ?Location: ?Patient:  Virtual Visit Location Patient: Home ?Provider: Virtual Visit Location Provider: Home Office ?  ?I discussed the limitations of evaluation and management by telemedicine and the availability of in person appointments. The patient expressed understanding and agreed to proceed.   ? ?History of Present Illness: ?Sabrina Larson is a 46 y.o. who identifies as a female who was assigned female at birth, and is being seen today for UTI . ? ?HPI: Urinary Tract Infection  ?This is a new problem. The current episode started yesterday. The problem occurs intermittently. The problem has been gradually worsening. The quality of the pain is described as burning. The pain is at a severity of 7/10. The pain is moderate. There has been no fever. She is Sexually active. There is A history of pyelonephritis. Associated symptoms include frequency, hesitancy and urgency. Pertinent negatives include no chills, discharge, flank pain, hematuria, nausea, possible pregnancy or vomiting. She has tried nothing for the symptoms. The treatment provided no relief. Her past medical history is significant for recurrent UTIs.   ?Problems:  ?Patient Active Problem List  ? Diagnosis Date Noted  ? Seasonal and perennial allergic rhinoconjunctivitis 11/29/2020  ? Other allergic rhinitis 08/30/2020  ? Tobacco user 08/30/2020  ? Shortness of breath 08/30/2020  ? Allergic conjunctivitis of both eyes 08/30/2020  ? 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 Ectopic pregnancy 03/19/2019  ? History of gastric ulcer 12/24/2017  ? GERD (gastroesophageal reflux disease) 01/22/2017  ? Acute conjunctivitis of both eyes 01/22/2017  ? Hypertension 07/05/2016  ?  Prediabetes 05/02/2016  ?  ?Allergies: No Known Allergies ?Medications:  ?Current Outpatient Medications:  ?  azelastine (OPTIVAR) 0.05 % ophthalmic solution, Place 1 drop into both eyes 2 (two) times daily., Disp: 6 mL, Rfl: 12 ?  Blood Pressure Monitor DEVI, Please provide patient with  insurance approved blood pressure monitor ICD10 (I10), Disp: 1 each, Rfl: 0 ?  hydrochlorothiazide (HYDRODIURIL) 25 MG tablet, Take 1 tablet (25 mg total) by mouth daily., Disp: 90 tablet, Rfl: 3 ?  Olopatadine HCl 0.2 % SOLN, Apply 1 drop to eye daily as needed (itchy/watery eyes)., Disp: 2.5 mL, Rfl: 5 ?  Vitamin D, Ergocalciferol, (DRISDOL) 1.25 MG (50000 UNIT) CAPS capsule, Take 1 capsule (50,000 Units total) by mouth every 7 (seven) days., Disp: 12 capsule, Rfl: 1 ? ?Observations/Objective: ?Patient is well-developed, well-nourished in no acute distress.  ?Resting comfortably  at home.  ?Head is normocephalic, atraumatic.  ?No labored breathing.  ?Speech is clear and coherent with logical content.  ?Patient is alert and oriented at baseline.  ? ? ?Assessment and Plan: ?1. Dysuria ? ?- nitrofurantoin, macrocrystal-monohydrate, (MACROBID) 100 MG capsule; Take 1 capsule (100 mg total) by mouth 2 (two) times daily for 5 days.  Dispense: 10 capsule; Refill: 0 ? ? ?-frequent UTIs and Hx of kidney infection -reports not the same sensation, wants to get ahead  ?-no red flags today at visit ?-will try macrobid and strict ED precautions and f/u with PCP if not improved. ?-info on prevention discussed and on avs ? ?2. Primary hypertension ?Reports taking medications as directed.  ? ? ?Follow Up Instructions: ?I discussed the assessment and treatment plan with the patient. The patient was provided an opportunity to ask questions and all were answered. The patient agreed with the plan and demonstrated an understanding of the instructions.  A copy of instructions were sent to the patient via MyChart unless otherwise noted below.  ? ? ? ?The patient was advised to call back or seek an in-person evaluation if the symptoms worsen or if the condition fails to improve as anticipated. ? ?Time:  ?I spent 15 minutes with the patient via telehealth technology discussing the above problems/concerns.   ? ?Perlie Mayo, NP ? ?

## 2022-04-12 NOTE — Telephone Encounter (Signed)
?  Chief Complaint: frequency with urination ?Symptoms: frequency, odor, pain ?Frequency: 2-3 days ?Pertinent Negatives: Patient denies fever ?Disposition: '[]'$ ED /'[]'$ Urgent Care (no appt availability in office) / '[]'$ Appointment(In office/virtual)/ '[x]'$  Between Virtual Care/ '[]'$ Home Care/ '[]'$ Refused Recommended Disposition /'[]'$ [facility]/ '[]'$  Follow-up with PCP ?Additional Notes: No office appointment available- UC/virtual care scheduled    ?

## 2022-04-12 NOTE — Patient Instructions (Signed)
Urinary Tract Infection, Adult A urinary tract infection (UTI) is an infection of any part of the urinary tract. The urinary tract includes: The kidneys. The ureters. The bladder. The urethra. These organs make, store, and get rid of pee (urine) in the body. What are the causes? This infection is caused by germs (bacteria) in your genital area. These germs grow and cause swelling (inflammation) of your urinary tract. What increases the risk? The following factors may make you more likely to develop this condition: Using a small, thin tube (catheter) to drain pee. Not being able to control when you pee or poop (incontinence). Being female. If you are female, these things can increase the risk: Using these methods to prevent pregnancy: A medicine that kills sperm (spermicide). A device that blocks sperm (diaphragm). Having low levels of a female hormone (estrogen). Being pregnant. You are more likely to develop this condition if: You have genes that add to your risk. You are sexually active. You take antibiotic medicines. You have trouble peeing because of: A prostate that is bigger than normal, if you are female. A blockage in the part of your body that drains pee from the bladder. A kidney stone. A nerve condition that affects your bladder. Not getting enough to drink. Not peeing often enough. You have other conditions, such as: Diabetes. A weak disease-fighting system (immune system). Sickle cell disease. Gout. Injury of the spine. What are the signs or symptoms? Symptoms of this condition include: Needing to pee right away. Peeing small amounts often. Pain or burning when peeing. Blood in the pee. Pee that smells bad or not like normal. Trouble peeing. Pee that is cloudy. Fluid coming from the vagina, if you are female. Pain in the belly or lower back. Other symptoms include: Vomiting. Not feeling hungry. Feeling mixed up (confused). This may be the first symptom in  older adults. Being tired and grouchy (irritable). A fever. Watery poop (diarrhea). How is this treated? Taking antibiotic medicine. Taking other medicines. Drinking enough water. In some cases, you may need to see a specialist. Follow these instructions at home:  Medicines Take over-the-counter and prescription medicines only as told by your doctor. If you were prescribed an antibiotic medicine, take it as told by your doctor. Do not stop taking it even if you start to feel better. General instructions Make sure you: Pee until your bladder is empty. Do not hold pee for a long time. Empty your bladder after sex. Wipe from front to back after peeing or pooping if you are a female. Use each tissue one time when you wipe. Drink enough fluid to keep your pee pale yellow. Keep all follow-up visits. Contact a doctor if: You do not get better after 1-2 days. Your symptoms go away and then come back. Get help right away if: You have very bad back pain. You have very bad pain in your lower belly. You have a fever. You have chills. You feeling like you will vomit or you vomit. Summary A urinary tract infection (UTI) is an infection of any part of the urinary tract. This condition is caused by germs in your genital area. There are many risk factors for a UTI. Treatment includes antibiotic medicines. Drink enough fluid to keep your pee pale yellow. This information is not intended to replace advice given to you by your health care provider. Make sure you discuss any questions you have with your health care provider. Document Revised: 07/01/2020 Document Reviewed: 07/01/2020 Elsevier Patient Education    2023 Elsevier Inc.  

## 2022-04-12 NOTE — Telephone Encounter (Signed)
Summary: personal discomfort  ? The patient would like to speak with a member of clinical staff when possible  ? ?The patient has experienced personal discomfort for roughly a week  ? ?The patient is experiencing frequent urination and discomfort  ? ?Please contact further when possible   ?  ? ?Reason for Disposition ? Urinating more frequently than usual (i.e., frequency) ? ?Answer Assessment - Initial Assessment Questions ?1. SYMPTOM: "What's the main symptom you're concerned about?" (e.g., frequency, incontinence) ?    Frequency, burning ?2. ONSET: "When did the  symptoms  start?" ?    2-3 days ago ?3. PAIN: "Is there any pain?" If Yes, ask: "How bad is it?" (Scale: 1-10; mild, moderate, severe) ?    Pain with first morning urine ?4. CAUSE: "What do you think is causing the symptoms?" ?    Possible UTI ?5. OTHER SYMPTOMS: "Do you have any other symptoms?" (e.g., fever, flank pain, blood in urine, pain with urination) ?    Pain with urination, back pain ?6. PREGNANCY: "Is there any chance you are pregnant?" "When was your last menstrual period?" ?    No- LMP-last week ? ?Protocols used: Urinary Symptoms-A-AH ? ?

## 2022-04-16 ENCOUNTER — Ambulatory Visit: Payer: Self-pay | Admitting: *Deleted

## 2022-04-16 NOTE — Telephone Encounter (Signed)
Summary: advice - medication  ? Pt called in stating she recently had a UTI and took medication that she believes has not caused a yeast infection. Pt was asking for medication to be sent in for her yeast infection, please advise.   ?  ? ?Reason for Disposition ? Symptoms of a vaginal yeast infection (i.e., white, thick, cottage-cheese-like, itchy, not bad smelling discharge) ? ?Answer Assessment - Initial Assessment Questions ?1. DISCHARGE: "Describe the discharge." (e.g., white, yellow, green, gray, foamy, cottage cheese-like) ?    Thick white discharge ?2. ODOR: "Is there a bad odor?" ?    no ?3. ONSET: "When did the discharge begin?" ?    [place] Mylar- today is last day of antibiotic ?4. RASH: "Is there a rash in that area?" If Yes, ask: "Describe it." (e.g., redness, blisters, sores, bumps) ?    no ?5. ABDOMINAL PAIN: "Are you having any abdominal pain?" If Yes, ask: "What does it feel like? " (e.g., crampy, dull, intermittent, constant)  ?    no ?6. ABDOMINAL PAIN SEVERITY: If present, ask: "How bad is it?"  (e.g., mild, moderate, severe) ? - MILD - doesn't interfere with normal activities  ? - MODERATE - interferes with normal activities or awakens from sleep  ? - SEVERE - patient doesn't want to move (R/O peritonitis)  ?    no ?7. CAUSE: "What do you think is causing the discharge?" "Have you had the same problem before? What happened then?" ?    Yeast from antibiotic ?8. OTHER SYMPTOMS: "Do you have any other symptoms?" (e.g., fever, itching, vaginal bleeding, pain with urination, injury to genital area, vaginal foreign body) ?    Itching, irritation, discharge ?9. PREGNANCY: "Is there any chance you are pregnant?" "When was your last menstrual period?" ? ?Protocols used: Vaginal Discharge-A-AH ? ?

## 2022-04-16 NOTE — Telephone Encounter (Signed)
?  Chief Complaint: yeast infection after antibiotic ?Symptoms: itching, discharge ?Frequency: symptoms started yesterday ?Pertinent Negatives: Patient denies   ?Disposition: '[]'$ ED /'[]'$ Urgent Care (no appt availability in office) / '[]'$ Appointment(In office/virtual)/ '[]'$  [facility]/ '[x]'$ Home Care/ '[]'$ Refused Recommended Disposition /'[]'$ [facility]/ '[]'$  Follow-up with PCP ?Additional Notes: Patient agreeable to OTC yeast medication- will call back if symptoms do not improve.  ?

## 2022-04-17 DIAGNOSIS — H524 Presbyopia: Secondary | ICD-10-CM | POA: Diagnosis not present

## 2022-04-25 ENCOUNTER — Ambulatory Visit: Payer: Medicaid Other | Admitting: Family Medicine

## 2022-05-12 IMAGING — US US PELVIS COMPLETE WITH TRANSVAGINAL
1 series · 14 of 25 positions shown · non-contrast
Comparison: 07/05/2016

CLINICAL DATA: Dyspareunia

EXAM:
TRANSABDOMINAL AND TRANSVAGINAL ULTRASOUND OF PELVIS
TECHNIQUE: Both transabdominal and transvaginal ultrasound examinations of the
pelvis were performed. Transabdominal technique was performed for
global imaging of the pelvis including uterus, ovaries, adnexal
regions, and pelvic cul-de-sac. It was necessary to proceed with
endovaginal exam following the transabdominal exam to visualize the
uterus endometrium ovaries.

[Series 1: us pelvic complete with transvaginal · 14 of 82 slices shown]
[im 1/82]
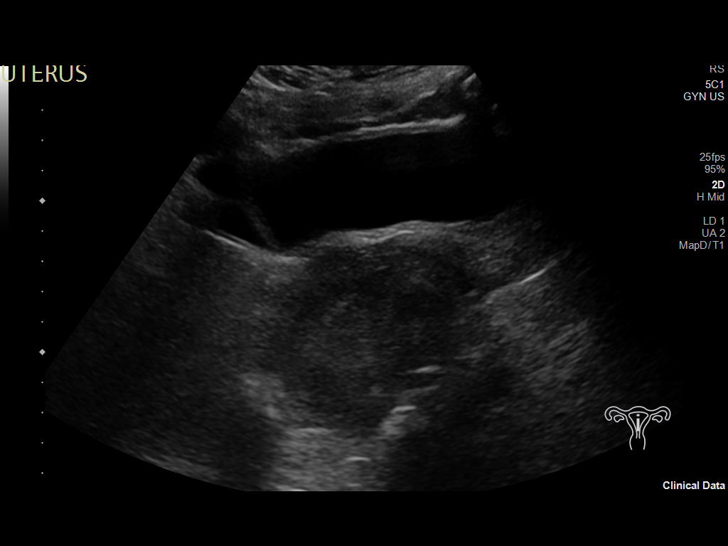
[im 7/82]
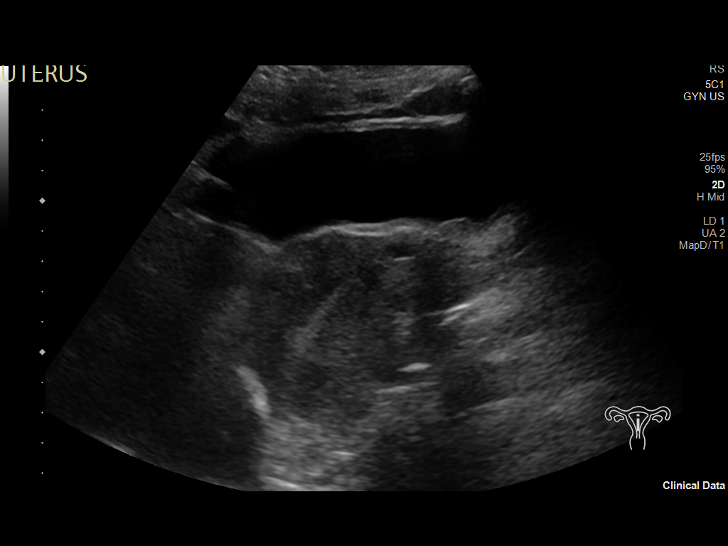
[im 14/82]
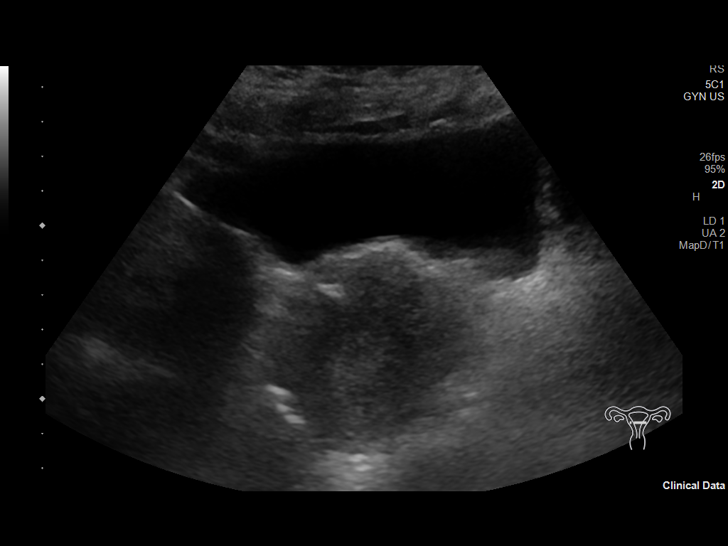
[im 21/82]
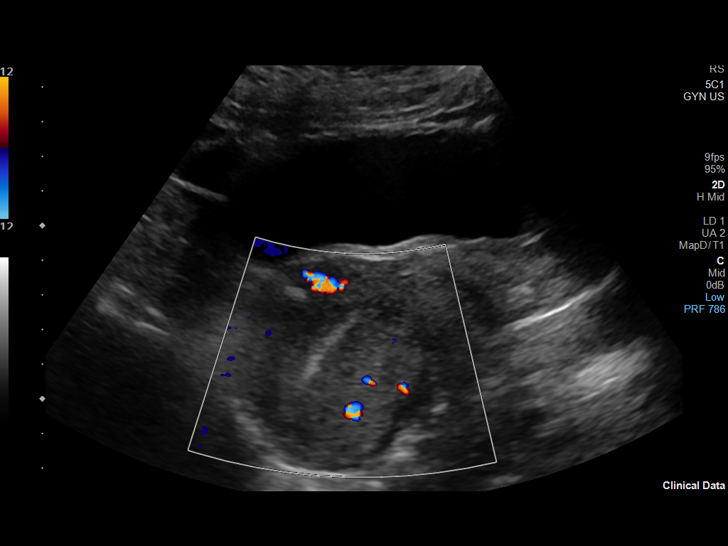
[im 28/82]
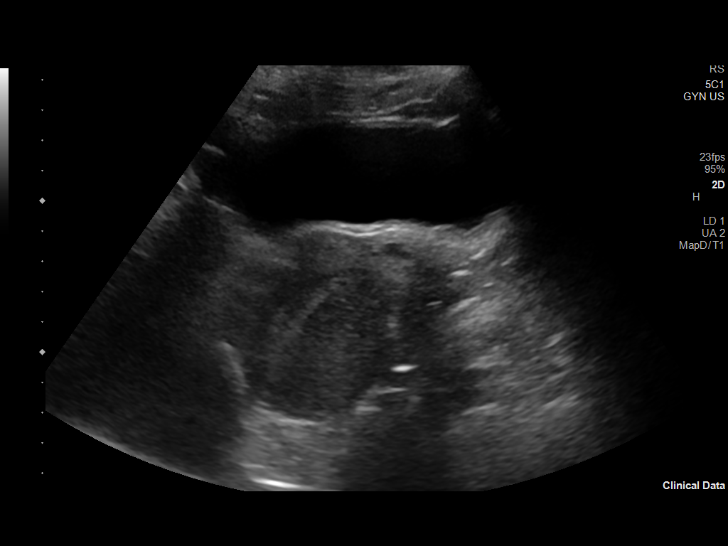
[im 31/82]
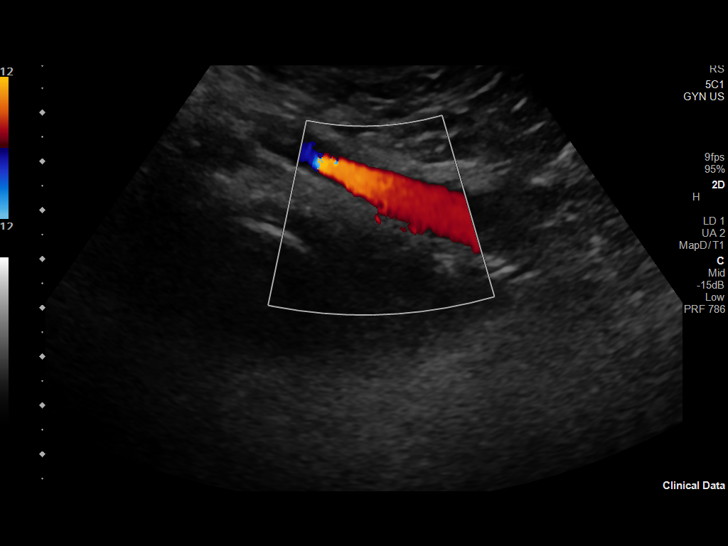
[im 38/82]
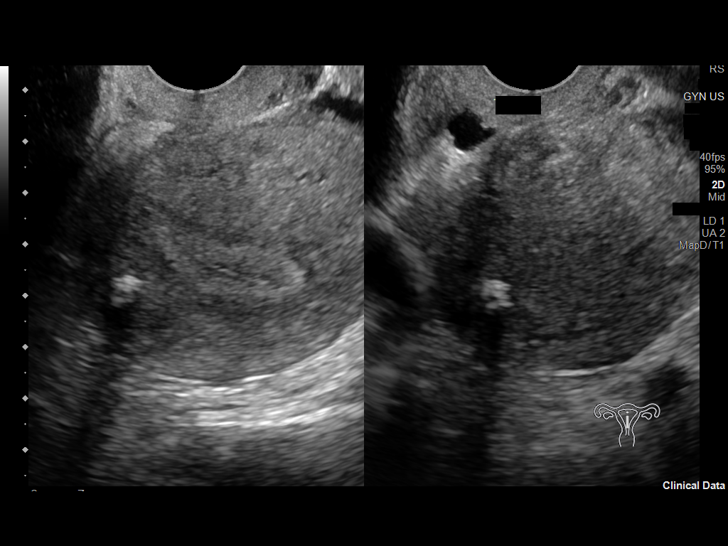
[im 44/82]
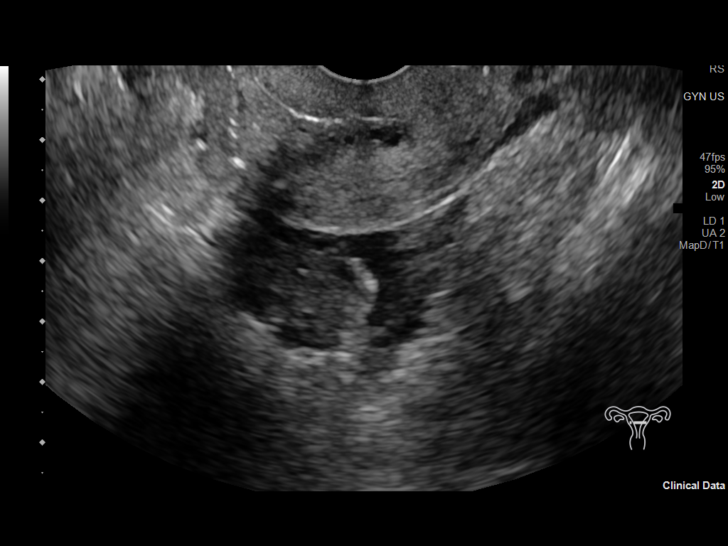
[im 51/82]
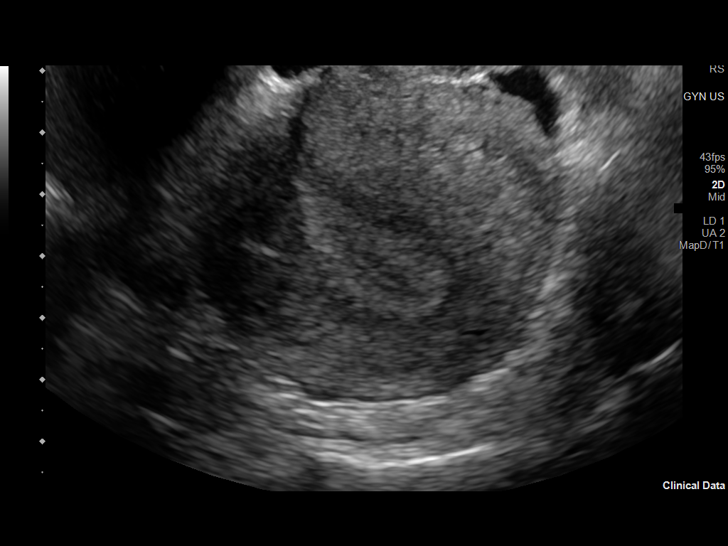
[im 55/82]
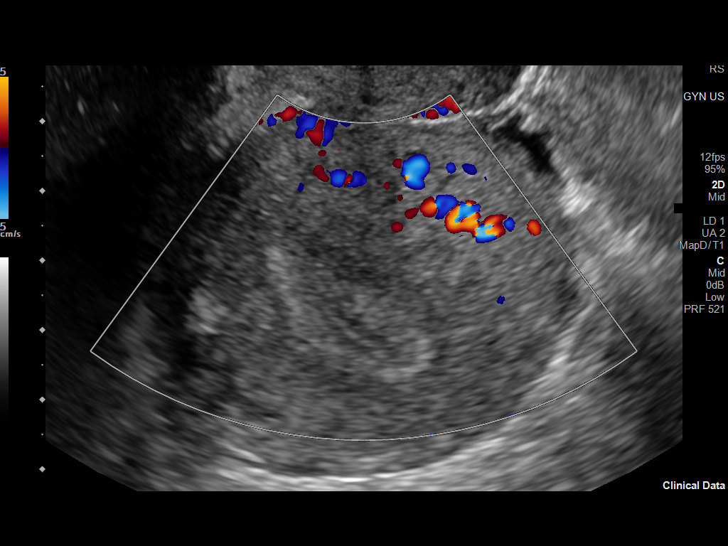
[im 61/82]
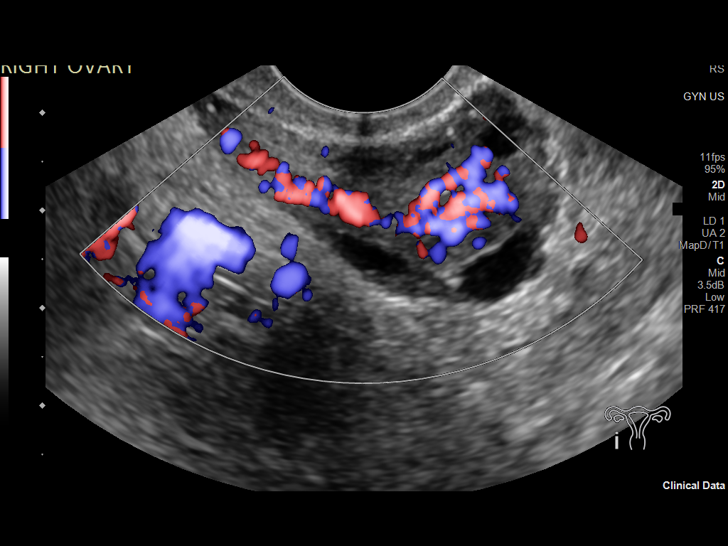
[im 68/82]
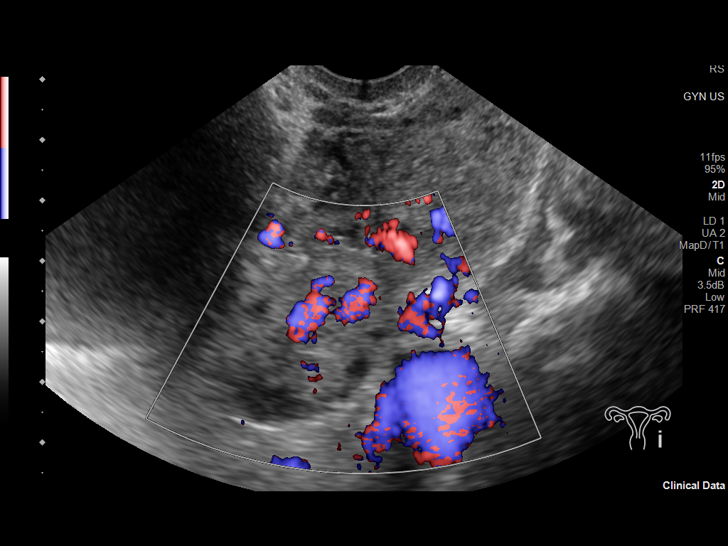
[im 75/82]
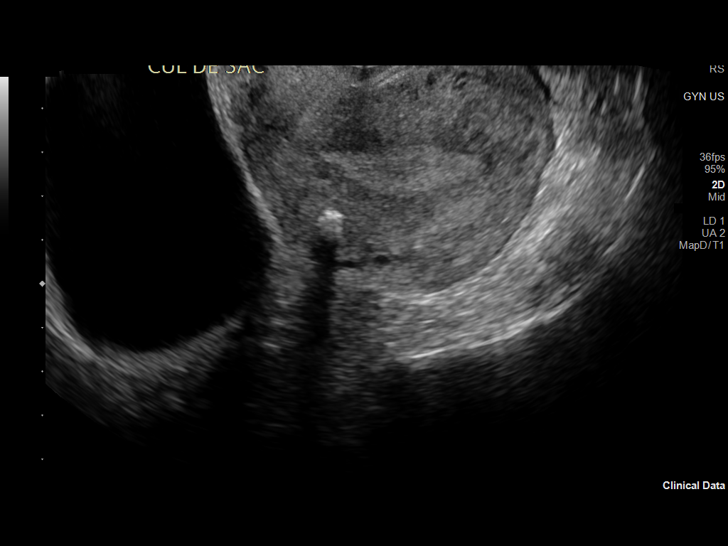
[im 82/82]
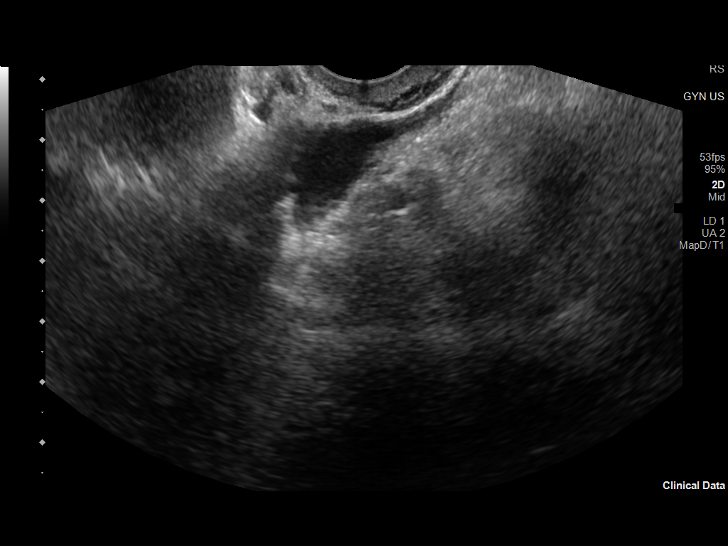

[14 of 25 positions shown; findings below may reference images not displayed]

FINDINGS: Uterus

Measurements: 8.7 x 5.3 x 5.4 cm = volume: 130.6 mL. Calcified
fibroid within the anterior uterus measures 13 mm.

Endometrium

Thickness: 12 mm.  No focal abnormality visualized.

Right ovary

Measurements: 3.4 x 2.2 x 1.9 cm = volume: 7.5 mL. Normal
appearance/no adnexal mass.

Left ovary

Measurements: 3.3 x 2.5 x 2.9 cm = volume: 12.7 mL. Normal
appearance/no adnexal mass.

Other findings

Small free fluid
IMPRESSION: 1. Small calcified uterine fibroid.  Small free fluid.
2. Otherwise negative pelvic ultrasound

## 2022-05-13 DIAGNOSIS — N3001 Acute cystitis with hematuria: Secondary | ICD-10-CM | POA: Diagnosis not present

## 2022-05-13 DIAGNOSIS — R300 Dysuria: Secondary | ICD-10-CM | POA: Diagnosis not present

## 2022-05-28 ENCOUNTER — Encounter: Payer: Medicaid Other | Admitting: Gastroenterology

## 2022-06-24 ENCOUNTER — Other Ambulatory Visit: Payer: Self-pay | Admitting: Nurse Practitioner

## 2022-06-26 NOTE — Telephone Encounter (Signed)
Requested medications are due for refill today.  yes  Requested medications are on the active medications list.  yes  Last refill. 01/18/2022 #12 1 refill  Future visit scheduled.   no  Notes to clinic.  Medication needs review by provider.    Requested Prescriptions  Pending Prescriptions Disp Refills   Vitamin D, Ergocalciferol, (DRISDOL) 1.25 MG (50000 UNIT) CAPS capsule [Pharmacy Med Name: VITAMIN D2 50,000IU (ERGO) CAP RX] 12 capsule 1    Sig: TAKE 1 CAPSULE BY MOUTH EVERY 7 DAYS     Endocrinology:  Vitamins - Vitamin D Supplementation 2 Failed - 06/24/2022  9:58 AM      Failed - Manual Review: Route requests for 50,000 IU strength to the provider      Failed - Ca in normal range and within 360 days    Calcium  Date Value Ref Range Status  01/17/2022 10.3 (H) 8.7 - 10.2 mg/dL Final         Failed - Vitamin D in normal range and within 360 days    Vit D, 25-Hydroxy  Date Value Ref Range Status  01/17/2022 11.2 (L) 30.0 - 100.0 ng/mL Final    Comment:    Vitamin D deficiency has been defined by the Institute of Medicine and an Endocrine Society practice guideline as a level of serum 25-OH vitamin D less than 20 ng/mL (1,2). The Endocrine Society went on to further define vitamin D insufficiency as a level between 21 and 29 ng/mL (2). 1. IOM (Institute of Medicine). 2010. Dietary reference    intakes for calcium and D. [place]: The    [employer]. 2. Holick MF, Binkley Muldrow, Bischoff-Ferrari HA, et al.    Evaluation, treatment, and prevention of vitamin D    deficiency: an Endocrine Society clinical practice    guideline. JCEM. 2011 Jul; 96(7):1911-30.          Passed - Valid encounter within last 12 months    Recent Outpatient Visits           5 months ago Annual physical exam   [place], [place] W, NP   11 months ago Acute cystitis with hematuria   [facility], [place]   1  year ago [place], Zelda W, NP   2 years ago Essential hypertension   [place], Stephen L, RPH-CPP   2 years ago Prediabetes   [place], Vernia Buff, NP

## 2022-06-27 ENCOUNTER — Telehealth: Payer: Self-pay | Admitting: *Deleted

## 2022-06-27 NOTE — Telephone Encounter (Signed)
Patient no showed PV today- Called patient and left message to return call by 5 pm today- If no call by 5 pm, PV and procedure will be canceled

## 2022-06-27 NOTE — Telephone Encounter (Signed)
Pt did not return call to reschedule PV.  PV and colonoscopy cancelled.  No show letter to patient.

## 2022-07-25 ENCOUNTER — Encounter: Payer: Medicaid Other | Admitting: Gastroenterology

## 2022-07-26 ENCOUNTER — Encounter (HOSPITAL_BASED_OUTPATIENT_CLINIC_OR_DEPARTMENT_OTHER): Payer: Self-pay

## 2022-07-26 ENCOUNTER — Emergency Department (HOSPITAL_BASED_OUTPATIENT_CLINIC_OR_DEPARTMENT_OTHER)
Admission: EM | Admit: 2022-07-26 | Discharge: 2022-07-27 | Disposition: A | Discharge status: Home / Self Care | Payer: Medicaid Other | Attending: Emergency Medicine | Admitting: Emergency Medicine

## 2022-07-26 ENCOUNTER — Emergency Department (HOSPITAL_BASED_OUTPATIENT_CLINIC_OR_DEPARTMENT_OTHER): Payer: Medicaid Other | Admitting: Radiology

## 2022-07-26 ENCOUNTER — Other Ambulatory Visit: Payer: Self-pay

## 2022-07-26 DIAGNOSIS — M79672 Pain in left foot: Secondary | ICD-10-CM | POA: Diagnosis not present

## 2022-07-26 DIAGNOSIS — I10 Essential (primary) hypertension: Secondary | ICD-10-CM | POA: Diagnosis not present

## 2022-07-26 DIAGNOSIS — R2242 Localized swelling, mass and lump, left lower limb: Secondary | ICD-10-CM | POA: Diagnosis not present

## 2022-07-26 DIAGNOSIS — R609 Edema, unspecified: Secondary | ICD-10-CM | POA: Diagnosis not present

## 2022-07-26 DIAGNOSIS — M7989 Other specified soft tissue disorders: Secondary | ICD-10-CM | POA: Diagnosis not present

## 2022-07-26 DIAGNOSIS — X501XXA Overexertion from prolonged static or awkward postures, initial encounter: Secondary | ICD-10-CM | POA: Diagnosis not present

## 2022-07-26 DIAGNOSIS — R600 Localized edema: Secondary | ICD-10-CM | POA: Diagnosis not present

## 2022-07-26 NOTE — ED Triage Notes (Signed)
Patient here POV from Home.  Endorses Standing on her Left Foot on Monday and upon Standing there was a Sharp Pain that radiated upwards. Swelling and Pain noted to Left Foot/Ankle.   NAD Noted during Triage. A&Ox4. GCS 15. Ambulatory.

## 2022-07-26 NOTE — ED Provider Notes (Signed)
Emergency Department Provider Note   I have reviewed the triage vital signs and the nursing notes.   HISTORY  Chief Complaint Foot Pain   HPI Sabrina Larson is a 46 y.o. female past history reviewed below including hypertension presents to the emergency department with left foot pain and swelling.  Patient has had 4 days of symptoms.  She states she had a sharp pain from the bottom of her foot radiating up to the knee after stepping down awkwardly from bed.  No rolling of the ankle or fall.  Swelling developed the next day and now involves the entire foot and lower leg/ankle.  She has pain with ambulation.  No fevers, chills, redness to the leg.  No rash developing.  No pain to the lower back or upper leg. No history of DVT.   Past Medical History:  Diagnosis Date   GERD (gastroesophageal reflux disease)    Hypertension    Prediabetes     Review of Systems  Constitutional: No fever/chills Cardiovascular: Denies chest pain. Respiratory: Denies shortness of breath. Gastrointestinal: No abdominal pain. Genitourinary: Negative for dysuria. Musculoskeletal: Positive left foot swelling/pain.  Skin: Negative for rash. Neurological: Negative for numbness.    ____________________________________________   PHYSICAL EXAM:  VITAL SIGNS: ED Triage Vitals  Enc Vitals Group     BP 07/26/22 2129 (!) 134/93     Pulse Rate 07/26/22 2129 94     Resp 07/26/22 2129 20     Temp 07/26/22 2129 98.3 F (36.8 C)     Temp src --      SpO2 07/26/22 2129 100 %     Weight 07/26/22 2127 192 lb 0.3 oz (87.1 kg)     Height 07/26/22 2127 5' 6.5" (1.689 m)   Constitutional: Alert and oriented. Well appearing and in no acute distress. Eyes: Conjunctivae are normal. Head: Atraumatic. Nose: No congestion/rhinnorhea. Mouth/Throat: Mucous membranes are moist.   Neck: No stridor.  Cardiovascular: Normal rate, regular rhythm. Good peripheral circulation. Grossly normal heart sounds.    Respiratory: Normal respiratory effort.  No retractions. Lungs CTAB. Gastrointestinal: Soft and nontender. No distention.  Musculoskeletal: The left foot and ankle are diffusely swollen without significant erythema or warmth to touch.  No overlying rash or focal area of fluctuance.  2+ DP/PT pulses and normal sensation in all foot dermatomes. Neurologic:  Normal speech and language. No gross focal neurologic deficits are appreciated.  Skin:  Skin is warm, dry and intact. No rash noted. ____________________________________________  RADIOLOGY  DG Foot Complete Left  Result Date: 07/26/2022 CLINICAL DATA:  Pain EXAM: LEFT FOOT - COMPLETE 3+ VIEW COMPARISON:  None Available. FINDINGS: There is no evidence of fracture or dislocation. There is no evidence of arthropathy or other focal bone abnormality. Edema medial side of the foot and ankle IMPRESSION: Negative. Electronically Signed   By: Donavan Foil M.D.   On: 07/26/2022 22:14   DG Ankle Complete Left  Result Date: 07/26/2022 CLINICAL DATA:  Swelling and pain EXAM: LEFT ANKLE COMPLETE - 3+ VIEW COMPARISON:  None Available. FINDINGS: No fracture or malalignment. Edema on the medial side of the lower leg and ankle IMPRESSION: Edema without acute osseous abnormality Electronically Signed   By: Donavan Foil M.D.   On: 07/26/2022 22:13    ____________________________________________   PROCEDURES  Procedure(s) performed:   Procedures  None  ____________________________________________   INITIAL IMPRESSION / ASSESSMENT AND PLAN / ED COURSE  Pertinent labs & imaging results that were available during my  care of the patient were reviewed by me and considered in my medical decision making (see chart for details).   This patient is Presenting for Evaluation of foot pain and swelling, which does require a range of treatment options, and is a complaint that involves a high risk of morbidity and mortality.  The Differential Diagnoses include  MSK strain, sprain, fracture, dislocation, septic joint, cellulitis, DVT, etc.   Radiologic Tests Ordered, included plain films of foot and ankle. I independently interpreted the images and agree with radiology interpretation.   Medical Decision Making: Summary:  Patient presents emergency department diffuse foot and ankle swelling.  Symptoms began with a fairly mild trauma.  No area of skin breakdown or clear presentation to suggest cellulitis.  Exam not consistent with compartment syndrome.  Plain films of the foot and ankle without bony abnormality.  Crutches provided for comfort.  Plan for NSAIDs and RICE therapy at home. Sports medicine follow up provided and DVT US ordered for tomorrow at [facility]. Patient given number to call in the AM for [place] appointment. Discussed ED return precautions and PCP follow up plan.    Disposition: discharge  ____________________________________________  FINAL CLINICAL IMPRESSION(S) / ED DIAGNOSES  Final diagnoses:  Localized swelling of left foot     NEW OUTPATIENT MEDICATIONS STARTED DURING THIS VISIT:  Discharge Medication List as of 07/27/2022 12:06 AM     START taking these medications   Details  diclofenac Sodium (VOLTAREN) 1 % GEL Apply 2 g topically 4 (four) times daily., Starting Fri 07/27/2022, Normal    meloxicam (MOBIC) 7.5 MG tablet Take 1 tablet (7.5 mg total) by mouth daily for 7 days., Starting Fri 07/27/2022, Until Fri 08/03/2022, Normal        Note:  This document was prepared using Dragon voice recognition software and may include unintentional dictation errors.  Nanda Quinton, MD, [facility] Emergency Medicine    Sam Wunschel, [place], MD 07/27/22 [id number]

## 2022-07-27 ENCOUNTER — Ambulatory Visit (HOSPITAL_BASED_OUTPATIENT_CLINIC_OR_DEPARTMENT_OTHER)
Admission: RE | Admit: 2022-07-27 | Discharge: 2022-07-27 | Disposition: A | Discharge status: Home / Self Care | Payer: Medicaid Other | Source: Ambulatory Visit | Attending: Emergency Medicine | Admitting: Emergency Medicine

## 2022-07-27 DIAGNOSIS — M79605 Pain in left leg: Secondary | ICD-10-CM | POA: Insufficient documentation

## 2022-07-27 DIAGNOSIS — I82812 Embolism and thrombosis of superficial veins of left lower extremities: Secondary | ICD-10-CM | POA: Insufficient documentation

## 2022-07-27 DIAGNOSIS — M79662 Pain in left lower leg: Secondary | ICD-10-CM | POA: Diagnosis not present

## 2022-07-27 MED ORDER — DICLOFENAC SODIUM 1 % EX GEL: 2.0000 g | Freq: Four times a day (QID) | CUTANEOUS | 0 refills | Status: DC

## 2022-07-27 MED ORDER — MELOXICAM 7.5 MG PO TABS: 7.5000 mg | ORAL_TABLET | Freq: Every day | ORAL | 0 refills | Status: AC

## 2022-07-27 NOTE — Discharge Instructions (Addendum)
You were seen in the ED today with foot swelling and pain. I have ordered an ultrasound to be performed at the [facility]. Please call [phone] to schedule that appointment in the AM.

## 2022-07-27 NOTE — ED Provider Notes (Signed)
45 year old female presented today for the results of her ultrasound after her ED visit yesterday.  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  Upon research, this is not routinely treated with anticoagulation.  She has a follow-up appointment with orthopedics on Monday and she may discuss her injury with them as well as her PCP.  She is agreeable to this plan.  Ambulated out of the department with crutches.   Sabrina Larson 07/27/22 1642    Ezequiel Essex, MD 07/27/22 1946

## 2022-08-02 ENCOUNTER — Ambulatory Visit: Payer: Medicaid Other | Admitting: Family Medicine

## 2022-10-03 DIAGNOSIS — U071 COVID-19: Secondary | ICD-10-CM

## 2022-10-03 HISTORY — DX: COVID-19: U07.1

## 2022-10-29 ENCOUNTER — Ambulatory Visit: Payer: Self-pay | Admitting: *Deleted

## 2022-10-29 NOTE — Telephone Encounter (Signed)
Message from [place] sent at 10/29/2022 11:04 AM EST  Summary: flu like symptoms   Pt states she either has covid, flu, or an ear infection  Pt would not give symptom details  Please fu w/ pt  Phone [phone]          Call History   Type Contact Phone/Fax User  10/29/2022 11:03 AM EST Phone (Incoming) [place], [place] (Self) [phone] Jerilynn Mages) [place], Loura Pardon   Reason for Disposition  Common cold with no complications  Answer Assessment - Initial Assessment Questions 1. ONSET: "When did the nasal discharge start?"      Body aches, runny nose, sore throat and low grade fever and right ear is hurting. 2. AMOUNT: "How much discharge is there?"      Nose is stopped up. 3. COUGH: "Do you have a cough?" If Yes, ask: "Describe the color of your sputum" (clear, white, yellow, green)     Yes     Mucus is yellow      4. RESPIRATORY DISTRESS: "Describe your breathing."      Had chest tightness.   None now 5. FEVER: "Do you have a fever?" If Yes, ask: "What is your temperature, how was it measured, and when did it start?"     Low grade fever 6. SEVERITY: "Overall, how bad are you feeling right now?" (e.g., doesn't interfere with normal activities, staying home from school/work, staying in bed)      Body aches 7. OTHER SYMPTOMS: "Do you have any other symptoms?" (e.g., sore throat, earache, wheezing, vomiting)     I have not done a Covid test.    See above Mucinex and cough drops.    Saline being used in my nose.   My sinuses are congestion.    8. PREGNANCY: "Is there any chance you are pregnant?" "When was your last menstrual period?"     Not asked  Protocols used: Common Cold-A-AH

## 2022-10-29 NOTE — Telephone Encounter (Signed)
  Chief Complaint: Right ear pain, sore throat, nasal congestion, body aches, low grade fever, mild cough with yellow mucus.  Symptoms: above Frequency: not asked Pertinent Negatives: Patient denies bad cough.   Disposition: '[]'$ ED /'[]'$ Urgent Care (no appt availability in office) / '[]'$ Appointment(In office/virtual)/ '[]'$  [facility]/ '[]'$ Home Care/ '[]'$ Refused Recommended Disposition /'[x]'$ [place] [facility]/ '[]'$  Follow-up with PCP Additional Notes: No appts. Available with [facility] in timeframe needed so I recommended the [facility].  It will be in [place] 10/30/2022 so I gave her the location and hours.   She was agreeable to going there if the OTC medications were not helping.

## 2022-10-30 DIAGNOSIS — R059 Cough, unspecified: Secondary | ICD-10-CM | POA: Diagnosis not present

## 2022-10-30 DIAGNOSIS — U071 COVID-19: Secondary | ICD-10-CM | POA: Diagnosis not present

## 2022-10-30 DIAGNOSIS — H6691 Otitis media, unspecified, right ear: Secondary | ICD-10-CM | POA: Diagnosis not present

## 2022-10-30 DIAGNOSIS — J029 Acute pharyngitis, unspecified: Secondary | ICD-10-CM | POA: Diagnosis not present

## 2022-11-27 ENCOUNTER — Other Ambulatory Visit: Payer: Self-pay

## 2022-11-27 ENCOUNTER — Emergency Department (HOSPITAL_BASED_OUTPATIENT_CLINIC_OR_DEPARTMENT_OTHER): Payer: Medicaid Other

## 2022-11-27 ENCOUNTER — Encounter (HOSPITAL_BASED_OUTPATIENT_CLINIC_OR_DEPARTMENT_OTHER): Payer: Self-pay

## 2022-11-27 DIAGNOSIS — O200 Threatened abortion: Secondary | ICD-10-CM | POA: Insufficient documentation

## 2022-11-27 DIAGNOSIS — Z3A01 Less than 8 weeks gestation of pregnancy: Secondary | ICD-10-CM | POA: Diagnosis not present

## 2022-11-27 LAB — CBC WITH DIFFERENTIAL/PLATELET
Abs Immature Granulocytes: 0.01 10*3/uL (ref 0.00–0.07)
Basophils Absolute: 0 10*3/uL (ref 0.0–0.1)
Basophils Relative: 0 %
Eosinophils Absolute: 0.2 10*3/uL (ref 0.0–0.5)
Eosinophils Relative: 4 %
HCT: 37.3 % (ref 36.0–46.0)
Hemoglobin: 12.4 g/dL (ref 12.0–15.0)
Immature Granulocytes: 0 %
Lymphocytes Relative: 63 %
Lymphs Abs: 3.6 10*3/uL (ref 0.7–4.0)
MCH: 26.6 pg (ref 26.0–34.0)
MCHC: 33.2 g/dL (ref 30.0–36.0)
MCV: 80 fL (ref 80.0–100.0)
Monocytes Absolute: 0.4 10*3/uL (ref 0.1–1.0)
Monocytes Relative: 8 %
Neutro Abs: 1.5 10*3/uL — ABNORMAL LOW (ref 1.7–7.7)
Neutrophils Relative %: 25 %
Platelets: 255 10*3/uL (ref 150–400)
RBC: 4.66 MIL/uL (ref 3.87–5.11)
RDW: 14.6 % (ref 11.5–15.5)
WBC: 5.7 10*3/uL (ref 4.0–10.5)
nRBC: 0 % (ref 0.0–0.2)

## 2022-11-27 LAB — URINALYSIS, ROUTINE W REFLEX MICROSCOPIC
Bacteria, UA: NONE SEEN
Bilirubin Urine: NEGATIVE
Glucose, UA: NEGATIVE mg/dL
Ketones, ur: NEGATIVE mg/dL
Nitrite: NEGATIVE
RBC / HPF: >50 RBC/hpf — ABNORMAL HIGH (ref 0–5)
Specific Gravity, Urine: 1.01 (ref 1.005–1.030)
pH: 6.5 (ref 5.0–8.0)

## 2022-11-27 LAB — HCG, QUANTITATIVE, PREGNANCY: hCG, Beta Chain, Quant, S: 18205 m[IU]/mL — ABNORMAL HIGH (ref <5)

## 2022-11-27 LAB — COMPREHENSIVE METABOLIC PANEL
ALT: 8 U/L (ref 0–44)
AST: 13 U/L — ABNORMAL LOW (ref 15–41)
Albumin: 3.8 g/dL (ref 3.5–5.0)
Alkaline Phosphatase: 61 U/L (ref 38–126)
Anion gap: 9 (ref 5–15)
BUN: 10 mg/dL (ref 6–20)
CO2: 27 mmol/L (ref 22–32)
Calcium: 9.1 mg/dL (ref 8.9–10.3)
Chloride: 105 mmol/L (ref 98–111)
Creatinine, Ser: 0.54 mg/dL (ref 0.44–1.00)
GFR, Estimated: >60 mL/min (ref >60)
Glucose, Bld: 96 mg/dL (ref 70–99)
Potassium: 3.1 mmol/L — ABNORMAL LOW (ref 3.5–5.1)
Sodium: 141 mmol/L (ref 135–145)
Total Bilirubin: 0.3 mg/dL (ref 0.3–1.2)
Total Protein: 6.9 g/dL (ref 6.5–8.1)

## 2022-11-27 NOTE — ED Triage Notes (Signed)
Pt reports that she is approx 7-[redacted] weeks pregnant and began "spotting" x 4 days ago that has increased but pt states bleeding is not enough to fill 1 pad. Pt denies abdominal cramping/pain. [phone])

## 2022-11-28 ENCOUNTER — Other Ambulatory Visit (INDEPENDENT_AMBULATORY_CARE_PROVIDER_SITE_OTHER): Payer: Medicaid Other | Admitting: Obstetrics and Gynecology

## 2022-11-28 ENCOUNTER — Emergency Department (HOSPITAL_BASED_OUTPATIENT_CLINIC_OR_DEPARTMENT_OTHER)
Admission: EM | Admit: 2022-11-28 | Discharge: 2022-11-28 | Disposition: A | Discharge status: Home / Self Care | Payer: Medicaid Other | Attending: Emergency Medicine | Admitting: Emergency Medicine

## 2022-11-28 ENCOUNTER — Emergency Department (HOSPITAL_BASED_OUTPATIENT_CLINIC_OR_DEPARTMENT_OTHER): Payer: Medicaid Other

## 2022-11-28 DIAGNOSIS — O209 Hemorrhage in early pregnancy, unspecified: Secondary | ICD-10-CM | POA: Diagnosis not present

## 2022-11-28 DIAGNOSIS — Z3A01 Less than 8 weeks gestation of pregnancy: Secondary | ICD-10-CM | POA: Diagnosis not present

## 2022-11-28 DIAGNOSIS — O200 Threatened abortion: Secondary | ICD-10-CM

## 2022-11-28 DIAGNOSIS — O021 Missed abortion: Secondary | ICD-10-CM

## 2022-11-28 MED ORDER — MISOPROSTOL 200 MCG PO TABS: ORAL_TABLET | ORAL | 1 refills | Status: DC

## 2022-11-28 NOTE — ED Notes (Signed)
Pt reassessed and vitals updated. Pt updated on wait status and encouraged to notify staff if any change and/or worsening of condition.

## 2022-11-28 NOTE — ED Provider Notes (Signed)
[place] EMERGENCY DEPT Provider Note   CSN: [id number] Arrival date & time: 11/27/22  1925     History  Chief Complaint  Patient presents with   Threatened Miscarriage    Sabrina Larson is a 46 y.o. female.  The history is provided by the patient.  Patient reports she is approximately [redacted] weeks pregnant.  She reports multiple pregnancies previously.  She also reports previous history of ectopic pregnancy.  She reports about 4 days ago she began having mild vaginal spotting. Over the past day she has had increasing bleeding.  Denies any blood clots or any other products. She has mild back and abdominal pain. Patient with history of hypertension, but is not on anticoagulation     Home Medications Prior to Admission medications   Medication Sig Start Date End Date Taking? Authorizing Provider  azelastine (OPTIVAR) 0.05 % ophthalmic solution Place 1 drop into both eyes 2 (two) times daily. 12/06/19   Gildardo Pounds, NP  Blood Pressure Monitor DEVI Please provide patient with insurance approved blood pressure monitor ICD10 (I10) 02/09/20   Gildardo Pounds, NP  diclofenac Sodium (VOLTAREN) 1 % GEL Apply 2 g topically 4 (four) times daily. 07/27/22   Long, Wonda Olds, MD  hydrochlorothiazide (HYDRODIURIL) 25 MG tablet Take 1 tablet (25 mg total) by mouth daily. 01/17/22   Gildardo Pounds, NP  Olopatadine HCl 0.2 % SOLN Apply 1 drop to eye daily as needed (itchy/watery eyes). 08/30/20   Garnet Sierras, DO  Vitamin D, Ergocalciferol, (DRISDOL) 1.25 MG (50000 UNIT) CAPS capsule Take 1 capsule (50,000 Units total) by mouth every 7 (seven) days. 01/18/22   Gildardo Pounds, NP      Allergies    Patient has no known allergies.    Review of Systems   Review of Systems  Genitourinary:  Positive for vaginal bleeding.    Physical Exam Updated Vital Signs BP (!) 164/96   Pulse 66   Temp 98.4 F (36.9 C) (Oral)   Resp 18   Ht 1.689 m (5' 6.5")   Wt 83.9 kg   LMP  10/09/2022 (Exact Date)   SpO2 99%   BMI 29.41 kg/m  Physical Exam CONSTITUTIONAL: Well developed/well nourished, no distress HEAD: Normocephalic/atraumatic EYES: EOMI ABDOMEN: soft, nontender, no rebound or guarding GU:no cva tenderness. Patient declines pelvic exam NEURO: Pt is awake/alert/appropriate, moves all extremitiesx4.  No facial droop.   SKIN: warm, color normal PSYCH: no abnormalities of mood noted, alert and oriented to situation  ED Results / Procedures / Treatments   Labs (all labs ordered are listed, but only abnormal results are displayed) Labs Reviewed  CBC WITH DIFFERENTIAL/PLATELET - Abnormal; Notable for the following components:      Result Value   Neutro Abs 1.5 (*)    All other components within normal limits  COMPREHENSIVE METABOLIC PANEL - Abnormal; Notable for the following components:   Potassium 3.1 (*)    AST 13 (*)    All other components within normal limits  HCG, QUANTITATIVE, PREGNANCY - Abnormal; Notable for the following components:   hCG, Beta Chain, Quant, S 18,205 (*)    All other components within normal limits  URINALYSIS, ROUTINE W REFLEX MICROSCOPIC - Abnormal; Notable for the following components:   Color, Urine COLORLESS (*)    APPearance HAZY (*)    Hgb urine dipstick LARGE (*)    Protein, ur TRACE (*)    Leukocytes,Ua MODERATE (*)    RBC / HPF >50 (*)  All other components within normal limits    EKG None  Radiology US OB LESS THAN 14 WEEKS WITH OB TRANSVAGINAL  Result Date: 11/28/2022 CLINICAL DATA:  Pregnancy with vaginal bleeding. LMP: 10/04/2022 corresponding to an estimated gestational age of [redacted] weeks, 6 days. EXAM: OBSTETRIC <14 WK [place] AND TRANSVAGINAL OB US TECHNIQUE: Both transabdominal and transvaginal ultrasound examinations were performed for complete evaluation of the gestation as well as the maternal uterus, adnexal regions, and pelvic cul-de-sac. Transvaginal technique was performed to assess early pregnancy.  COMPARISON:  None Available. FINDINGS: Intrauterine gestational sac: Single intrauterine gestational sac. Yolk sac:  Seen Embryo:  Present Cardiac Activity: Not detected Heart Rate: 0 bpm CRL:  11 mm   7 w   1 d                  [place] EDC: 07/16/2023 Subchorionic hemorrhage:  None visualized. Maternal uterus/adnexae: The uterus is retroflexed and demonstrates a heterogeneous echotexture. The ovaries are unremarkable. IMPRESSION: Single intrauterine pregnancy with an estimated gestational age of [redacted] weeks, 1 day. No embryonic cardiac activity identified. Electronically Signed   By: Anner Crete M.D.   On: 11/28/2022 01:45    Procedures Procedures    Medications Ordered in ED Medications - No data to display  ED Course/ Medical Decision Making/ A&P Clinical Course as of 11/28/22 0609  Wed Nov 28, 2022  0552 Patient found to have an IUP around 7 weeks without any fetal cardiac activity.  Discussed this with Dr. Elly Modena with OB/GYN.  She will help arrange follow-up with patient for definitive management.  Discussed this with patient, she is agreeable with plan.  She has no significant abdominal pain at this time.  She denies any severe bleeding.  Patient has declined pelvic exam [DW]    Clinical Course User Index [DW] Ripley Fraise, MD                           Medical Decision Making  Patient found to have an IUP without any cardiac activity.  She is in no distress.  Rh status is positive from previous labs (h/o ectopic in 2020) Discussed with OB, they will arrange management as an outpatient Patient is awake and alert, no acute distress and resting comfortably.  She is safe for discharge        Final Clinical Impression(s) / ED Diagnoses Final diagnoses:  Threatened miscarriage in early pregnancy    Rx / DC Orders ED Discharge Orders     None         Ripley Fraise, MD 11/28/22 [phone]

## 2022-11-28 NOTE — Discharge Instructions (Signed)
You should receive a phone call later this morning.  They will help arrange your treatment.  If you have any worsening abdominal pain or heavy bleeding in the next several hours please return to the ER

## 2022-11-28 NOTE — Progress Notes (Signed)
Contacted patient by phone regarding recent ED visit and ultrasound result on the morning of 11/28/22. Reviewed ultrasound findings consistent with a missed abortion. Discussed management option of expectant, medical or surgical managements. Patient reports some light vaginal bleeding over the past few days. She is interested in both expectant and medical management. Precautions reviewed and patient plans to proceed with medical management with cytotec if no heavy vaginal bleeding by this weekend. Rx cyctotec provided with instructions. Patient advised to take ibuprofen for pain management. Will plan to follow up with patient at [facility] to ensure resolution of pregnancy. Patient verbalized understanding and all questions were answered. A total of 15 minutes was spent counseling the patient

## 2022-12-02 ENCOUNTER — Inpatient Hospital Stay (HOSPITAL_COMMUNITY): Payer: Medicaid Other

## 2022-12-02 ENCOUNTER — Encounter (HOSPITAL_COMMUNITY): Payer: Self-pay | Admitting: Obstetrics and Gynecology

## 2022-12-02 ENCOUNTER — Observation Stay (HOSPITAL_COMMUNITY)
Admission: AD | Admit: 2022-12-02 | Discharge: 2022-12-03 | Disposition: A | Discharge status: Home / Self Care | Payer: Medicaid Other | Attending: Obstetrics and Gynecology | Admitting: Obstetrics and Gynecology

## 2022-12-02 ENCOUNTER — Other Ambulatory Visit: Payer: Self-pay

## 2022-12-02 DIAGNOSIS — D649 Anemia, unspecified: Secondary | ICD-10-CM | POA: Diagnosis not present

## 2022-12-02 DIAGNOSIS — Z87891 Personal history of nicotine dependence: Secondary | ICD-10-CM | POA: Diagnosis not present

## 2022-12-02 DIAGNOSIS — Z79899 Other long term (current) drug therapy: Secondary | ICD-10-CM | POA: Insufficient documentation

## 2022-12-02 DIAGNOSIS — I10 Essential (primary) hypertension: Secondary | ICD-10-CM | POA: Diagnosis not present

## 2022-12-02 DIAGNOSIS — O209 Hemorrhage in early pregnancy, unspecified: Secondary | ICD-10-CM | POA: Diagnosis not present

## 2022-12-02 LAB — CBC
HCT: 17.6 % — ABNORMAL LOW (ref 36.0–46.0)
Hemoglobin: 5.9 g/dL — CL (ref 12.0–15.0)
MCH: 27.1 pg (ref 26.0–34.0)
MCHC: 33.5 g/dL (ref 30.0–36.0)
MCV: 80.7 fL (ref 80.0–100.0)
Platelets: 394 10*3/uL (ref 150–400)
RBC: 2.18 MIL/uL — ABNORMAL LOW (ref 3.87–5.11)
RDW: 14.7 % (ref 11.5–15.5)
WBC: 11.5 10*3/uL — ABNORMAL HIGH (ref 4.0–10.5)
nRBC: 0.5 % — ABNORMAL HIGH (ref 0.0–0.2)

## 2022-12-02 LAB — HCG, QUANTITATIVE, PREGNANCY: hCG, Beta Chain, Quant, S: 7791 m[IU]/mL — ABNORMAL HIGH (ref <5)

## 2022-12-02 LAB — PREPARE RBC (CROSSMATCH)

## 2022-12-02 MED ORDER — LACTATED RINGERS IV SOLN: 125.0000 mL/h | INTRAVENOUS | Status: DC

## 2022-12-02 MED ORDER — SODIUM CHLORIDE 0.9% IV SOLUTION: Freq: Once | INTRAVENOUS | Status: AC

## 2022-12-02 MED ORDER — HYDROCODONE-ACETAMINOPHEN 5-325 MG PO TABS
2.0000 | ORAL_TABLET | ORAL | Status: DC | PRN
  Administered 2022-12-02: 2 via ORAL
  Filled 2022-12-02: qty 2

## 2022-12-02 MED ORDER — HYDROCODONE-ACETAMINOPHEN 5-325 MG PO TABS
2.0000 | ORAL_TABLET | Freq: Once | ORAL | Status: AC
  Administered 2022-12-02: 2 via ORAL
  Filled 2022-12-02: qty 2

## 2022-12-02 MED ORDER — METHYLERGONOVINE MALEATE 0.2 MG/ML IJ SOLN
0.2000 mg | Freq: Once | INTRAMUSCULAR | Status: AC
  Administered 2022-12-02: 0.2 mg via INTRAMUSCULAR
  Filled 2022-12-02: qty 1

## 2022-12-02 MED ORDER — ONDANSETRON 4 MG PO TBDP
4.0000 mg | ORAL_TABLET | Freq: Four times a day (QID) | ORAL | Status: DC | PRN
  Administered 2022-12-02: 4 mg via ORAL
  Filled 2022-12-02: qty 1

## 2022-12-02 NOTE — MAU Note (Addendum)
CRITICAL VALUE STICKER  CRITICAL VALUE: 5.9 Hgb  RECEIVER (on-site recipient of call):  [place] NOTIFIED: 1610 on 12/02/2022  MESSENGER (representative from lab): Quincy Simmonds  MD NOTIFIED: Maryelizabeth Kaufmann, CNM  TIME OF NOTIFICATION: 1610  RESPONSE: CNM aware, notifying Dr. Elgie Congo in department.

## 2022-12-02 NOTE — MAU Note (Signed)
Pt reports to mau with c/o heavy vag bleeding with golf ball sized clots since she was seen on 12/27.  Pt states she is still cramping and is "starting to feel weak"

## 2022-12-02 NOTE — H&P (Signed)
OBSC ADMISSION HISTORY AND PHYSICAL NOTE  Sabrina Larson is a 46 y.o. female [id number] admitted for transfusion of 3 units PRBCs as indicated by Hemoglobin of 5.9.   Sabrina Larson is a 46 y.o. [id number] who presented to MAU with chief complaint of heavy vaginal bleeding. She is s/p diagnosis of Missed Ab at [facility] ED on 11/28/2022. She was prescribed Cytotec, which she took that same day. Patient states she has been bleeding heavily since that time. Although her bleeding is slightly lighter today she now feels weak and continues to see blood clots about the size of her palm.    Patient reports lower abdominal pain. Pain score is 6/10. She has not taken medication for this complaint. She denies aggravating or alleviating factors.  Past Medical History: Past Medical History:  Diagnosis Date   GERD (gastroesophageal reflux disease)    Hypertension    Prediabetes     Past Surgical History: Past Surgical History:  Procedure Laterality Date   CESAREAN SECTION     2014, 1995   DILATION AND CURETTAGE OF UTERUS     8 total in office and outpatient   SKIN GRAFT     at age 46    Obstetrical History: OB History     Gravida  16   Para  6   Term  6   Preterm  0   AB  8   Living  6      SAB  0   IAB  8   Ectopic  0   Multiple  0   Live Births           Obstetric Comments  TSVD x 4. Term c-section x 2.  6 surgical TABs, 2 medicated TABs         Social History: Social History   Socioeconomic History   Marital status: Single    Spouse name: Not on file   Number of children: Not on file   Years of education: Not on file   Highest education level: Not on file  Occupational History   Not on file  Tobacco Use   Smoking status: Former    Packs/day: 0.50    Types: Cigarettes    Quit date: 07/04/2021    Years since quitting: 1.4   Smokeless tobacco: Never  Vaping Use   Vaping Use: Every day  Substance and Sexual Activity    Alcohol use: Yes    Alcohol/week: 1.0 - 2.0 standard drink of alcohol    Types: 1 - 2 Glasses of wine per week    Comment: rarely    Drug use: Yes    Types: Marijuana    Comment: daily   Sexual activity: Yes  Other Topics Concern   Not on file  Social History Narrative   Not on file   Social Determinants of Health   Financial Resource Strain: Not on file  Food Insecurity: Not on file  Transportation Needs: Not on file  Physical Activity: Not on file  Stress: Not on file  Social Connections: Not on file    Family History: Family History  Problem Relation Age of Onset   Asthma Mother    Hypertension Mother    Asthma Brother     Allergies: No Known Allergies  Medications Prior to Admission  Medication Sig Dispense Refill Last Dose   azelastine (OPTIVAR) 0.05 % ophthalmic solution Place 1 drop into both eyes 2 (two) times daily. 6 mL 12    Blood  Pressure Monitor DEVI Please provide patient with insurance approved blood pressure monitor ICD10 (I10) 1 each 0    diclofenac Sodium (VOLTAREN) 1 % GEL Apply 2 g topically 4 (four) times daily. 100 g 0    hydrochlorothiazide (HYDRODIURIL) 25 MG tablet Take 1 tablet (25 mg total) by mouth daily. 90 tablet 3    misoprostol (CYTOTEC) 200 MCG tablet Place four tablets in between your gums and cheeks (two tablets on each side) as instructed 4 tablet 1    Olopatadine HCl 0.2 % SOLN Apply 1 drop to eye daily as needed (itchy/watery eyes). 2.5 mL 5    Vitamin D, Ergocalciferol, (DRISDOL) 1.25 MG (50000 UNIT) CAPS capsule Take 1 capsule (50,000 Units total) by mouth every 7 (seven) days. 12 capsule 1      Review of Systems  Constitutional:  Positive for malaise/fatigue.  Gastrointestinal:  Positive for abdominal pain.       Vaginal bleeding, passing clots  All other systems reviewed and are negative.     Physical Exam Blood pressure 114/72, pulse 97, temperature 98.1 F (36.7 C), temperature source Oral, resp. rate 16, last  menstrual period 10/09/2022, SpO2 100 %. General appearance: alert, oriented Lungs: normal respiratory effort Heart: regular rate Extremities: No calf swelling or tenderness Pelvic exam: External genitalia normal, vaginal walls pink and well rugated, cervix visually closed, no lesions noted. Bimanual: cervix 0/thick/posterior, neg CMT, uterus nontender, no adnexa large clot protruding from cervical os, removed with fox swab x 4 and gentle application of ring forceps. Scant dark red bleeding from cervical os following removal of clot. Chaperone present  Results for orders placed or performed during the hospital encounter of 12/02/22 (from the past 24 hour(s))  CBC   Collection Time: 12/02/22  3:39 PM  Result Value Ref Range   WBC 11.5 (H) 4.0 - 10.5 K/uL   RBC 2.18 (L) 3.87 - 5.11 MIL/uL   Hemoglobin 5.9 (LL) 12.0 - 15.0 g/dL   HCT 17.6 (L) 36.0 - 46.0 %   MCV 80.7 80.0 - 100.0 fL   MCH 27.1 26.0 - 34.0 pg   MCHC 33.5 30.0 - 36.0 g/dL   RDW 14.7 11.5 - 15.5 %   Platelets 394 150 - 400 K/uL   nRBC 0.5 (H) 0.0 - 0.2 %    Patient Active Problem List   Diagnosis Date Noted   Anemia 12/02/2022   Seasonal and perennial allergic rhinoconjunctivitis 11/29/2020   Other allergic rhinitis 08/30/2020   Tobacco user 08/30/2020   Shortness of breath 08/30/2020   Allergic conjunctivitis of both eyes 08/30/2020   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 [id number] s/p Missed Ab diagnosed on 11/28/2222 --S/p home administration of Cytotec 800 mcg on 11/28/2022 --Hgb now 5.9 --Transfusion consent signed with CNM in MAU on 12/02/2022 at 1625 --Clear liquid diet per Dr. Elgie Congo --Per Dr. Elgie Congo, place in observation on [facility]  for transfusion of 3 units PRBCs  Darlina Rumpf, [place], MSN, CNM 12/02/2022, 4:35 PM

## 2022-12-03 DIAGNOSIS — O021 Missed abortion: Secondary | ICD-10-CM

## 2022-12-03 DIAGNOSIS — Z3A00 Weeks of gestation of pregnancy not specified: Secondary | ICD-10-CM

## 2022-12-03 DIAGNOSIS — D508 Other iron deficiency anemias: Secondary | ICD-10-CM | POA: Diagnosis not present

## 2022-12-03 LAB — BPAM RBC
Blood Product Expiration Date: 202401242359
Blood Product Expiration Date: 202401242359
Blood Product Expiration Date: 202401252359
ISSUE DATE / TIME: 202312311744
ISSUE DATE / TIME: 202312312013
ISSUE DATE / TIME: 202312312348
Unit Type and Rh: 7300
Unit Type and Rh: 7300
Unit Type and Rh: 7300

## 2022-12-03 LAB — TYPE AND SCREEN
ABO/RH(D): B POS
Antibody Screen: NEGATIVE
Unit division: 0
Unit division: 0
Unit division: 0

## 2022-12-03 LAB — CBC
HCT: 23.3 % — ABNORMAL LOW (ref 36.0–46.0)
Hemoglobin: 8.2 g/dL — ABNORMAL LOW (ref 12.0–15.0)
MCH: 28.2 pg (ref 26.0–34.0)
MCHC: 35.2 g/dL (ref 30.0–36.0)
MCV: 80.1 fL (ref 80.0–100.0)
Platelets: 281 10*3/uL (ref 150–400)
RBC: 2.91 MIL/uL — ABNORMAL LOW (ref 3.87–5.11)
RDW: 14.6 % (ref 11.5–15.5)
WBC: 11.9 10*3/uL — ABNORMAL HIGH (ref 4.0–10.5)
nRBC: 0.5 % — ABNORMAL HIGH (ref 0.0–0.2)

## 2022-12-03 MED ORDER — IBUPROFEN 600 MG PO TABS: 600.0000 mg | ORAL_TABLET | Freq: Four times a day (QID) | ORAL | 1 refills | Status: DC | PRN

## 2022-12-03 MED ORDER — LACTATED RINGERS IV SOLN: INTRAVENOUS | Status: DC

## 2022-12-03 MED ORDER — FERROUS SULFATE 325 (65 FE) MG PO TABS: 325.0000 mg | ORAL_TABLET | ORAL | 3 refills | Status: DC

## 2022-12-03 NOTE — Discharge Summary (Signed)
Physician Discharge Summary  Patient ID: Sabrina Larson MRN: [id number] DOB/AGE: 03/24/1976 47 y.o.  Admit date: 12/02/2022 Discharge date: 12/03/2022  Admission Diagnoses: Anemia 2/2 miscarriage bleeding  Discharge Diagnoses:  Principal Problem:   Anemia   Discharged Condition: good  Hospital Course: Pt admitted after treatment of missed abortion that was treated with cytotec.She initially had been bleeding heavily since 11/28/22.  Vaginal ultrasound showed clot within the uterus but no retained POCs.  Pt was kept due to a hemoglobin of 5.9.  Pt received 3 units of PRBC and has responded well.  She feels better s/p transfusion.  Bleeding has decreased after application of methergine x 1.  Consults: None  Significant Diagnostic Studies: labs: cbc and radiology: Ultrasound: completed SAB  Treatments: blood transfusion, 3 units  Discharge Exam: Blood pressure 109/67, pulse 66, temperature 98.2 F (36.8 C), temperature source Oral, resp. rate 16, last menstrual period 10/09/2022, SpO2 97 %. General appearance: alert, cooperative, appears stated age, and no distress Head: Normocephalic, without obvious abnormality, atraumatic Cardio: regular rate and rhythm GI: soft, non-tender; bowel sounds normal; no masses,  no organomegaly Extremities: extremities normal, atraumatic, no cyanosis or edema, Homans sign is negative, no sign of DVT, and no edema, redness or tenderness in the calves or thighs Neurologic: Grossly normal Respiratory:  Clear to auscultation bilaterally  Disposition: Discharge disposition: 01-Home or Self Care       Discharge Instructions     Call MD for:  extreme fatigue   Complete by: As directed    Call MD for:  persistant dizziness or light-headedness   Complete by: As directed    Call MD for:  persistant nausea and vomiting   Complete by: As directed    Call MD for:  severe uncontrolled pain   Complete by: As directed    Call MD for:   temperature >100.4   Complete by: As directed    Diet - low sodium heart healthy   Complete by: As directed    Increase activity slowly   Complete by: As directed       Allergies as of 12/03/2022   No Known Allergies      Medication List     STOP taking these medications    misoprostol 200 MCG tablet Commonly known as: CYTOTEC       TAKE these medications    azelastine 0.05 % ophthalmic solution Commonly known as: OPTIVAR Place 1 drop into both eyes 2 (two) times daily.   Blood Pressure Monitor Devi Please provide patient with insurance approved blood pressure monitor ICD10 (I10)   diclofenac Sodium 1 % Gel Commonly known as: Voltaren Apply 2 g topically 4 (four) times daily.   ferrous sulfate 325 (65 FE) MG tablet Commonly known as: FerrouSul Take 1 tablet (325 mg total) by mouth every other day.   hydrochlorothiazide 25 MG tablet Commonly known as: HYDRODIURIL Take 1 tablet (25 mg total) by mouth daily.   ibuprofen 600 MG tablet Commonly known as: ADVIL Take 1 tablet (600 mg total) by mouth every 6 (six) hours as needed for headache, mild pain, moderate pain or cramping.   Olopatadine HCl 0.2 % Soln Apply 1 drop to eye daily as needed (itchy/watery eyes).   Vitamin D (Ergocalciferol) 1.25 MG (50000 UNIT) Caps capsule Commonly known as: DRISDOL Take 1 capsule (50,000 Units total) by mouth every 7 (seven) days.        Follow-up [place] for [facility]. Schedule an appointment as soon as possible for a visit in 1 week(s).   Specialty: Obstetrics and Gynecology Why: SAB follow up, needs bhcg check Contact information: [place] [phone]                Signed: Griffin Basil 12/03/2022, 7:57 AM

## 2022-12-03 NOTE — Progress Notes (Signed)
Discharge instructions reviewed including patient to call tomorrow and schedule an appoint in one week, prescriptions and meds reviewed, call MD/office or return to MAU if vaginal bleeding increases (saturating pads every 1-2 hours, clots larger than a golf ball), fever > 100.4, pain increasing/uncontrolled. Patient verbalizes understanding. Verified with Dr. Elgie Congo that patient does not need a prescription for BP cuff.

## 2022-12-08 ENCOUNTER — Emergency Department (HOSPITAL_COMMUNITY)
Admission: EM | Admit: 2022-12-08 | Discharge: 2022-12-08 | Discharge status: Absent without leave | Payer: Medicaid Other | Source: Home / Self Care

## 2022-12-08 ENCOUNTER — Other Ambulatory Visit: Payer: Self-pay

## 2022-12-08 ENCOUNTER — Inpatient Hospital Stay (HOSPITAL_COMMUNITY): Payer: Medicaid Other

## 2022-12-08 ENCOUNTER — Encounter (HOSPITAL_COMMUNITY): Payer: Self-pay | Admitting: Obstetrics and Gynecology

## 2022-12-08 ENCOUNTER — Inpatient Hospital Stay (HOSPITAL_COMMUNITY)
Admission: AD | Admit: 2022-12-08 | Discharge: 2022-12-08 | Disposition: A | Discharge status: Home / Self Care | Payer: Medicaid Other | Attending: Obstetrics and Gynecology | Admitting: Obstetrics and Gynecology

## 2022-12-08 DIAGNOSIS — Z3A09 9 weeks gestation of pregnancy: Secondary | ICD-10-CM | POA: Diagnosis not present

## 2022-12-08 DIAGNOSIS — O034 Incomplete spontaneous abortion without complication: Secondary | ICD-10-CM | POA: Insufficient documentation

## 2022-12-08 DIAGNOSIS — O10911 Unspecified pre-existing hypertension complicating pregnancy, first trimester: Secondary | ICD-10-CM | POA: Diagnosis present

## 2022-12-08 DIAGNOSIS — O99011 Anemia complicating pregnancy, first trimester: Secondary | ICD-10-CM | POA: Diagnosis present

## 2022-12-08 LAB — HEMOGLOBIN AND HEMATOCRIT, BLOOD
HCT: 22.5 % — ABNORMAL LOW (ref 36.0–46.0)
Hemoglobin: 7.1 g/dL — ABNORMAL LOW (ref 12.0–15.0)

## 2022-12-08 NOTE — MAU Note (Signed)
Pt reports minimal vaginal bleeding-denies clots or clumps, or tissue. Denies feeling light headed or dizzy with ambulation.

## 2022-12-08 NOTE — MAU Note (Signed)
Sabrina Larson is a 47 y.o. at 26w2dhere in MAU reporting: was she was discharge from the hospital her bleeding was light and progressed to a period. This AM bleeding got heavier again and soaked through 2 pads in an hour and is seeing clots. This all started around noon. Clots were larger than a golf ball. Was advised to come in by on call nurse. Having some lower back pain and is feeling some pressure.   Onset of complaint: ongoing but worse  Pain score: 5/10  Vitals:   12/08/22 1438  BP: 127/76  Pulse: 92  Resp: 16  Temp: 98.9 F (37.2 C)  SpO2: 100%     FHT:NA  Lab orders placed from triage: none

## 2022-12-08 NOTE — MAU Note (Signed)
Vaginal bleeding remains scant-Dr.Ervin in to see pt-understands POC and follow up. Afebrile Ambulatory off floor for discharge.

## 2022-12-08 NOTE — MAU Note (Signed)
Pt up to bathroom - urinated-1 small stringy dark red clot noted in toilet-red blood on toilet tissue

## 2022-12-08 NOTE — MAU Provider Note (Addendum)
History     [id number]  Arrival date and time: 12/08/22 1427    Chief Complaint  Patient presents with   Back Pain   Vaginal Bleeding     HPI Sabrina Larson is a 47 y.o. at 64w2dwho presents for vaginal bleeding & back pain. Took Cytotec for a missed abortion last week.  Was admitted on New Years Eve for blood transfusion due to symptomatic anemia after completed miscarriage.  States when she was discharged she had minimal bleeding until today.  Saturated 1 pad an hour and passed several clots in the toilet.  Continues to have some lower back pain.  No fever.  OB History     Gravida  16   Para  6   Term  6   Preterm  0   AB  9   Living  6      SAB  0   IAB  9   Ectopic  0   Multiple  0   Live Births           Obstetric Comments  TSVD x 4. Term c-section x 2.  6 surgical TABs, 2 medicated TABs         Past Medical History:  Diagnosis Date   GERD (gastroesophageal reflux disease)    Hypertension    Prediabetes     Past Surgical History:  Procedure Laterality Date   CESAREAN SECTION     2014, 1995   DILATION AND CURETTAGE OF UTERUS     8 total in office and outpatient   SKIN GRAFT     at age 47   Family History  Problem Relation Age of Onset   Asthma Mother    Hypertension Mother    Asthma Brother     Allergies  Allergen Reactions   Lactose Other (See Comments)    No current facility-administered medications on file prior to encounter.   Current Outpatient Medications on File Prior to Encounter  Medication Sig Dispense Refill   ferrous sulfate (FERROUSUL) 325 (65 FE) MG tablet Take 1 tablet (325 mg total) by mouth every other day. 30 tablet 3   ibuprofen (ADVIL) 600 MG tablet Take 1 tablet (600 mg total) by mouth every 6 (six) hours as needed for headache, mild pain, moderate pain or cramping. 30 tablet 1   azelastine (OPTIVAR) 0.05 % ophthalmic solution Place 1 drop into both eyes 2 (two) times daily. 6 mL 12   Blood  Pressure Monitor DEVI Please provide patient with insurance approved blood pressure monitor ICD10 (I10) 1 each 0   diclofenac Sodium (VOLTAREN) 1 % GEL Apply 2 g topically 4 (four) times daily. 100 g 0   hydrochlorothiazide (HYDRODIURIL) 25 MG tablet Take 1 tablet (25 mg total) by mouth daily. 90 tablet 3   Vitamin D, Ergocalciferol, (DRISDOL) 1.25 MG (50000 UNIT) CAPS capsule Take 1 capsule (50,000 Units total) by mouth every 7 (seven) days. 12 capsule 1     ROS Pertinent positives and negative per HPI, all others reviewed and negative  Physical Exam   BP 121/78 (BP Location: Right Arm)   Pulse 77   Temp 98.4 F (36.9 C) (Oral)   Resp 16   Ht 5' 5.5" (1.664 m)   Wt 87.6 kg   LMP 10/09/2022 (Exact Date)   SpO2 99%   BMI 31.66 kg/m   Patient Vitals for the past 24 hrs:  BP Temp Temp src Pulse Resp SpO2 Height Weight  12/08/22 1952  121/78 98.4 F (36.9 C) Oral 77 16 -- -- --  12/08/22 1500 -- -- -- -- -- 99 % -- --  12/08/22 1449 120/71 -- -- 90 -- -- -- --  12/08/22 1438 127/76 98.9 F (37.2 C) Oral 92 16 100 % -- --  12/08/22 1434 -- -- -- -- -- -- 5' 5.5" (1.664 m) 87.6 kg    Physical Exam Vitals and nursing note reviewed. Exam conducted with a chaperone present.  Constitutional:      Appearance: Normal appearance.  HENT:     Head: Normocephalic and atraumatic.  Eyes:     General: No scleral icterus.    Conjunctiva/sclera: Conjunctivae normal.  Pulmonary:     Effort: Pulmonary effort is normal. No respiratory distress.  Genitourinary:    Comments: Pelvic: 3 cm clot removed, No active bleeding from os Neurological:     Mental Status: She is alert.  Psychiatric:        Mood and Affect: Mood normal.        Behavior: Behavior normal.      Labs Results for orders placed or performed during the hospital encounter of 12/08/22 (from the past 24 hour(s))  Hemoglobin and hematocrit, blood     Status: Abnormal   Collection Time: 12/08/22  3:15 PM  Result Value Ref  Range   Hemoglobin 7.1 (L) 12.0 - 15.0 g/dL   HCT 22.5 (L) 36.0 - 46.0 %    Imaging US PELVIS LIMITED (TRANSABDOMINAL ONLY)  Result Date: 12/08/2022 CLINICAL DATA:  [id number] Delayed or excessive hemorrhage following complete or unspecified spontaneous abortion [id number] EXAM: TRANSABDOMINAL ULTRASOUND OF PELVIS TECHNIQUE: Transabdominal and transvaginalultrasound examination of the pelvis was performed including evaluation of the uterus, ovaries, adnexal regions, and pelvic cul-de-sac. COMPARISON:  11/28/2022. FINDINGS: Uterus is diffusely myomatous measuring 10 x 8 x 6 cm. Endometrium not visualized. No intrauterine pregnancy. There is a diffusely heterogeneous appearance of the myometrium. Intrauterine clot/debris can not be excluded. Evaluation was very limited. The ovaries were not visualized. No adnexal masses or fluid collections were identified. IMPRESSION: 1. No intrauterine or ectopic pregnancy identified. 2. Diffusely heterogeneous appearance of the myometrium with suboptimal visualization of the uterine cavity and endometrium. 3. No adnexal pathology.  The ovaries were not visualized. Electronically Signed   By: Sammie Bench M.D.   On: 12/08/2022 17:28    MAU Course  Procedures Lab Orders         Hemoglobin and hematocrit, blood     No orders of the defined types were placed in this encounter.  Imaging Orders         US PELVIS LIMITED (TRANSABDOMINAL ONLY)      MDM Hemoglobin down to 7.1 from 8.2 when she was discharged last week. She is asymptomatic with stable vital signs.   Reviewed ultrasound images with Dr. Rosana Hoes - awaiting plan for management. Care turned over to [place], NP 12/08/2022 8:07 PM   Dr. Rip Harbour at bedside to discuss plan of care- message sent to surgery scheduler to schedule outpatient D&C next week. MD reviewed precautions with patient  Assessment and Plan   1. Retained products of conception after miscarriage   2. [redacted] weeks gestation  of pregnancy    -Discharge home in stable condition -Vaginal bleeding and pain precautions discussed -Patient advised to follow-up next week for surgery, will be contacted by scheduler. -Patient may return to MAU as needed or if her condition were to change or worsen  Wende Mott,  CNM 12/08/22 11:26 PM

## 2022-12-10 ENCOUNTER — Telehealth: Payer: Self-pay | Admitting: Obstetrics and Gynecology

## 2022-12-10 NOTE — Telephone Encounter (Signed)
Telephone call to patient with surgery date/time/instructions.    Posted for 12/13/22 at 2:45pm at [facility].   Instructed to arrive to entrance A by 12:30pm and be NPO.

## 2022-12-11 ENCOUNTER — Encounter (HOSPITAL_COMMUNITY): Payer: Self-pay | Admitting: Obstetrics and Gynecology

## 2022-12-12 ENCOUNTER — Encounter (HOSPITAL_COMMUNITY): Payer: Self-pay | Admitting: Obstetrics and Gynecology

## 2022-12-12 NOTE — Progress Notes (Signed)
PCP - [name], NP Cardiologist - n/a  Chest x-ray - n/a EKG - n/a Stress Test - n/a ECHO - n/a Cardiac Cath - n/a  ICD Pacemaker/Loop - n/a  Sleep Study -  n/a CPAP - none  STOP now taking any Aspirin (unless otherwise instructed by your surgeon), Aleve, Naproxen, Ibuprofen, Motrin, Advil, Goody's, BC's, all herbal medications, fish oil, and all vitamins.   Coronavirus Screening Do you have any of the following symptoms:  Cough yes/no: No Fever (>100.3F)  yes/no: No Runny nose yes/no: No Sore throat yes/no: No Difficulty breathing/shortness of breath  yes/no: No  Have you traveled in the last 14 days and where? yes/no: No  Patient verbalized understanding of instructions that were given via phone.

## 2022-12-13 ENCOUNTER — Encounter (HOSPITAL_COMMUNITY): Payer: Self-pay | Admitting: Obstetrics and Gynecology

## 2022-12-13 ENCOUNTER — Ambulatory Visit (HOSPITAL_COMMUNITY)
Admission: RE | Admit: 2022-12-13 | Discharge: 2022-12-13 | Disposition: A | Discharge status: Home / Self Care | Payer: Medicaid Other | Source: Ambulatory Visit | Attending: Obstetrics and Gynecology | Admitting: Obstetrics and Gynecology

## 2022-12-13 ENCOUNTER — Encounter (HOSPITAL_COMMUNITY)
Admission: RE | Disposition: A | Discharge status: Home / Self Care | Payer: Self-pay | Source: Ambulatory Visit | Attending: Obstetrics and Gynecology

## 2022-12-13 ENCOUNTER — Ambulatory Visit (HOSPITAL_BASED_OUTPATIENT_CLINIC_OR_DEPARTMENT_OTHER): Payer: Medicaid Other | Admitting: Registered Nurse

## 2022-12-13 ENCOUNTER — Ambulatory Visit (HOSPITAL_COMMUNITY): Payer: Medicaid Other | Admitting: Registered Nurse

## 2022-12-13 ENCOUNTER — Other Ambulatory Visit: Payer: Self-pay

## 2022-12-13 ENCOUNTER — Other Ambulatory Visit (HOSPITAL_COMMUNITY): Payer: Self-pay | Admitting: Obstetrics and Gynecology

## 2022-12-13 DIAGNOSIS — I10 Essential (primary) hypertension: Secondary | ICD-10-CM | POA: Insufficient documentation

## 2022-12-13 DIAGNOSIS — R9389 Abnormal findings on diagnostic imaging of other specified body structures: Secondary | ICD-10-CM | POA: Diagnosis not present

## 2022-12-13 DIAGNOSIS — N939 Abnormal uterine and vaginal bleeding, unspecified: Secondary | ICD-10-CM | POA: Insufficient documentation

## 2022-12-13 DIAGNOSIS — O021 Missed abortion: Secondary | ICD-10-CM | POA: Diagnosis present

## 2022-12-13 DIAGNOSIS — Z98891 History of uterine scar from previous surgery: Secondary | ICD-10-CM | POA: Insufficient documentation

## 2022-12-13 DIAGNOSIS — R7303 Prediabetes: Secondary | ICD-10-CM | POA: Insufficient documentation

## 2022-12-13 DIAGNOSIS — Z3A09 9 weeks gestation of pregnancy: Secondary | ICD-10-CM | POA: Diagnosis not present

## 2022-12-13 DIAGNOSIS — Z6830 Body mass index (BMI) 30.0-30.9, adult: Secondary | ICD-10-CM | POA: Insufficient documentation

## 2022-12-13 DIAGNOSIS — N72 Inflammatory disease of cervix uteri: Secondary | ICD-10-CM | POA: Diagnosis not present

## 2022-12-13 DIAGNOSIS — E669 Obesity, unspecified: Secondary | ICD-10-CM | POA: Diagnosis not present

## 2022-12-13 DIAGNOSIS — K219 Gastro-esophageal reflux disease without esophagitis: Secondary | ICD-10-CM | POA: Diagnosis not present

## 2022-12-13 DIAGNOSIS — O034 Incomplete spontaneous abortion without complication: Secondary | ICD-10-CM | POA: Diagnosis not present

## 2022-12-13 DIAGNOSIS — N879 Dysplasia of cervix uteri, unspecified: Secondary | ICD-10-CM | POA: Diagnosis not present

## 2022-12-13 DIAGNOSIS — N711 Chronic inflammatory disease of uterus: Secondary | ICD-10-CM | POA: Diagnosis not present

## 2022-12-13 DIAGNOSIS — Z87891 Personal history of nicotine dependence: Secondary | ICD-10-CM | POA: Diagnosis not present

## 2022-12-13 DIAGNOSIS — Z3A01 Less than 8 weeks gestation of pregnancy: Secondary | ICD-10-CM | POA: Diagnosis not present

## 2022-12-13 HISTORY — DX: Prediabetes: R73.03

## 2022-12-13 HISTORY — PX: DILATION AND EVACUATION: SHX1459

## 2022-12-13 HISTORY — PX: OPERATIVE ULTRASOUND: SHX5996

## 2022-12-13 LAB — CBC
HCT: 26.1 % — ABNORMAL LOW (ref 36.0–46.0)
Hemoglobin: 7.7 g/dL — ABNORMAL LOW (ref 12.0–15.0)
MCH: 26.2 pg (ref 26.0–34.0)
MCHC: 29.5 g/dL — ABNORMAL LOW (ref 30.0–36.0)
MCV: 88.8 fL (ref 80.0–100.0)
Platelets: 170 10*3/uL (ref 150–400)
RBC: 2.94 MIL/uL — ABNORMAL LOW (ref 3.87–5.11)
RDW: 14.8 % (ref 11.5–15.5)
WBC: 7.5 10*3/uL (ref 4.0–10.5)
nRBC: 0 % (ref 0.0–0.2)

## 2022-12-13 LAB — BASIC METABOLIC PANEL
Anion gap: 7 (ref 5–15)
BUN: 7 mg/dL (ref 6–20)
CO2: 21 mmol/L — ABNORMAL LOW (ref 22–32)
Calcium: 8.5 mg/dL — ABNORMAL LOW (ref 8.9–10.3)
Chloride: 111 mmol/L (ref 98–111)
Creatinine, Ser: 0.66 mg/dL (ref 0.44–1.00)
GFR, Estimated: >60 mL/min (ref >60)
Glucose, Bld: 87 mg/dL (ref 70–99)
Potassium: 3.6 mmol/L (ref 3.5–5.1)
Sodium: 139 mmol/L (ref 135–145)

## 2022-12-13 LAB — TYPE AND SCREEN
ABO/RH(D): B POS
Antibody Screen: NEGATIVE

## 2022-12-13 LAB — HCG, QUANTITATIVE, PREGNANCY: hCG, Beta Chain, Quant, S: 1038 m[IU]/mL — ABNORMAL HIGH (ref <5)

## 2022-12-13 SURGERY — DILATION AND EVACUATION, UTERUS
Anesthesia: General

## 2022-12-13 MED ORDER — LACTATED RINGERS IV SOLN: INTRAVENOUS | Status: DC

## 2022-12-13 MED ORDER — PHENYLEPHRINE 80 MCG/ML (10ML) SYRINGE FOR IV PUSH (FOR BLOOD PRESSURE SUPPORT)
PREFILLED_SYRINGE | INTRAVENOUS | Status: AC
  Filled 2022-12-13: qty 10

## 2022-12-13 MED ORDER — PROPOFOL 10 MG/ML IV BOLUS
INTRAVENOUS | Status: AC
  Filled 2022-12-13: qty 20

## 2022-12-13 MED ORDER — IBUPROFEN 600 MG PO TABS: 600.0000 mg | ORAL_TABLET | Freq: Four times a day (QID) | ORAL | 3 refills | Status: DC | PRN

## 2022-12-13 MED ORDER — SOD CITRATE-CITRIC ACID 500-334 MG/5ML PO SOLN
30.0000 mL | ORAL | Status: AC
  Administered 2022-12-13: 30 mL via ORAL
  Filled 2022-12-13: qty 30

## 2022-12-13 MED ORDER — PROPOFOL 10 MG/ML IV BOLUS
INTRAVENOUS | Status: DC | PRN
  Administered 2022-12-13: 200 mg via INTRAVENOUS

## 2022-12-13 MED ORDER — OXYCODONE HCL 5 MG/5ML PO SOLN: 5.0000 mg | Freq: Once | ORAL | Status: DC | PRN

## 2022-12-13 MED ORDER — PROMETHAZINE HCL 25 MG/ML IJ SOLN: 6.2500 mg | INTRAMUSCULAR | Status: DC | PRN

## 2022-12-13 MED ORDER — ONDANSETRON HCL 4 MG/2ML IJ SOLN
INTRAMUSCULAR | Status: DC | PRN
  Administered 2022-12-13: 4 mg via INTRAVENOUS

## 2022-12-13 MED ORDER — FENTANYL CITRATE (PF) 250 MCG/5ML IJ SOLN
INTRAMUSCULAR | Status: DC | PRN
  Administered 2022-12-13: 100 ug via INTRAVENOUS

## 2022-12-13 MED ORDER — ACETAMINOPHEN 500 MG PO TABS
1000.0000 mg | ORAL_TABLET | ORAL | Status: AC
  Administered 2022-12-13: 1000 mg via ORAL
  Filled 2022-12-13: qty 2

## 2022-12-13 MED ORDER — OXYCODONE HCL 5 MG PO TABS: 5.0000 mg | ORAL_TABLET | Freq: Once | ORAL | Status: DC | PRN

## 2022-12-13 MED ORDER — KETOROLAC TROMETHAMINE 30 MG/ML IJ SOLN
INTRAMUSCULAR | Status: DC | PRN
  Administered 2022-12-13: 30 mg via INTRAVENOUS

## 2022-12-13 MED ORDER — FENTANYL CITRATE (PF) 250 MCG/5ML IJ SOLN
INTRAMUSCULAR | Status: AC
  Filled 2022-12-13: qty 5

## 2022-12-13 MED ORDER — LIDOCAINE 2% (20 MG/ML) 5 ML SYRINGE
INTRAMUSCULAR | Status: DC | PRN
  Administered 2022-12-13: 60 mg via INTRAVENOUS

## 2022-12-13 MED ORDER — ONDANSETRON HCL 4 MG/2ML IJ SOLN
INTRAMUSCULAR | Status: AC
  Filled 2022-12-13: qty 2

## 2022-12-13 MED ORDER — POVIDONE-IODINE 10 % EX SWAB
2.0000 | Freq: Once | CUTANEOUS | Status: AC
  Administered 2022-12-13: 2 via TOPICAL

## 2022-12-13 MED ORDER — LIDOCAINE 2% (20 MG/ML) 5 ML SYRINGE
INTRAMUSCULAR | Status: AC
  Filled 2022-12-13: qty 5

## 2022-12-13 MED ORDER — KETOROLAC TROMETHAMINE 30 MG/ML IJ SOLN
INTRAMUSCULAR | Status: AC
  Filled 2022-12-13: qty 1

## 2022-12-13 MED ORDER — DOXYCYCLINE HYCLATE 100 MG IV SOLR
200.0000 mg | INTRAVENOUS | Status: AC
  Administered 2022-12-13: 200 mg via INTRAVENOUS
  Filled 2022-12-13: qty 200

## 2022-12-13 MED ORDER — MIDAZOLAM HCL 2 MG/2ML IJ SOLN
INTRAMUSCULAR | Status: DC | PRN
  Administered 2022-12-13: 2 mg via INTRAVENOUS

## 2022-12-13 MED ORDER — DEXAMETHASONE SODIUM PHOSPHATE 10 MG/ML IJ SOLN
INTRAMUSCULAR | Status: AC
  Filled 2022-12-13: qty 1

## 2022-12-13 MED ORDER — GLYCOPYRROLATE PF 0.2 MG/ML IJ SOSY
PREFILLED_SYRINGE | INTRAMUSCULAR | Status: DC | PRN
  Administered 2022-12-13: .1 mg via INTRAVENOUS

## 2022-12-13 MED ORDER — FENTANYL CITRATE (PF) 100 MCG/2ML IJ SOLN: 25.0000 ug | INTRAMUSCULAR | Status: DC | PRN

## 2022-12-13 MED ORDER — ORAL CARE MOUTH RINSE: 15.0000 mL | Freq: Once | OROMUCOSAL | Status: AC

## 2022-12-13 MED ORDER — CHLORHEXIDINE GLUCONATE 0.12 % MT SOLN
15.0000 mL | Freq: Once | OROMUCOSAL | Status: AC
  Administered 2022-12-13: 15 mL via OROMUCOSAL
  Filled 2022-12-13: qty 15

## 2022-12-13 MED ORDER — MIDAZOLAM HCL 2 MG/2ML IJ SOLN
INTRAMUSCULAR | Status: AC
  Filled 2022-12-13: qty 2

## 2022-12-13 MED ORDER — DEXAMETHASONE SODIUM PHOSPHATE 10 MG/ML IJ SOLN
INTRAMUSCULAR | Status: DC | PRN
  Administered 2022-12-13: 10 mg via INTRAVENOUS

## 2022-12-13 MED ORDER — PHENYLEPHRINE 80 MCG/ML (10ML) SYRINGE FOR IV PUSH (FOR BLOOD PRESSURE SUPPORT)
PREFILLED_SYRINGE | INTRAVENOUS | Status: DC | PRN
  Administered 2022-12-13: 80 ug via INTRAVENOUS

## 2022-12-13 MED ORDER — GLYCOPYRROLATE PF 0.2 MG/ML IJ SOSY
PREFILLED_SYRINGE | INTRAMUSCULAR | Status: AC
  Filled 2022-12-13: qty 1

## 2022-12-13 SURGICAL SUPPLY — 21 items
CATH ROBINSON RED A/P 16FR (CATHETERS) ×1 IMPLANT
FILTER UTR ASPR ASSEMBLY (MISCELLANEOUS) ×1 IMPLANT
GLOVE SURG ORTHO 8.0 STRL STRW (GLOVE) ×1 IMPLANT
GOWN STRL REUS W/ TWL LRG LVL3 (GOWN DISPOSABLE) ×1 IMPLANT
GOWN STRL REUS W/ TWL XL LVL3 (GOWN DISPOSABLE) ×1 IMPLANT
GOWN STRL REUS W/TWL LRG LVL3 (GOWN DISPOSABLE) ×1
GOWN STRL REUS W/TWL XL LVL3 (GOWN DISPOSABLE) ×1
HOSE CONNECTING 18IN BERKELEY (TUBING) ×1 IMPLANT
KIT BERKELEY 1ST TRI 3/8 NO TR (MISCELLANEOUS) ×1 IMPLANT
KIT BERKELEY 1ST TRIMESTER 3/8 (MISCELLANEOUS) ×1 IMPLANT
NS IRRIG 1000ML POUR BTL (IV SOLUTION) ×1 IMPLANT
PACK VAGINAL MINOR WOMEN LF (CUSTOM PROCEDURE TRAY) ×1 IMPLANT
PAD OB MATERNITY 4.3X12.25 (PERSONAL CARE ITEMS) ×1 IMPLANT
SET BERKELEY SUCTION TUBING (SUCTIONS) ×1 IMPLANT
SPIKE FLUID TRANSFER (MISCELLANEOUS) ×1 IMPLANT
TOWEL GREEN STERILE FF (TOWEL DISPOSABLE) ×2 IMPLANT
UNDERPAD 30X36 HEAVY ABSORB (UNDERPADS AND DIAPERS) ×1 IMPLANT
VACURETTE 10 RIGID CVD (CANNULA) IMPLANT
VACURETTE 7MM CVD STRL WRAP (CANNULA) IMPLANT
VACURETTE 8 RIGID CVD (CANNULA) IMPLANT
VACURETTE 9 RIGID CVD (CANNULA) IMPLANT

## 2022-12-13 NOTE — Anesthesia Postprocedure Evaluation (Signed)
Anesthesia Post Note  Patient: Engineer, technical sales  Procedure(s) Performed: DILATATION AND EVACUATION OPERATIVE ULTRASOUND     Patient location during evaluation: PACU Anesthesia Type: General Level of consciousness: awake and alert, oriented and patient cooperative Pain management: pain level controlled Vital Signs Assessment: post-procedure vital signs reviewed and stable Respiratory status: spontaneous breathing, nonlabored ventilation and respiratory function stable Cardiovascular status: blood pressure returned to baseline and stable Postop Assessment: no apparent nausea or vomiting Anesthetic complications: no   There were no known notable events for this encounter.  Last Vitals:  Vitals:   12/13/22 1632 12/13/22 1645  BP: 118/76 118/76  Pulse: 63 62  Resp: 15 15  Temp: 36.4 C   SpO2:  97%    Last Pain:  Vitals:   12/13/22 1632  TempSrc:   PainSc: 0-No pain                 Pervis Hocking

## 2022-12-13 NOTE — Anesthesia Preprocedure Evaluation (Addendum)
Anesthesia Evaluation  Patient identified by MRN, date of birth, ID band Patient awake    Reviewed: Allergy & Precautions, NPO status , Patient's Chart, lab work & pertinent test results  History of Anesthesia Complications Negative for: history of anesthetic complications  Airway Mallampati: II  TM Distance: >3 FB Neck ROM: Full    Dental  (+) Dental Advisory Given, Caps   Pulmonary Current Smoker and Patient abstained from smoking.   Pulmonary exam normal        Cardiovascular hypertension (noncompliant), Normal cardiovascular exam     Neuro/Psych negative neurological ROS  negative psych ROS   GI/Hepatic ,GERD  Controlled,,(+)     substance abuse  marijuana use  Endo/Other   Obesity Pre-DM   Renal/GU negative Renal ROS     Musculoskeletal negative musculoskeletal ROS (+)    Abdominal   Peds  Hematology  (+) Blood dyscrasia, anemia   Anesthesia Other Findings   Reproductive/Obstetrics  Retained POC                              Anesthesia Physical Anesthesia Plan  ASA: 2  Anesthesia Plan: General   Post-op Pain Management: Tylenol PO (pre-op)*   Induction: Intravenous  PONV Risk Score and Plan: 2 and Treatment may vary due to age or medical condition, Ondansetron, Dexamethasone and Midazolam  Airway Management Planned: LMA  Additional Equipment: None  Intra-op Plan:   Post-operative Plan: Extubation in OR  Informed Consent: I have reviewed the patients History and Physical, chart, labs and discussed the procedure including the risks, benefits and alternatives for the proposed anesthesia with the patient or authorized representative who has indicated his/her understanding and acceptance.     Dental advisory given  Plan Discussed with: CRNA and Anesthesiologist  Anesthesia Plan Comments:        Anesthesia Quick Evaluation

## 2022-12-13 NOTE — Transfer of Care (Signed)
Immediate Anesthesia Transfer of Care Note  Patient: Sabrina Larson  Procedure(s) Performed: DILATATION AND EVACUATION OPERATIVE ULTRASOUND  Patient Location: PACU  Anesthesia Type:General  Level of Consciousness: drowsy and patient cooperative  Airway & Oxygen Therapy: Patient connected to face mask oxygen  Post-op Assessment: Report given to RN and Post -op Vital signs reviewed and stable  Post vital signs: Reviewed and stable  Last Vitals:  Vitals Value Taken Time  BP 137/82 12/13/22 1632  Temp    Pulse 67 12/13/22 1633  Resp 8 12/13/22 1633  SpO2 100 % 12/13/22 1633  Vitals shown include unvalidated device data.  Last Pain:  Vitals:   12/13/22 1306  TempSrc:   PainSc: 3       Patients Stated Pain Goal: 0 (18/34/37 3578)  Complications: There were no known notable events for this encounter.

## 2022-12-13 NOTE — Op Note (Signed)
Sabrina Larson PROCEDURE DATE: 12/13/2022  PREOPERATIVE DIAGNOSIS: potential retained products of conception POSTOPERATIVE DIAGNOSIS: normal endometrial stripe PROCEDURE:     Dilation and curettage SURGEON:  Lynnda Shields  INDICATIONS: 47 y.o. [id number] treated for MAB with cytotec.  Pt have heavy vaginal bleeding necessitating 3 units of PRBC.  On follow up ultrasound was still suggestive of possible retained products of conception versus clot.  Risks of surgery were discussed with the patient including but not limited to: bleeding which may require transfusion; infection which may require antibiotics; injury to uterus or surrounding organs; need for additional procedures including laparotomy or laparoscopy; possibility of intrauterine scarring which may impair future fertility; and other postoperative/anesthesia complications. Written informed consent was obtained.    FINDINGS:  A 9 week size uterus, thin endometrial stripe, 8 mm, no flow in the endometrium, no retained products of conception  ANESTHESIA: General-LMA,. INTRAVENOUS FLUIDS:  500 ml of LR ESTIMATED BLOOD LOSS:  Less than 100 ml. SPECIMENS:  endometrial curettings sent COMPLICATIONS:  None immediate.  PROCEDURE DETAILS:  The patient received intravenous Doxycycline while in the preoperative area.  She was then taken to the operating room where general anesthesia was administered and was found to be adequate.  After an adequate timeout was performed, she was placed in the dorsal lithotomy position and examined; then prepped and draped in the sterile manner.  Pt had voided prior to entering the OR A vaginal speculum was then placed in the patient's vagina and a single tooth tenaculum was applied to the anterior lip of the cervix.  The uterus was sounded to 8.5 cm.  Intraoperative transabdominal ultrasound was performed which showed a thin endometrial stripe and no obvious products of conception.  A gentle sharp curettage was  performed until a gritty texture was felt throughout the cavity.  Minimal specimen was retrieved. There was minimal bleeding noted and the tenaculum removed with good hemostasis noted.   All instruments were removed from the patient's vagina.  Sponge and instrument counts were correct times two  The patient tolerated the procedure well and was taken to the recovery area extubated, awake, and in stable condition.  The patient will be discharged to home as per PACU criteria.  Routine postoperative instructions given.  She was ibuprofen.  She will follow up in the office in 2-3 weeks for postoperative evaluation  Lynnda Shields, MD, [place], [facility] for [place]

## 2022-12-13 NOTE — H&P (Signed)
OB/GYN Pre-Op History and Physical  Sabrina  Larson is a 47 y.o. [id number] presenting for dilation and curettage of the uterus due to potential retained products of conception with thickened endometrial stripe.  Pt had management of miscarriage with cytotec and has been admitted at least once for heavy bleeding.  Pt had been previously readmitted for anemia and received 3 units of PRBC.  On follow up endometrial stripe remained thickened.       Past Medical History:  Diagnosis Date   COVID 10/2022   resolved   GERD (gastroesophageal reflux disease)    Hypertension    Pre-diabetes     Past Surgical History:  Procedure Laterality Date   CESAREAN SECTION     2014, 1995   DILATION AND CURETTAGE OF UTERUS     8 total in office and outpatient   SKIN GRAFT     at age 79    OB History  Gravida Para Term Preterm AB Living  '16 6 6 '$ 0 9 6  SAB IAB Ectopic Multiple Live Births  0 9 0 0      # Outcome Date GA Lbr Len/2nd Weight Sex Delivery Anes PTL Lv  16 Current           15 Term 2014     CS-LTranv     14 Term 2011     VBAC     13 Term 2006     VBAC     12 Term 1999     VBAC     62 Term 1997     VBAC     10 Term 23     CS-LTranv     9 IAB           8 IAB           7 IAB           6 IAB           5 IAB           4 IAB           3 IAB           2 IAB           1 IAB             Obstetric Comments  TSVD x 4. Term c-section x 2.   6 surgical TABs, 2 medicated TABs    Social History   Socioeconomic History   Marital status: Single    Spouse name: Not on file   Number of children: Not on file   Years of education: Not on file   Highest education level: Not on file  Occupational History   Not on file  Tobacco Use   Smoking status: Former    Packs/day: 0.50    Types: Cigarettes    Quit date: 07/04/2021    Years since quitting: 1.4   Smokeless tobacco: Never  Vaping Use   Vaping Use: Every day   Substances: Flavoring  Substance and Sexual Activity    Alcohol use: Yes    Comment: rarely 1-2 glasses per year   Drug use: Yes    Types: Marijuana    Comment: daily - last today 12/11/22   Sexual activity: Not Currently    Birth control/protection: Other-see comments    Comment: Hx MAB - retainedproducts of conceptions  Other Topics Concern   Not on file  Social History Narrative   Not on file  Social Determinants of Health   Financial Resource Strain: Not on file  Food Insecurity: No Food Insecurity (12/02/2022)   Hunger Vital Sign    Worried About Running Out of Food in the Last Year: Never true    Ran Out of Food in the Last Year: Never true  Transportation Needs: No Transportation Needs (12/02/2022)   PRAPARE - Hydrologist (Medical): No    Lack of Transportation (Non-Medical): No  Physical Activity: Not on file  Stress: Not on file  Social Connections: Not on file    Family History  Problem Relation Age of Onset   Asthma Mother    Hypertension Mother    Asthma Brother     Medications Prior to Admission  Medication Sig Dispense Refill Last Dose   ferrous sulfate (FERROUSUL) 325 (65 FE) MG tablet Take 1 tablet (325 mg total) by mouth every other day. 30 tablet 3 12/12/2022   ibuprofen (ADVIL) 600 MG tablet Take 1 tablet (600 mg total) by mouth every 6 (six) hours as needed for headache, mild pain, moderate pain or cramping. 30 tablet 1 Past Week   Blood Pressure Monitor DEVI Please provide patient with insurance approved blood pressure monitor ICD10 (I10) 1 each 0    diclofenac Sodium (VOLTAREN) 1 % GEL Apply 2 g topically 4 (four) times daily. (Patient not taking: Reported on 12/12/2022) 100 g 0 Not Taking   hydrochlorothiazide (HYDRODIURIL) 25 MG tablet Take 1 tablet (25 mg total) by mouth daily. (Patient not taking: Reported on 12/12/2022) 90 tablet 3 Not Taking   Vitamin D, Ergocalciferol, (DRISDOL) 1.25 MG (50000 UNIT) CAPS capsule Take 1 capsule (50,000 Units total) by mouth every 7 (seven)  days. (Patient not taking: Reported on 12/12/2022) 12 capsule 1 Not Taking    Allergies  Allergen Reactions   Lactose Other (See Comments)    Gi-upset    Review of Systems: Negative except for what is mentioned in HPI.     Physical Exam: BP (!) 117/98   Pulse 80   Temp 98.7 F (37.1 C) (Oral)   Resp 18   Ht '5\' 5"'$  (1.651 m)   Wt 85.3 kg   LMP 10/09/2022 (Exact Date)   SpO2 100%   BMI 31.28 kg/m  CONSTITUTIONAL: Well-developed, well-nourished female in no acute distress.  HENT:  Normocephalic, atraumatic, External right and left ear normal. Oropharynx is clear and moist EYES: Conjunctivae and EOM are normal.  NECK: Normal range of motion, supple, no masses SKIN: Skin is warm and dry. No rash noted. Not diaphoretic. No erythema. No pallor. [place]: Alert and oriented to person, place, and time. Normal reflexes, muscle tone coordination. No cranial nerve deficit noted. PSYCHIATRIC: Normal mood and affect. Normal behavior. Normal judgment and thought content. CARDIOVASCULAR: Normal heart rate noted, regular rhythm RESPIRATORY: Effort and breath sounds normal, no problems with respiration noted ABDOMEN: Soft, nontender, nondistended, Well-healed Pfannenstiel incision. PELVIC: Deferred MUSCULOSKELETAL: Normal range of motion. No edema and no tenderness. 2+ distal pulses.   Pertinent Labs/Studies:   Results for orders placed or performed during the hospital encounter of 12/13/22 (from the past 72 hour(s))  Type and screen     Status: None   Collection Time: 12/13/22  1:25 PM  Result Value Ref Range   ABO/RH(D) B POS    Antibody Screen NEG    Sample Expiration      12/16/2022,2359 Performed at [facility], [place] [address] [place], [place] [address]   CBC  Status: Abnormal   Collection Time: 12/13/22  1:30 PM  Result Value Ref Range   WBC 7.5 4.0 - 10.5 K/uL   RBC 2.94 (L) 3.87 - 5.11 MIL/uL   Hemoglobin 7.7 (L) 12.0 - 15.0 g/dL   HCT 26.1 (L) 36.0 - 46.0 %    MCV 88.8 80.0 - 100.0 fL   MCH 26.2 26.0 - 34.0 pg   MCHC 29.5 (L) 30.0 - 36.0 g/dL   RDW 14.8 11.5 - 15.5 %   Platelets 170 150 - 400 K/uL   nRBC 0.0 0.0 - 0.2 %    Comment: Performed at [facility], [place] [address], [place], [place] [address]  hCG, quantitative, pregnancy     Status: Abnormal   Collection Time: 12/13/22  1:30 PM  Result Value Ref Range   hCG, Beta Chain, Quant, S 1,038 (H) <5 mIU/mL    Comment:          GEST. AGE      CONC.  (mIU/mL)   <=1 WEEK        5 - 50     2 WEEKS       50 - 500     3 WEEKS       100 - 10,000     4 WEEKS     1,000 - 30,000     5 WEEKS     3,500 - 115,000   6-8 WEEKS     12,000 - 270,000    12 WEEKS     15,000 - 220,000        FEMALE AND NON-PREGNANT FEMALE:     LESS THAN 5 mIU/mL Performed at [facility], [place] [address], [place], [place] [address]   Basic metabolic panel per protocol     Status: Abnormal   Collection Time: 12/13/22  1:30 PM  Result Value Ref Range   Sodium 139 135 - 145 mmol/L   Potassium 3.6 3.5 - 5.1 mmol/L   Chloride 111 98 - 111 mmol/L   CO2 21 (L) 22 - 32 mmol/L   Glucose, Bld 87 70 - 99 mg/dL    Comment: Glucose reference range applies only to samples taken after fasting for at least 8 hours.   BUN 7 6 - 20 mg/dL   Creatinine, Ser 0.66 0.44 - 1.00 mg/dL   Calcium 8.5 (L) 8.9 - 10.3 mg/dL   GFR, Estimated >60 >60 mL/min    Comment: (NOTE) Calculated using the CKD-EPI Creatinine Equation (2021)    Anion gap 7 5 - 15    Comment: Performed at [place] [address], [place], [place] [address]       Assessment and Plan :Sabrina Larson is a 47 y.o. [id number] here for dilation and curettage for possible retained products of conception.  We will attempt to have realtime ultrasound for guidance during the procedure.  Risks of the procedure have been given including bleeding, infection, involvement of other organs as well as uterine perforation..   Plan for dilation and curettage  with ultrasound guidance NPO Admission labs ordered VS Q4    Lynnda Shields, M.D. Attending [place], [facility] for [employer], [place]

## 2022-12-13 NOTE — Anesthesia Procedure Notes (Signed)
Procedure Name: LMA Insertion Date/Time: 12/13/2022 3:50 PM  Performed by: Ester Rink, CRNAPre-anesthesia Checklist: Patient identified, Emergency Drugs available, Suction available and Patient being monitored Patient Re-evaluated:Patient Re-evaluated prior to induction Oxygen Delivery Method: Circle system utilized Preoxygenation: Pre-oxygenation with 100% oxygen Induction Type: IV induction Ventilation: Mask ventilation without difficulty LMA: LMA inserted LMA Size: 4.0 Tube type: Oral Number of attempts: 1 Placement Confirmation: positive ETCO2 and breath sounds checked- equal and bilateral Tube secured with: Tape Dental Injury: Teeth and Oropharynx as per pre-operative assessment

## 2022-12-14 ENCOUNTER — Encounter (HOSPITAL_COMMUNITY): Payer: Self-pay | Admitting: Obstetrics and Gynecology

## 2022-12-17 LAB — SURGICAL PATHOLOGY

## 2022-12-19 ENCOUNTER — Other Ambulatory Visit: Payer: Self-pay | Admitting: *Deleted

## 2022-12-19 ENCOUNTER — Encounter: Payer: Self-pay | Admitting: *Deleted

## 2022-12-19 DIAGNOSIS — O034 Incomplete spontaneous abortion without complication: Secondary | ICD-10-CM

## 2022-12-19 MED ORDER — DOXYCYCLINE HYCLATE 100 MG PO CAPS: 100.0000 mg | ORAL_CAPSULE | Freq: Two times a day (BID) | ORAL | 0 refills | Status: AC

## 2022-12-19 NOTE — Progress Notes (Signed)
Advised pt of surgical pathology results and RX for doxycycline 100 mg po BID X 10 days. Pt verbalized understanding. RX sent to pharmacy of choice.

## 2022-12-28 ENCOUNTER — Ambulatory Visit: Payer: Medicaid Other | Admitting: Obstetrics & Gynecology

## 2023-01-02 ENCOUNTER — Ambulatory Visit: Payer: Medicaid Other | Admitting: Obstetrics and Gynecology

## 2023-01-20 ENCOUNTER — Other Ambulatory Visit (HOSPITAL_BASED_OUTPATIENT_CLINIC_OR_DEPARTMENT_OTHER): Payer: Self-pay

## 2023-01-20 ENCOUNTER — Ambulatory Visit (HOSPITAL_BASED_OUTPATIENT_CLINIC_OR_DEPARTMENT_OTHER)
Admission: RE | Admit: 2023-01-20 | Discharge: 2023-01-20 | Disposition: A | Discharge status: Home / Self Care | Payer: Medicaid Other | Source: Ambulatory Visit | Attending: Nurse Practitioner | Admitting: Nurse Practitioner

## 2023-01-20 DIAGNOSIS — Z1231 Encounter for screening mammogram for malignant neoplasm of breast: Secondary | ICD-10-CM

## 2023-01-22 ENCOUNTER — Other Ambulatory Visit: Payer: Self-pay | Admitting: Nurse Practitioner

## 2023-01-22 DIAGNOSIS — R928 Other abnormal and inconclusive findings on diagnostic imaging of breast: Secondary | ICD-10-CM

## 2023-02-05 ENCOUNTER — Ambulatory Visit: Payer: No Typology Code available for payment source

## 2023-02-05 ENCOUNTER — Ambulatory Visit
Admission: RE | Admit: 2023-02-05 | Discharge: 2023-02-05 | Disposition: A | Discharge status: Home / Self Care | Payer: No Typology Code available for payment source | Source: Ambulatory Visit | Attending: Nurse Practitioner | Admitting: Nurse Practitioner

## 2023-02-05 DIAGNOSIS — R928 Other abnormal and inconclusive findings on diagnostic imaging of breast: Secondary | ICD-10-CM

## 2023-02-15 ENCOUNTER — Ambulatory Visit: Payer: No Typology Code available for payment source | Admitting: Nurse Practitioner

## 2023-03-18 ENCOUNTER — Encounter: Payer: Self-pay | Admitting: *Deleted

## 2023-09-07 DIAGNOSIS — Z6832 Body mass index (BMI) 32.0-32.9, adult: Secondary | ICD-10-CM | POA: Diagnosis not present

## 2023-09-07 DIAGNOSIS — R0602 Shortness of breath: Secondary | ICD-10-CM | POA: Diagnosis not present

## 2023-09-07 DIAGNOSIS — Z79899 Other long term (current) drug therapy: Secondary | ICD-10-CM | POA: Diagnosis not present

## 2023-09-27 ENCOUNTER — Ambulatory Visit: Payer: Self-pay | Admitting: *Deleted

## 2023-09-27 ENCOUNTER — Telehealth: Payer: Medicaid Other | Admitting: Physician Assistant

## 2023-09-27 DIAGNOSIS — T3695XA Adverse effect of unspecified systemic antibiotic, initial encounter: Secondary | ICD-10-CM | POA: Diagnosis not present

## 2023-09-27 DIAGNOSIS — B379 Candidiasis, unspecified: Secondary | ICD-10-CM

## 2023-09-27 DIAGNOSIS — K047 Periapical abscess without sinus: Secondary | ICD-10-CM

## 2023-09-27 MED ORDER — FLUCONAZOLE 150 MG PO TABS: 150.0000 mg | ORAL_TABLET | ORAL | 0 refills | Status: DC | PRN

## 2023-09-27 MED ORDER — AMOXICILLIN 500 MG PO CAPS: 500.0000 mg | ORAL_CAPSULE | Freq: Three times a day (TID) | ORAL | 0 refills | Status: AC

## 2023-09-27 MED ORDER — IBUPROFEN 800 MG PO TABS: 800.0000 mg | ORAL_TABLET | Freq: Three times a day (TID) | ORAL | 0 refills | Status: DC | PRN

## 2023-09-27 NOTE — Telephone Encounter (Signed)
Call was disconnected during triage- attempted to call patient- did not get answer- left message on VM to call office

## 2023-09-27 NOTE — Progress Notes (Signed)
Virtual Visit Consent   Sabrina Larson, you are scheduled for a virtual visit with a [facility] provider today. Just as with appointments in the office, your consent must be obtained to participate. Your consent will be active for this visit and any virtual visit you may have with one of our providers in the next 365 days. If you have a MyChart account, a copy of this consent can be sent to you electronically.  As this is a virtual visit, video technology does not allow for your provider to perform a traditional examination. This may limit your provider's ability to fully assess your condition. If your provider identifies any concerns that need to be evaluated in person or the need to arrange testing (such as labs, EKG, etc.), we will make arrangements to do so. Although advances in technology are sophisticated, we cannot ensure that it will always work on either your end or our end. If the connection with a video visit is poor, the visit may have to be switched to a telephone visit. With either a video or telephone visit, we are not always able to ensure that we have a secure connection.  By engaging in this virtual visit, you consent to the provision of healthcare and authorize for your insurance to be billed (if applicable) for the services provided during this visit. Depending on your insurance coverage, you may receive a charge related to this service.  I need to obtain your verbal consent now. Are you willing to proceed with your visit today? Sabrina Larson has provided verbal consent on 09/27/2023 for a virtual visit (video or telephone). Margaretann Loveless, PA-C  Date: 09/27/2023 5:17 PM  Virtual Visit via Video Note   I, Margaretann Loveless, connected with  Sabrina Larson  ([id number], 1976-08-06) on 09/27/23 at  5:15 PM EDT by a video-enabled telemedicine application and verified that I am speaking with the correct person using two  identifiers.  Location: Patient: Virtual Visit Location Patient: Home Provider: Virtual Visit Location Provider: Home Office   I discussed the limitations of evaluation and management by telemedicine and the availability of in person appointments. The patient expressed understanding and agreed to proceed.    History of Present Illness: Sabrina Larson is a 47 y.o. who identifies as a female who was assigned female at birth, and is being seen today for tooth pain.  HPI: Dental Pain  This is a new problem. The current episode started 1 to 4 weeks ago (off and on, worsened yesterday). The problem occurs constantly. The problem has been gradually worsening. The pain is severe. Associated symptoms include facial pain, sinus pressure (on right and radiates to the ear) and thermal sensitivity. Pertinent negatives include no fever or oral bleeding. Associated symptoms comments: Headaches. Treatments tried: salt water gargles, Ibuprofen 600mg , orajel. The treatment provided no relief.   Broke filling.    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   Ectopic pregnancy 03/19/2019   History of gastric ulcer 12/24/2017   GERD (gastroesophageal reflux disease) 01/22/2017   Hypertension 07/05/2016   Prediabetes 05/02/2016    Allergies:  Allergies  Allergen Reactions   Lactose Other (See Comments)    Gi-upset   Medications:  Current Outpatient Medications:    amoxicillin (AMOXIL) 500 MG capsule, Take 1 capsule (500 mg total) by mouth 3 (three) times daily for 10 days., Disp: 30 capsule, Rfl: 0   fluconazole (DIFLUCAN) 150 MG tablet, Take 1 tablet (150 mg total) by mouth every 3 (three) days as needed.,  Disp: 3 tablet, Rfl: 0   ibuprofen (ADVIL) 800 MG tablet, Take 1 tablet (800 mg total) by mouth every 8 (eight) hours as needed., Disp: 30 tablet, Rfl: 0   ferrous sulfate (FERROUSUL) 325 (65 FE) MG tablet, Take 1 tablet (325 mg total) by mouth every other day., Disp: 30 tablet, Rfl: 3  Observations/Objective: Patient is well-developed, well-nourished in no acute distress.  Resting comfortably at home.  Head is normocephalic, atraumatic.  No labored breathing.  Speech is clear and coherent with logical content.  Patient is alert and oriented at baseline.    Assessment and Plan: 1. Dental infection - amoxicillin (AMOXIL) 500 MG capsule; Take 1 capsule (500 mg total) by mouth 3 (three) times daily for 10 days.  Dispense: 30 capsule; Refill: 0 - ibuprofen (ADVIL) 800 MG tablet; Take 1 tablet (800 mg total) by mouth every 8 (eight) hours as needed.  Dispense: 30 tablet; Refill: 0  2. Antibiotic-induced yeast infection - fluconazole (DIFLUCAN) 150 MG tablet; Take 1 tablet (150 mg total) by mouth every 3 (three) days as needed.  Dispense: 3 tablet; Refill: 0  - Suspected infection with broken tooth - Amoxicillin and ibuprofen prescribed - Can use ice on outside jaw/cheek for swelling - Can also take tylenol for pain with other medications - Discussed DenTemp putty that can be used to cover a broken tooth - Schedule a follow with a dentist as soon as possible (Can contact [facility] if underinsured or uninsured) - Seek in person evaluation if symptoms fail to improve or if they worsen   Follow Up Instructions: I discussed the assessment and treatment plan with the patient. The patient was provided an opportunity to ask questions and all were answered. The patient agreed with the plan and demonstrated an understanding of the instructions.  A copy of instructions were sent to the patient via MyChart unless otherwise noted below.    The patient was advised to call back or seek  an in-person evaluation if the symptoms worsen or if the condition fails to improve as anticipated.    Margaretann Loveless, PA-C

## 2023-09-27 NOTE — Patient Instructions (Signed)
Sabrina Larson, thank you for joining Margaretann Loveless, PA-C for today's virtual visit.  While this provider is not your primary care provider (PCP), if your PCP is located in our provider database this encounter information will be shared with them immediately following your visit.   A [facility] MyChart account gives you access to today's visit and all your visits, tests, and labs performed at [facility] " click here if you don't have a [facility] MyChart account or go to [email or web address]  Consent: (Patient) Sabrina Larson provided verbal consent for this virtual visit at the beginning of the encounter.  Current Medications:  Current Outpatient Medications:    amoxicillin (AMOXIL) 500 MG capsule, Take 1 capsule (500 mg total) by mouth 3 (three) times daily for 10 days., Disp: 30 capsule, Rfl: 0   fluconazole (DIFLUCAN) 150 MG tablet, Take 1 tablet (150 mg total) by mouth every 3 (three) days as needed., Disp: 3 tablet, Rfl: 0   ibuprofen (ADVIL) 800 MG tablet, Take 1 tablet (800 mg total) by mouth every 8 (eight) hours as needed., Disp: 30 tablet, Rfl: 0   ferrous sulfate (FERROUSUL) 325 (65 FE) MG tablet, Take 1 tablet (325 mg total) by mouth every other day., Disp: 30 tablet, Rfl: 3   Medications ordered in this encounter:  Meds ordered this encounter  Medications   amoxicillin (AMOXIL) 500 MG capsule    Sig: Take 1 capsule (500 mg total) by mouth 3 (three) times daily for 10 days.    Dispense:  30 capsule    Refill:  0    Order Specific Question:   Supervising Provider    Answer:   Merrilee Jansky [id number]   ibuprofen (ADVIL) 800 MG tablet    Sig: Take 1 tablet (800 mg total) by mouth every 8 (eight) hours as needed.    Dispense:  30 tablet    Refill:  0    Order Specific Question:   Supervising Provider    Answer:   Merrilee Jansky [[id number]]   fluconazole (DIFLUCAN) 150 MG tablet    Sig: Take 1 tablet (150 mg total) by mouth  every 3 (three) days as needed.    Dispense:  3 tablet    Refill:  0    Order Specific Question:   Supervising Provider    Answer:   Merrilee Jansky [id number]     *If you need refills on other medications prior to your next appointment, please contact your pharmacy*  Follow-Up: Call back or seek an in-person evaluation if the symptoms worsen or if the condition fails to improve as anticipated.  [facility] [phone]  Other Instructions DenTemp to cover broken filling   Dental Abscess  A dental abscess is an infection around a tooth that may involve pain, swelling, and a collection of pus, as well as other symptoms. Treatment is important to help with symptoms and to prevent the infection from spreading. The general types of dental abscesses are: Pulpal abscess. This abscess may form from the inner part of the tooth (pulp). Periodontal abscess. This abscess may form from the gum. What are the causes? This condition is caused by a bacterial infection in or around the tooth. It may result from: Severe tooth decay (cavities). Trauma to the tooth, such as a broken or chipped tooth. What increases the risk? This condition is more likely to develop in males. It is also more likely to develop in people who: Have cavities.  Have severe gum disease. Eat sugary snacks between meals. Use tobacco products. Have diabetes. Have a weakened disease-fighting system (immune system). Do not brush and care for their teeth regularly. What are the signs or symptoms? Mild symptoms of this condition include: Tenderness. Bad breath. Fever. A bitter taste in the mouth. Pain in and around the infected tooth. Moderate symptoms of this condition include: Swollen neck glands. Chills. Pus drainage. Swelling and redness around the infected tooth, in the mouth, or in the face. Severe pain in and around the infected tooth. Severe symptoms of this condition include: Difficulty  swallowing. Difficulty opening the mouth. Nausea. Vomiting. How is this diagnosed? This condition is diagnosed based on: Your symptoms and your medical and dental history. An examination of the infected tooth. During the exam, your dental care provider may tap on the infected tooth. You may also need to have X-rays taken of the affected area. How is this treated? This condition is treated by getting rid of the infection. This may be done with: Antibiotic medicines. These may be used in certain situations. Antibacterial mouth rinse. Incision and drainage. This procedure is done by making an incision in the abscess to drain out the pus. Removing pus is the first priority in treating an abscess. A root canal. This may be performed to save the tooth. Your dental care provider accesses the visible part of your tooth (crown) with a drill and removes any infected pulp. Then the space is filled and sealed off. Tooth extraction. The tooth is pulled out if it cannot be saved by other treatment. You may also receive treatment for pain, such as: Acetaminophen or NSAIDs. Gels that contain a numbing medicine. An injection to block the pain near your nerve. Follow these instructions at home: Medicines Take over-the-counter and prescription medicines only as told by your dental care provider. If you were prescribed an antibiotic, take it as told by your dental care provider. Do not stop taking the antibiotic even if you start to feel better. If you were prescribed a gel that contains a numbing medicine, use it exactly as told in the directions. Do not use these gels for children who are younger than 71 years of age. Use an antibacterial mouth rinse as told by your dental care provider. General instructions  Gargle with a mixture of salt and water 3-4 times a day or as needed. To make salt water, completely dissolve -1 tsp (3-6 g) of salt in 1 cup (237 mL) of warm water. Eat a soft diet while your  abscess is healing. Drink enough fluid to keep your urine pale yellow. Do not apply heat to the outside of your mouth. Do not use any products that contain nicotine or tobacco. These products include cigarettes, chewing tobacco, and vaping devices, such as e-cigarettes. If you need help quitting, ask your dental care provider. Keep all follow-up visits. This is important. How is this prevented?  Excellent dental home care, which includes brushing your teeth every morning and night with fluoride toothpaste. Floss one time each day. Get regularly scheduled dental cleanings. Consider having a dental sealant applied on teeth that have deep grooves to prevent cavities. Drink fluoridated water regularly. This includes most tap water. Check the label on bottled water to see if it contains fluoride. Reduce or eliminate sugary drinks. Eat healthy meals and snacks. Wear a mouth guard or face shield to protect your teeth while playing sports. Contact a health care provider if: Your pain is worse and is  not helped by medicine. You have swelling. You see pus around the tooth. You have a fever or chills. Get help right away if: Your symptoms suddenly get worse. You have a very bad headache. You have problems breathing or swallowing. You have trouble opening your mouth. You have swelling in your neck or around your eye. These symptoms may represent a serious problem that is an emergency. Do not wait to see if the symptoms will go away. Get medical help right away. Call your local emergency services (911 in the [place] Do not drive yourself to the hospital. Summary A dental abscess is a collection of pus in or around a tooth that results from an infection. A dental abscess may result from severe tooth decay, trauma to the tooth, or severe gum disease around a tooth. Symptoms include severe pain, swelling, redness, and drainage of pus in and around the infected tooth. The first priority in treating a  dental abscess is to drain out the pus. Treatment may also involve removing damage inside the tooth (root canal) or extracting the tooth. This information is not intended to replace advice given to you by your health care provider. Make sure you discuss any questions you have with your health care provider. Document Revised: 01/26/2021 Document Reviewed: 01/26/2021 Elsevier Patient Education  2024 Elsevier Inc.    If you have been instructed to have an in-person evaluation today at a local Urgent Care facility, please use the link below. It will take you to a list of all of our available [facility], including address, phone number and hours of operation. Please do not delay care.  [facility]  If you or a family member do not have a primary care provider, use the link below to schedule a visit and establish care. When you choose a [facility] primary care physician or advanced practice provider, you gain a long-term partner in health. Find a Primary Care Provider  Learn more about [facility] in-office and virtual care options: [facility] - Get Care Now

## 2023-09-27 NOTE — Telephone Encounter (Signed)
Patient VV with UC.  Notified about free dental clinic at [facility] Sunday and Monday.

## 2023-09-27 NOTE — Telephone Encounter (Signed)
  Chief Complaint: tooth pain right upper/ front/side tooth  Symptoms: severe pain , sensitivity to cold/ hot can not chew on that side minimal facial swelling. Reports filling broke . Using advil and oragel lasting less than 15 minutes  Frequency: last night  Pertinent Negatives: Patient denies fever  Disposition: [] ED /[] Urgent Care (no appt availability in office) / [] Appointment(In office/virtual)/ [x]  [facility]/ [] Home Care/ [] Refused Recommended Disposition /[]  Mobile Bus/ []  Follow-up with PCP Additional Notes:   Recommended UC . Offered UC VV and patient reports she will attempt to set up appt. Please advise for dentist recommendations/ or OV.   Requesting a call back.    Reason for Disposition  [1] SEVERE pain (e.g., excruciating, unable to eat, unable to do any normal activities) AND [2] not improved 2 hours after pain medicine  Answer Assessment - Initial Assessment Questions 1. LOCATION: "Which tooth is hurting?"  (e.g., right-side/left-side, upper/lower, front/back)     Right front upper /side tooth  2. ONSET: "When did the toothache start?"  (e.g., hours, days)      Last night  3. SEVERITY: "How bad is the toothache?"  (Scale 1-10; mild, moderate or severe)   - MILD (1-3): doesn't interfere with chewing    - MODERATE (4-7): interferes with chewing, interferes with normal activities, awakens from sleep     - SEVERE (8-10): unable to eat, unable to do any normal activities, excruciating pain        Severe level 9  4. SWELLING: "Is there any visible swelling of your face?"     Small amount of right face  5. OTHER SYMPTOMS: "Do you have any other symptoms?" (e.g., fever)     Pain sensitivity to cold/ hot and touch. Headaches  6. PREGNANCY: "Is there any chance you are pregnant?" "When was your last menstrual period?"     na  Protocols used: [facility]

## 2023-09-30 ENCOUNTER — Ambulatory Visit: Payer: Medicaid Other | Attending: Nurse Practitioner | Admitting: Nurse Practitioner

## 2023-09-30 VITALS — BP 136/86 | HR 78 | Ht 65.0 in | Wt 203.0 lb

## 2023-09-30 DIAGNOSIS — D500 Iron deficiency anemia secondary to blood loss (chronic): Secondary | ICD-10-CM

## 2023-09-30 DIAGNOSIS — Z0000 Encounter for general adult medical examination without abnormal findings: Secondary | ICD-10-CM

## 2023-09-30 DIAGNOSIS — R7309 Other abnormal glucose: Secondary | ICD-10-CM

## 2023-09-30 DIAGNOSIS — E782 Mixed hyperlipidemia: Secondary | ICD-10-CM | POA: Diagnosis not present

## 2023-09-30 DIAGNOSIS — Z1211 Encounter for screening for malignant neoplasm of colon: Secondary | ICD-10-CM | POA: Diagnosis not present

## 2023-09-30 DIAGNOSIS — E559 Vitamin D deficiency, unspecified: Secondary | ICD-10-CM

## 2023-09-30 NOTE — Progress Notes (Signed)
Assessment & Plan:  Sabrina Larson was seen today for annual exam.  Diagnoses and all orders for this visit:  Encounter for annual physical exam  Iron deficiency anemia due to chronic blood loss -     CBC with Differential  Vitamin D deficiency disease -     VITAMIN D 25 Hydroxy (Vit-D Deficiency, Fractures)  Mixed hyperlipidemia -     Lipid panel  Elevated glucose -     CMP14+EGFR -     Hemoglobin A1c  Colon cancer screening -     Ambulatory referral to Gastroenterology    Patient has been counseled on age-appropriate routine health concerns for screening and prevention. These are reviewed and up-to-date. Referrals have been placed accordingly. Immunizations are up-to-date or declined.    Subjective:   Chief Complaint  Patient presents with   Annual Exam    Sabrina Larson 47 y.o. female presents to office today for annual physical exam.  I have not seen her in this office since February 2023.   Patient has been counseled on age-appropriate routine health concerns for screening and prevention. These are reviewed and up-to-date. Referrals have been placed accordingly. Immunizations are up-to-date or declined.     Mammogram: Up-to-date Pap smear: Up today Colon cancer screening: Overdue.  Referral placed    She has past medical history of hypertension, prediabetes and anemia.  She was taken off hydrochlorothiazide in the past due to normal blood pressure readings.  Currently she is not taking any iron supplements for her anemia BP Readings from Last 3 Encounters:  09/30/23 136/86  12/13/22 119/77  12/08/22 131/82       Review of Systems  Constitutional:  Negative for fever, malaise/fatigue and weight loss.  HENT: Negative.  Negative for nosebleeds.   Eyes: Negative.  Negative for blurred vision, double vision and photophobia.  Respiratory: Negative.  Negative for cough and shortness of breath.   Cardiovascular: Negative.  Negative for chest pain,  palpitations and leg swelling.  Gastrointestinal: Negative.  Negative for heartburn, nausea and vomiting.  Genitourinary: Negative.   Musculoskeletal: Negative.  Negative for myalgias.  Skin: Negative.   Neurological: Negative.  Negative for dizziness, focal weakness, seizures and headaches.  Endo/Heme/Allergies: Negative.   Psychiatric/Behavioral: Negative.  Negative for suicidal ideas.     Past Medical History:  Diagnosis Date   Anemia    Blood transfusion without reported diagnosis    COVID 10/2022   resolved   GERD (gastroesophageal reflux disease)    Hypertension    Pre-diabetes     Past Surgical History:  Procedure Laterality Date   CESAREAN SECTION     2014, 1995   DILATION AND CURETTAGE OF UTERUS     8 total in office and outpatient   DILATION AND EVACUATION N/A 12/13/2022   Procedure: DILATATION AND EVACUATION;  Surgeon: Warden Fillers, MD;  Location: [facility] OR;  Service: Gynecology;  Laterality: N/A;   OPERATIVE ULTRASOUND N/A 12/13/2022   Procedure: OPERATIVE ULTRASOUND;  Surgeon: Warden Fillers, MD;  Location: [facility] OR;  Service: Gynecology;  Laterality: N/A;   SKIN GRAFT     at age 54    Family History  Problem Relation Age of Onset   Asthma Mother    Hypertension Mother    Depression Mother    Diabetes Mother    Drug abuse Mother    Asthma Brother     Social History Reviewed with no changes to be made today.   Outpatient Medications Prior to Visit  Medication Sig Dispense Refill   amoxicillin (AMOXIL) 500 MG capsule Take 1 capsule (500 mg total) by mouth 3 (three) times daily for 10 days. 30 capsule 0   ibuprofen (ADVIL) 800 MG tablet Take 1 tablet (800 mg total) by mouth every 8 (eight) hours as needed. 30 tablet 0   ferrous sulfate (FERROUSUL) 325 (65 FE) MG tablet Take 1 tablet (325 mg total) by mouth every other day. (Patient not taking: Reported on 09/30/2023) 30 tablet 3   fluconazole (DIFLUCAN) 150 MG tablet Take 1 tablet (150 mg total) by mouth  every 3 (three) days as needed. (Patient not taking: Reported on 09/30/2023) 3 tablet 0   No facility-administered medications prior to visit.    Allergies  Allergen Reactions   Lactose Other (See Comments)    Gi-upset       Objective:    BP 136/86   Pulse 78   Ht 5\' 5"  (1.651 m)   Wt 203 lb (92.1 kg)   LMP 09/26/2023 Comment: ended  SpO2 99%   BMI 33.78 kg/m  Wt Readings from Last 3 Encounters:  09/30/23 203 lb (92.1 kg)  12/13/22 188 lb (85.3 kg)  12/08/22 193 lb 3.2 oz (87.6 kg)    Physical Exam Constitutional:      Appearance: She is well-developed.  HENT:     Head: Normocephalic and atraumatic.     Right Ear: Hearing, tympanic membrane, ear canal and external ear normal.     Left Ear: Hearing, tympanic membrane, ear canal and external ear normal.     Nose: Nose normal.     Right Turbinates: Not enlarged.     Left Turbinates: Not enlarged.     Mouth/Throat:     Lips: Pink.     Mouth: Mucous membranes are moist.     Dentition: No dental tenderness, gingival swelling, dental abscesses or gum lesions.     Pharynx: No oropharyngeal exudate.  Eyes:     General: No scleral icterus.       Right eye: No discharge.     Extraocular Movements: Extraocular movements intact.     Conjunctiva/sclera: Conjunctivae normal.     Pupils: Pupils are equal, round, and reactive to light.  Neck:     Thyroid: No thyromegaly.     Trachea: No tracheal deviation.  Cardiovascular:     Rate and Rhythm: Normal rate and regular rhythm.     Heart sounds: Normal heart sounds. No murmur heard.    No friction rub.  Pulmonary:     Effort: Pulmonary effort is normal. No accessory muscle usage or respiratory distress.     Breath sounds: Normal breath sounds. No decreased breath sounds, wheezing, rhonchi or rales.  Abdominal:     General: Bowel sounds are normal. There is no distension.     Palpations: Abdomen is soft. There is no mass.     Tenderness: There is no abdominal tenderness.  There is no right CVA tenderness, left CVA tenderness, guarding or rebound.     Hernia: No hernia is present.  Musculoskeletal:        General: No tenderness or deformity. Normal range of motion.     Cervical back: Normal range of motion and neck supple.  Lymphadenopathy:     Cervical: No cervical adenopathy.  Skin:    General: Skin is warm and dry.     Findings: No erythema.  Neurological:     Mental Status: She is alert and oriented to person, place, and time.  Cranial Nerves: No cranial nerve deficit.     Motor: Motor function is intact.     Coordination: Coordination is intact. Coordination normal.     Gait: Gait is intact.     Deep Tendon Reflexes:     Reflex Scores:      Patellar reflexes are 1+ on the right side and 1+ on the left side. Psychiatric:        Attention and Perception: Attention normal.        Mood and Affect: Mood normal.        Speech: Speech normal.        Behavior: Behavior normal.        Thought Content: Thought content normal.        Judgment: Judgment normal.          Patient has been counseled extensively about nutrition and exercise as well as the importance of adherence with medications and regular follow-up. The patient was given clear instructions to go to ER or return to medical center if symptoms don't improve, worsen or new problems develop. The patient verbalized understanding.   Follow-up: No follow-ups on file.   Claiborne Rigg, FNP-BC [facility] [place], [place] [phone]   09/30/2023, 2:50 PM

## 2023-09-30 NOTE — Patient Instructions (Signed)
[facility] Located in: [facility] [address] Address: [address], [place], [place] [address] Hours:  Open ? Closes 5?PM Phone: [phone]

## 2023-10-01 ENCOUNTER — Other Ambulatory Visit: Payer: Self-pay | Admitting: Nurse Practitioner

## 2023-10-01 DIAGNOSIS — D500 Iron deficiency anemia secondary to blood loss (chronic): Secondary | ICD-10-CM

## 2023-10-01 DIAGNOSIS — E559 Vitamin D deficiency, unspecified: Secondary | ICD-10-CM

## 2023-10-01 LAB — LIPID PANEL
Chol/HDL Ratio: 3.3 ratio (ref 0.0–4.4)
Cholesterol, Total: 163 mg/dL (ref 100–199)
HDL: 49 mg/dL (ref >39)
LDL Chol Calc (NIH): 100 mg/dL — ABNORMAL HIGH (ref 0–99)
Triglycerides: 71 mg/dL (ref 0–149)
VLDL Cholesterol Cal: 14 mg/dL (ref 5–40)

## 2023-10-01 LAB — CMP14+EGFR
ALT: 11 [IU]/L (ref 0–32)
AST: 17 [IU]/L (ref 0–40)
Albumin: 3.9 g/dL (ref 3.9–4.9)
Alkaline Phosphatase: 97 [IU]/L (ref 44–121)
BUN/Creatinine Ratio: 18 (ref 9–23)
BUN: 11 mg/dL (ref 6–24)
Bilirubin Total: 0.2 mg/dL (ref 0.0–1.2)
CO2: 21 mmol/L (ref 20–29)
Calcium: 9.3 mg/dL (ref 8.7–10.2)
Chloride: 107 mmol/L — ABNORMAL HIGH (ref 96–106)
Creatinine, Ser: 0.62 mg/dL (ref 0.57–1.00)
Globulin, Total: 2.8 g/dL (ref 1.5–4.5)
Glucose: 98 mg/dL (ref 70–99)
Potassium: 3.6 mmol/L (ref 3.5–5.2)
Sodium: 142 mmol/L (ref 134–144)
Total Protein: 6.7 g/dL (ref 6.0–8.5)
eGFR: 110 mL/min/{1.73_m2} (ref >59)

## 2023-10-01 LAB — CBC WITH DIFFERENTIAL/PLATELET
Basophils Absolute: 0 10*3/uL (ref 0.0–0.2)
Basos: 1 %
EOS (ABSOLUTE): 0.3 10*3/uL (ref 0.0–0.4)
Eos: 5 %
Hematocrit: 34.7 % (ref 34.0–46.6)
Hemoglobin: 10.7 g/dL — ABNORMAL LOW (ref 11.1–15.9)
Immature Grans (Abs): 0 10*3/uL (ref 0.0–0.1)
Immature Granulocytes: 0 %
Lymphocytes Absolute: 2.4 10*3/uL (ref 0.7–3.1)
Lymphs: 35 %
MCH: 22 pg — ABNORMAL LOW (ref 26.6–33.0)
MCHC: 30.8 g/dL — ABNORMAL LOW (ref 31.5–35.7)
MCV: 71 fL — ABNORMAL LOW (ref 79–97)
Monocytes Absolute: 0.5 10*3/uL (ref 0.1–0.9)
Monocytes: 7 %
Neutrophils Absolute: 3.6 10*3/uL (ref 1.4–7.0)
Neutrophils: 52 %
Platelets: 467 10*3/uL — ABNORMAL HIGH (ref 150–450)
RBC: 4.86 x10E6/uL (ref 3.77–5.28)
RDW: 17.6 % — ABNORMAL HIGH (ref 11.7–15.4)
WBC: 6.8 10*3/uL (ref 3.4–10.8)

## 2023-10-01 LAB — VITAMIN D 25 HYDROXY (VIT D DEFICIENCY, FRACTURES): Vit D, 25-Hydroxy: 18 ng/mL — ABNORMAL LOW (ref 30.0–100.0)

## 2023-10-01 LAB — HEMOGLOBIN A1C
Est. average glucose Bld gHb Est-mCnc: 131 mg/dL
Hgb A1c MFr Bld: 6.2 % — ABNORMAL HIGH (ref 4.8–5.6)

## 2023-10-01 MED ORDER — FERROUS SULFATE 325 (65 FE) MG PO TABS: 325.0000 mg | ORAL_TABLET | ORAL | 3 refills | Status: AC

## 2023-10-01 MED ORDER — VITAMIN D (ERGOCALCIFEROL) 1.25 MG (50000 UNIT) PO CAPS: 50000.0000 [IU] | ORAL_CAPSULE | ORAL | 0 refills | Status: DC

## 2023-10-03 ENCOUNTER — Telehealth: Payer: Self-pay

## 2023-10-03 NOTE — Telephone Encounter (Signed)
Patient called for lab results.  Shared provider's notes.  Pt is wondering when she is to RTC for follow up.   Also at OV pt and provider discussed prescribing medication for HTN after labs returned.  No note regarding this.  Please advise pt.  Bascom Levels, NP 10/01/2023  9:01 AM EDT     Vitamin D is low. Will send script for weekly vitamin D that you will take for 3 months. After that you can purchase Vitamin D over the counter 2000-5000 units daily.     Labs still show anemia. I have referred you to GI for colonoscopy and I also recommend following up with Gynecology for this. Iron has been refilled as well.    Kidney, liver function and electrolytes are normal.  Written by Claiborne Rigg, NP on 10/01/2023  8:58 AM EDT   A1c continues in prediabetes range  Written by Claiborne Rigg, NP on 10/01/2023  8:58 AM ED

## 2023-10-06 NOTE — Telephone Encounter (Signed)
3 month follow-up

## 2023-10-08 ENCOUNTER — Encounter: Payer: Self-pay | Admitting: Gastroenterology

## 2023-10-10 ENCOUNTER — Other Ambulatory Visit: Payer: Self-pay | Admitting: Physician Assistant

## 2023-10-10 DIAGNOSIS — K047 Periapical abscess without sinus: Secondary | ICD-10-CM

## 2023-10-11 ENCOUNTER — Ambulatory Visit: Payer: Self-pay

## 2023-10-11 ENCOUNTER — Other Ambulatory Visit: Payer: Self-pay

## 2023-10-11 ENCOUNTER — Emergency Department (HOSPITAL_COMMUNITY)
Admission: EM | Admit: 2023-10-11 | Discharge: 2023-10-11 | Disposition: A | Discharge status: Home / Self Care | Payer: Medicaid Other | Attending: Emergency Medicine | Admitting: Emergency Medicine

## 2023-10-11 DIAGNOSIS — K047 Periapical abscess without sinus: Secondary | ICD-10-CM | POA: Diagnosis not present

## 2023-10-11 DIAGNOSIS — K0889 Other specified disorders of teeth and supporting structures: Secondary | ICD-10-CM | POA: Insufficient documentation

## 2023-10-11 MED ORDER — KETOROLAC TROMETHAMINE 15 MG/ML IJ SOLN
15.0000 mg | Freq: Once | INTRAMUSCULAR | Status: AC
  Administered 2023-10-11: 15 mg via INTRAMUSCULAR
  Filled 2023-10-11: qty 1

## 2023-10-11 MED ORDER — AMOXICILLIN-POT CLAVULANATE 875-125 MG PO TABS: 1.0000 | ORAL_TABLET | Freq: Two times a day (BID) | ORAL | 0 refills | Status: AC

## 2023-10-11 MED ORDER — BENZOCAINE 10 % MT GEL
Freq: Once | OROMUCOSAL | Status: AC
  Filled 2023-10-11: qty 9

## 2023-10-11 MED ORDER — BENZOCAINE 10 % MT GEL: 1.0000 | OROMUCOSAL | 0 refills | Status: DC | PRN

## 2023-10-11 NOTE — Telephone Encounter (Signed)
Chief Complaint: Mouth Pain  Symptoms: Pain 10/10 after teeth pulled yesterday, gum swelling Frequency: constant Pertinent Negatives: Patient denies chest pain, difficulty breathing, fever Disposition: [] ED /[x] Urgent Care (no appt availability in office) / [] Appointment(In office/virtual)/ []  [facility]/ [] Home Care/ [] Refused Recommended Disposition /[] [facility]/ []  Follow-up with PCP Additional Notes: Patient states she had 2 teeth pulled yesterday at the dentist in the upper front porting of her mouth. Patient states she was advised to take Tylenol and Ibuprofen alternating. Patient reports the pain is unbearable and could not sleep. Patient states she took tylenol and 800 MG Ibuprofen and tried ice on the mouth. Patient also called Dental office but they are closed on Fridays and did not have an after hours answering service. Patient was given care advice and encouraged to seek treatment at urgent care. No appointments available at urgent care today but patient stated she would go as a walk-in. Advised patient I would forward to PCP for additional recommendations.  Reason for Disposition  [1] SEVERE mouth pain (e.g., excruciating) AND [2] not improved after 2 hours of pain medicine  Answer Assessment - Initial Assessment Questions 1. ONSET: "When did the mouth start hurting?" (e.g., hours or days ago)      Yesterday  2. SEVERITY: "How bad is the pain?" (Scale 1-10; mild, moderate or severe)   - MILD (1-3):  doesn't interfere with eating or normal activities   - MODERATE (4-7): interferes with eating some solids and normal activities   - SEVERE (8-10):  excruciating pain, interferes with most normal activities   - SEVERE DYSPHAGIA: can't swallow liquids, drooling     10/10 3. SORES: "Are there any sores or ulcers in the mouth?" If Yes, ask: "What part of the mouth are the sores in?"     No  4. FEVER: "Do you have a fever?" If Yes, ask: "What is your temperature, how  was it measured, and when did it start?"     No  5. CAUSE: "What do you think is causing the mouth pain?"     I had 2 teeth pull yesterday  6. OTHER SYMPTOMS: "Do you have any other symptoms?" (e.g., difficulty breathing)     Swollen  Protocols used: Mouth Pain-A-AH

## 2023-10-11 NOTE — Telephone Encounter (Signed)
Spoke with patient. Advised that se should continue taking 800 mg of IBU and apply ice pack if needed put only for in 15 min intervals. Advise that if pain worsen she should got to UC. Advised that she also should contact the dental office as so they open on Monday. Advised that if the office has an answering machine to please leave them a message as they may have staff checking after hour messages. Patient voiced understanding of all discussed .

## 2023-10-11 NOTE — ED Provider Notes (Signed)
[place] EMERGENCY DEPARTMENT AT [facility] Provider Note   CSN: [id number] Arrival date & time: 10/11/23  1456     History {Add pertinent medical, surgical, social history, OB history to HPI:1} Chief Complaint  Patient presents with   Dental Pain    Sabrina Larson is a 47 y.o. female who presents for dental pain after extraction.  Patient had 2 right upper molars extracted yesterday by her dentist.  She has had increasing pain into today as well as chills but no fevers.  Had difficulty eating and drinking due to pain.  Has been taking Tylenol and Motrin but not improving.  No pain or swelling to the below the mouth.  No neck pain or stiffness.  No sore throat.   The history is provided by the patient.       Home Medications Prior to Admission medications   Medication Sig Start Date End Date Taking? Authorizing Provider  ferrous sulfate (FERROUSUL) 325 (65 FE) MG tablet Take 1 tablet (325 mg total) by mouth every other day. 10/01/23   Claiborne Rigg, NP  fluconazole (DIFLUCAN) 150 MG tablet Take 1 tablet (150 mg total) by mouth every 3 (three) days as needed. Patient not taking: Reported on 09/30/2023 09/27/23   Margaretann Loveless, PA-C  ibuprofen (ADVIL) 800 MG tablet Take 1 tablet (800 mg total) by mouth every 8 (eight) hours as needed. 09/27/23   Margaretann Loveless, PA-C  Vitamin D, Ergocalciferol, (DRISDOL) 1.25 MG (50000 UNIT) CAPS capsule Take 1 capsule (50,000 Units total) by mouth every 7 (seven) days. 10/01/23   Claiborne Rigg, NP      Allergies    Lactose    Review of Systems   Review of Systems per HPI  Physical Exam Updated Vital Signs BP (!) 174/105   Pulse 77   Temp 99.5 F (37.5 C) (Oral)   Resp 16   Ht 5\' 6"  (1.676 m)   Wt 91.6 kg   LMP 09/26/2023 Comment: ended  SpO2 99%   BMI 32.60 kg/m  Physical Exam Constitutional:      General: She is not in acute distress.    Appearance: Normal appearance. She is not  ill-appearing.  HENT:     Head: Normocephalic and atraumatic.     Nose: Nose normal.     Mouth/Throat:     Mouth: Mucous membranes are moist.     Comments: To right upper molars status post extraction.  No obvious signs of dental abscess visibly or on palpation of the buccal or palate side of the gums of these teeth.  No pain or swelling to the floor of the mouth or under the chin.  Normal-appearing pharynx without signs of tonsillitis or PTA.  Postextraction sites appear mildly erythematous.  Eyes:     Extraocular Movements: Extraocular movements intact.     Pupils: Pupils are equal, round, and reactive to light.  Neck:     Comments: Able to range the neck without deficit Cardiovascular:     Rate and Rhythm: Normal rate and regular rhythm.     Pulses: Normal pulses.  Pulmonary:     Effort: Pulmonary effort is normal.  Abdominal:     General: There is no distension.     Palpations: Abdomen is soft.     Tenderness: There is no abdominal tenderness. There is no guarding.  Musculoskeletal:     Cervical back: Neck supple. No rigidity.  Skin:    General: Skin is warm and  dry.     Capillary Refill: Capillary refill takes less than 2 seconds.  Neurological:     General: No focal deficit present.     Mental Status: She is alert.     Sensory: No sensory deficit.     Motor: No weakness.     ED Results / Procedures / Treatments   Labs (all labs ordered are listed, but only abnormal results are displayed) Labs Reviewed - No data to display  EKG None  Radiology No results found.  Procedures Procedures  {Document cardiac monitor, telemetry assessment procedure when appropriate:1}  Medications Ordered in ED Medications - No data to display  ED Course/ Medical Decision Making/ A&P   {   Click here for ABCD2, HEART and other calculatorsREFRESH Note before signing :1}                              Medical Decision Making Risk OTC drugs. Prescription drug  management.   47 year old female who presents with dental pain 1 day after extraction of 2 upper right sided molars. DDx considered includes dental abscess, facial cellulitis or abscess, Ludwig angina, PTA, RPA, airway compromise, postprocedural pain.  Her physical exam is without evidence of Ludwig angina, PTA, RPA (normal neck mobility), airway compromise, facial cellulitis or abscess, or overt dental abscess.  Her post tooth extraction sites are not draining and possibly appear consistent with what would be expected normally day 1 post tooth extraction, though there is mild erythema and with pain being severe enough to bring her to the emergency department I am concerned for occult infection.  She was given Orajel in the ED for local pain control and a shot of Toradol.  I sent prescription for Augmentin to her preferred pharmacy as well as a prescription for oragel.  Discussed how to use these prescriptions.  Recommended she follow-up with her dentist and call him on Monday.  Return precautions were discussed.  She was discharged from the ED in stable condition.    {Document critical care time when appropriate:1} {Document review of labs and clinical decision tools ie heart score, Chads2Vasc2 etc:1}  {Document your independent review of radiology images, and any outside records:1} {Document your discussion with family members, caretakers, and with consultants:1} {Document social determinants of health affecting pt's care:1} {Document your decision making why or why not admission, treatments were needed:1} Final Clinical Impression(s) / ED Diagnoses Final diagnoses:  None    Rx / DC Orders ED Discharge Orders     None

## 2023-10-11 NOTE — ED Triage Notes (Signed)
Patient complains of R upper dental pain s/p tooth extraction yesterday. Taking 800 mg Motrin without relief.

## 2023-10-11 NOTE — Discharge Instructions (Addendum)
You were seen today for dental pain.  Your physical exam did not show evidence of an abscess or overt acute infection.  Given the amount of pain that you are in though I am concerned there may be a very early infection starting.  I am sending an antibiotic called Augmentin to the [facility] on [address] for you.  This is a 24-hour pharmacy so the prescription should be ready for you when you leave.  I am also sending a prescription for an anesthetic gel that you can apply locally to the area of your pain.  Please continue to take Tylenol and ibuprofen to control your pain in addition to using this anesthetic gel.  Please call your dentist on Monday to get a follow-up appointment.  If you develop fevers or chills, facial redness or swelling, swelling or worsening pain inside your mouth, return to the emergency department.

## 2023-10-19 MED ORDER — IBUPROFEN 800 MG PO TABS: 800.0000 mg | ORAL_TABLET | Freq: Three times a day (TID) | ORAL | 0 refills | Status: DC | PRN

## 2023-11-05 ENCOUNTER — Ambulatory Visit (AMBULATORY_SURGERY_CENTER): Payer: Medicaid Other

## 2023-11-05 ENCOUNTER — Telehealth: Payer: Self-pay

## 2023-11-05 VITALS — Ht 66.5 in | Wt 195.0 lb

## 2023-11-05 DIAGNOSIS — Z1211 Encounter for screening for malignant neoplasm of colon: Secondary | ICD-10-CM

## 2023-11-05 MED ORDER — PEG 3350-KCL-NA BICARB-NACL 420 G PO SOLR: 4000.0000 mL | Freq: Once | ORAL | 0 refills | Status: AC

## 2023-11-05 NOTE — Telephone Encounter (Signed)
Called patient for PV.  No answer will try back in 5 min.

## 2023-11-05 NOTE — Telephone Encounter (Signed)
Completed pre visit

## 2023-11-05 NOTE — Progress Notes (Signed)

## 2023-11-19 ENCOUNTER — Encounter: Payer: Medicaid Other | Admitting: Gastroenterology

## 2023-11-29 ENCOUNTER — Telehealth: Payer: Self-pay | Admitting: Gastroenterology

## 2023-11-29 ENCOUNTER — Other Ambulatory Visit: Payer: Self-pay | Admitting: Nurse Practitioner

## 2023-11-29 DIAGNOSIS — E559 Vitamin D deficiency, unspecified: Secondary | ICD-10-CM

## 2023-11-29 NOTE — Telephone Encounter (Signed)
Pt states she changed her procedure date to 12/30 and said the new copy of instructiolns were supposed to be sent to My Chart but were not. RN to make new set and send via My chart and call back to verify if got them.  New Prep instructions sent with new dates to pt's Myu chart. Pt states she knows where she is to find them in My Chart and states she remembers what she was not to do the 7 days before and 5 days before the procedure. Instructed pt to call if has any questions. No questions at this time per pt

## 2023-11-29 NOTE — Telephone Encounter (Signed)
Patient needing updated prep instructions as well as speak with a nurse in regards to upcoming procedure.   Please advise.

## 2023-12-02 ENCOUNTER — Ambulatory Visit: Payer: Medicaid Other | Admitting: Gastroenterology

## 2023-12-02 ENCOUNTER — Encounter: Payer: Self-pay | Admitting: Gastroenterology

## 2023-12-02 VITALS — BP 128/85 | HR 79 | Temp 97.8°F | Resp 17 | Ht 66.5 in | Wt 195.0 lb

## 2023-12-02 DIAGNOSIS — Z1211 Encounter for screening for malignant neoplasm of colon: Secondary | ICD-10-CM

## 2023-12-02 DIAGNOSIS — K635 Polyp of colon: Secondary | ICD-10-CM | POA: Diagnosis not present

## 2023-12-02 DIAGNOSIS — K648 Other hemorrhoids: Secondary | ICD-10-CM

## 2023-12-02 DIAGNOSIS — D122 Benign neoplasm of ascending colon: Secondary | ICD-10-CM | POA: Diagnosis not present

## 2023-12-02 DIAGNOSIS — Z800 Family history of malignant neoplasm of digestive organs: Secondary | ICD-10-CM | POA: Diagnosis not present

## 2023-12-02 DIAGNOSIS — K644 Residual hemorrhoidal skin tags: Secondary | ICD-10-CM

## 2023-12-02 DIAGNOSIS — I10 Essential (primary) hypertension: Secondary | ICD-10-CM | POA: Diagnosis not present

## 2023-12-02 DIAGNOSIS — R7303 Prediabetes: Secondary | ICD-10-CM | POA: Diagnosis not present

## 2023-12-02 DIAGNOSIS — D125 Benign neoplasm of sigmoid colon: Secondary | ICD-10-CM | POA: Diagnosis not present

## 2023-12-02 MED ORDER — SODIUM CHLORIDE 0.9 % IV SOLN: 500.0000 mL | Freq: Once | INTRAVENOUS | Status: DC

## 2023-12-02 NOTE — Progress Notes (Signed)
Sedate, gd SR, tolerated procedure well, VSS, report to RN 

## 2023-12-02 NOTE — Patient Instructions (Signed)
Discharge instructions given. Handouts on polyps and Hemorrhoids. Resume previous medications. YOU HAD AN ENDOSCOPIC PROCEDURE TODAY AT THE [facility]:   Refer to the procedure report that was given to you for any specific questions about what was found during the examination.  If the procedure report does not answer your questions, please call your gastroenterologist to clarify.  If you requested that your care partner not be given the details of your procedure findings, then the procedure report has been included in a sealed envelope for you to review at your convenience later.  YOU SHOULD EXPECT: Some feelings of bloating in the abdomen. Passage of more gas than usual.  Walking can help get rid of the air that was put into your GI tract during the procedure and reduce the bloating. If you had a lower endoscopy (such as a colonoscopy or flexible sigmoidoscopy) you may notice spotting of blood in your stool or on the toilet paper. If you underwent a bowel prep for your procedure, you may not have a normal bowel movement for a few days.  Please Note:  You might notice some irritation and congestion in your nose or some drainage.  This is from the oxygen used during your procedure.  There is no need for concern and it should clear up in a day or so.  SYMPTOMS TO REPORT IMMEDIATELY:  Following lower endoscopy (colonoscopy or flexible sigmoidoscopy):  Excessive amounts of blood in the stool  Significant tenderness or worsening of abdominal pains  Swelling of the abdomen that is new, acute  Fever of 100F or higher   For urgent or emergent issues, a gastroenterologist can be reached at any hour by calling [phone]. Do not use MyChart messaging for urgent concerns.    DIET:  We do recommend a small meal at first, but then you may proceed to your regular diet.  Drink plenty of fluids but you should avoid alcoholic beverages for 24 hours.  ACTIVITY:  You should plan to take it  easy for the rest of today and you should NOT DRIVE or use heavy machinery until tomorrow (because of the sedation medicines used during the test).    FOLLOW UP: Our staff will call the number listed on your records the next business day following your procedure.  We will call around 7:15- 8:00 am to check on you and address any questions or concerns that you may have regarding the information given to you following your procedure. If we do not reach you, we will leave a message.     If any biopsies were taken you will be contacted by phone or by letter within the next 1-3 weeks.  Please call us at [phone] if you have not heard about the biopsies in 3 weeks.    SIGNATURES/CONFIDENTIALITY: You and/or your care partner have signed paperwork which will be entered into your electronic medical record.  These signatures attest to the fact that that the information above on your After Visit Summary has been reviewed and is understood.  Full responsibility of the confidentiality of this discharge information lies with you and/or your care-partner. 

## 2023-12-02 NOTE — Op Note (Signed)
[facility] Patient Name: Sabrina Larson Procedure Date: 12/02/2023 2:47 PM MRN: [id number] Endoscopist: Napoleon Form , MD, [id number] Age: 47 Referring MD:  Date of Birth: 1976/06/05 Gender: Female Account #: [id number] Procedure:                Colonoscopy Indications:              Screening for colorectal malignant neoplasm Medicines:                Monitored Anesthesia Care Procedure:                Pre-Anesthesia Assessment:                           - Prior to the procedure, a History and Physical                            was performed, and patient medications and                            allergies were reviewed. The patient's tolerance of                            previous anesthesia was also reviewed. The risks                            and benefits of the procedure and the sedation                            options and risks were discussed with the patient.                            All questions were answered, and informed consent                            was obtained. Prior Anticoagulants: The patient has                            taken no anticoagulant or antiplatelet agents. ASA                            Grade Assessment: II - A patient with mild systemic                            disease. After reviewing the risks and benefits,                            the patient was deemed in satisfactory condition to                            undergo the procedure.                           After obtaining informed consent, the colonoscope  was passed under direct vision. Throughout the                            procedure, the patient's blood pressure, pulse, and                            oxygen saturations were monitored continuously. The                            Olympus Scope [id number] was introduced through the                            anus and advanced to the the cecum, identified by                             appendiceal orifice and ileocecal valve. The                            colonoscopy was performed without difficulty. The                            patient tolerated the procedure well. The quality                            of the bowel preparation was good. The ileocecal                            valve, appendiceal orifice, and rectum were                            photographed. Scope In: 2:58:02 PM Scope Out: 3:16:44 PM Scope Withdrawal Time: 0 hours 12 minutes 42 seconds  Total Procedure Duration: 0 hours 18 minutes 42 seconds  Findings:                 The perianal and digital rectal examinations were                            normal.                           Two sessile polyps were found in the sigmoid colon                            and ascending colon. The polyps were 5 to 7 mm in                            size. These polyps were removed with a cold snare.                            Resection and retrieval were complete.                           Non-bleeding external and internal hemorrhoids were  found during retroflexion. The hemorrhoids were                            medium-sized. Complications:            No immediate complications. Estimated Blood Loss:     Estimated blood loss was minimal. Impression:               - Two 5 to 7 mm polyps in the sigmoid colon and in                            the ascending colon, removed with a cold snare.                            Resected and retrieved.                           - Non-bleeding external and internal hemorrhoids. Recommendation:           - Patient has a contact number available for                            emergencies. The signs and symptoms of potential                            delayed complications were discussed with the                            patient. Return to normal activities tomorrow.                            Written discharge instructions were provided to the                             patient.                           - Resume previous diet.                           - Continue present medications.                           - Await pathology results.                           - Repeat colonoscopy in 5-10 years for surveillance                            based on pathology results. Napoleon Form, MD 12/02/2023 3:23:28 PM This report has been signed electronically.

## 2023-12-02 NOTE — Progress Notes (Signed)
[facility] History and Physical   Primary Care Physician:  Claiborne Rigg, NP   Reason for Procedure:  Colorectal cancer screening  Plan:    Screening colonoscopy with possible interventions as needed     HPI: Sabrina Larson is a very pleasant 47 y.o. female here for screening colonoscopy. Denies any nausea, vomiting, abdominal pain, melena or bright red blood per rectum  The risks and benefits as well as alternatives of endoscopic procedure(s) have been discussed and reviewed. All questions answered. The patient agrees to proceed.    Past Medical History:  Diagnosis Date   Anemia    Blood transfusion without reported diagnosis    COVID 10/2022   resolved   GERD (gastroesophageal reflux disease)    Hypertension    Pre-diabetes     Past Surgical History:  Procedure Laterality Date   CESAREAN SECTION     2014, 1995   DILATION AND CURETTAGE OF UTERUS     8 total in office and outpatient   DILATION AND EVACUATION N/A 12/13/2022   Procedure: DILATATION AND EVACUATION;  Surgeon: Warden Fillers, MD;  Location: [facility] OR;  Service: Gynecology;  Laterality: N/A;   OPERATIVE ULTRASOUND N/A 12/13/2022   Procedure: OPERATIVE ULTRASOUND;  Surgeon: Warden Fillers, MD;  Location: [facility] OR;  Service: Gynecology;  Laterality: N/A;   SKIN GRAFT     at age 13    Prior to Admission medications   Medication Sig Start Date End Date Taking? Authorizing Provider  ferrous sulfate (FERROUSUL) 325 (65 FE) MG tablet Take 1 tablet (325 mg total) by mouth every other day. 10/01/23  Yes Claiborne Rigg, NP  benzocaine (ORAJEL) 10 % mucosal gel Use as directed 1 Application in the mouth or throat as needed for mouth pain. Patient not taking: Reported on 12/02/2023 10/11/23   Karmen Stabs, MD  fluconazole (DIFLUCAN) 150 MG tablet Take 1 tablet (150 mg total) by mouth every 3 (three) days as needed. Patient not taking: Reported on 11/05/2023 09/27/23   Margaretann Loveless,  PA-C  ibuprofen (ADVIL) 800 MG tablet Take 1 tablet (800 mg total) by mouth every 8 (eight) hours as needed. Patient not taking: Reported on 12/02/2023 10/19/23   Claiborne Rigg, NP  Vitamin D, Ergocalciferol, (DRISDOL) 1.25 MG (50000 UNIT) CAPS capsule Take 1 capsule (50,000 Units total) by mouth every 7 (seven) days. Patient not taking: Reported on 12/02/2023 10/01/23   Claiborne Rigg, NP    Current Outpatient Medications  Medication Sig Dispense Refill   ferrous sulfate (FERROUSUL) 325 (65 FE) MG tablet Take 1 tablet (325 mg total) by mouth every other day. 90 tablet 3   benzocaine (ORAJEL) 10 % mucosal gel Use as directed 1 Application in the mouth or throat as needed for mouth pain. (Patient not taking: Reported on 12/02/2023) 5.3 g 0   fluconazole (DIFLUCAN) 150 MG tablet Take 1 tablet (150 mg total) by mouth every 3 (three) days as needed. (Patient not taking: Reported on 11/05/2023) 3 tablet 0   ibuprofen (ADVIL) 800 MG tablet Take 1 tablet (800 mg total) by mouth every 8 (eight) hours as needed. (Patient not taking: Reported on 12/02/2023) 30 tablet 0   Vitamin D, Ergocalciferol, (DRISDOL) 1.25 MG (50000 UNIT) CAPS capsule Take 1 capsule (50,000 Units total) by mouth every 7 (seven) days. (Patient not taking: Reported on 12/02/2023) 12 capsule 0   Current Facility-Administered Medications  Medication Dose Route Frequency Provider Last Rate Last Admin   0.9 %  sodium chloride infusion  500 mL Intravenous Once Napoleon Form, MD        Allergies as of 12/02/2023 - Review Complete 12/02/2023  Allergen Reaction Noted   Lactose Other (See Comments) 11/22/2020    Family History  Problem Relation Age of Onset   Colon polyps Mother    Colon cancer Mother 37   Asthma Mother    Hypertension Mother    Depression Mother    Diabetes Mother    Drug abuse Mother    Asthma Brother    Esophageal cancer Neg Hx    Rectal cancer Neg Hx    Stomach cancer Neg Hx     Social History    Socioeconomic History   Marital status: Single    Spouse name: Not on file   Number of children: Not on file   Years of education: Not on file   Highest education level: Bachelor's degree (e.g., BA, AB, BS)  Occupational History   Not on file  Tobacco Use   Smoking status: Former    Current packs/day: 0.00    Types: Cigarettes, E-cigarettes    Quit date: 07/04/2021    Years since quitting: 2.4   Smokeless tobacco: Never  Vaping Use   Vaping status: Some Days   Substances: Flavoring  Substance and Sexual Activity   Alcohol use: Yes    Comment: rarely 1-2 glasses per year   Drug use: Yes    Types: Marijuana    Comment: daily - last used 11-30-23   Sexual activity: Yes    Birth control/protection: Other-see comments, None    Comment: Hx MAB - retainedproducts of conceptions  Other Topics Concern   Not on file  Social History Narrative   Not on file   Social Drivers of Health   Financial Resource Strain: Medium Risk (09/30/2023)   Overall Financial Resource Strain (CARDIA)    Difficulty of Paying Living Expenses: Somewhat hard  Food Insecurity: Food Insecurity Present (09/30/2023)   Hunger Vital Sign    Worried About Running Out of Food in the Last Year: Sometimes true    Ran Out of Food in the Last Year: Sometimes true  Transportation Needs: No Transportation Needs (09/30/2023)   PRAPARE - Administrator, Civil Service (Medical): No    Lack of Transportation (Non-Medical): No  Physical Activity: Unknown (09/30/2023)   Exercise Vital Sign    Days of Exercise per Week: 1 day    Minutes of Exercise per Session: Not on file  Stress: No Stress Concern Present (09/30/2023)   [employer] of Occupational Health - Occupational Stress Questionnaire    Feeling of Stress : Only a little  Social Connections: Moderately Integrated (09/30/2023)   Social Connection and Isolation Panel [NHANES]    Frequency of Communication with Friends and Family: More than three  times a week    Frequency of Social Gatherings with Friends and Family: Once a week    Attends Religious Services: More than 4 times per year    Active Member of [employer] or Organizations: Yes    Attends Banker Meetings: More than 4 times per year    Marital Status: Never married  Intimate Partner Violence: Not At Risk (09/30/2023)   Humiliation, Afraid, Rape, and Kick questionnaire    Fear of Current or Ex-Partner: No    Emotionally Abused: No    Physically Abused: No    Sexually Abused: No    Review of Systems:  All other review  of systems negative except as mentioned in the HPI.  Physical Exam: Vital signs in last 24 hours: BP (!) 154/99   Pulse 97   Temp 97.8 F (36.6 C)   Ht 5' 6.5" (1.689 m)   Wt 195 lb (88.5 kg)   LMP 11/19/2023 (Exact Date)   SpO2 100%   BMI 31.00 kg/m  General:   Alert, NAD Lungs:  Clear .   Heart:  Regular rate and rhythm Abdomen:  Soft, nontender and nondistended. Neuro/Psych:  Alert and cooperative. Normal mood and affect. A and O x 3  Reviewed labs, radiology imaging, old records and pertinent past GI work up  Patient is appropriate for planned procedure(s) and anesthesia in an ambulatory setting   K. Scherry Ran , MD [phone]

## 2023-12-03 ENCOUNTER — Telehealth: Payer: Self-pay

## 2023-12-03 NOTE — Telephone Encounter (Signed)
 Follow up call to pt, lm for pt to call if having any difficulty with normal activities or eating and drinking.  Also to call if any other questions or concerns.

## 2023-12-09 LAB — SURGICAL PATHOLOGY

## 2023-12-31 ENCOUNTER — Encounter: Payer: Self-pay | Admitting: Gastroenterology

## 2024-01-07 ENCOUNTER — Encounter: Payer: Self-pay | Admitting: Nurse Practitioner

## 2024-01-07 ENCOUNTER — Ambulatory Visit: Payer: Medicaid Other | Attending: Nurse Practitioner | Admitting: Nurse Practitioner

## 2024-01-07 VITALS — BP 134/85 | HR 93 | Resp 19 | Ht 66.5 in | Wt 205.8 lb

## 2024-01-07 DIAGNOSIS — E669 Obesity, unspecified: Secondary | ICD-10-CM | POA: Diagnosis not present

## 2024-01-07 DIAGNOSIS — D649 Anemia, unspecified: Secondary | ICD-10-CM | POA: Diagnosis not present

## 2024-01-07 DIAGNOSIS — N393 Stress incontinence (female) (male): Secondary | ICD-10-CM | POA: Diagnosis not present

## 2024-01-07 DIAGNOSIS — E559 Vitamin D deficiency, unspecified: Secondary | ICD-10-CM | POA: Diagnosis not present

## 2024-01-07 MED ORDER — VITAMIN D (ERGOCALCIFEROL) 1.25 MG (50000 UNIT) PO CAPS: 50000.0000 [IU] | ORAL_CAPSULE | ORAL | 0 refills | Status: AC

## 2024-01-07 NOTE — Progress Notes (Signed)
 Assessment & Plan:  Sabrina Larson was seen today for medical management of chronic issues and weight loss.  Diagnoses and all orders for this visit:  Obesity (BMI 30-39.9) -     Amb ref to Medical Nutrition Therapy-MNT Discussed diet and exercise for person with BMI >32. Instructed: You must burn more calories than you eat. Losing 5 percent of your body weight should be considered a success. In the longer term, losing more than 15 percent of your body weight and staying at this weight is an extremely good result. However, keep in mind that even losing 5 percent of your body weight leads to important health benefits, so try not to get discouraged if you're not able to lose more than this. Will recheck weight in 3 months    Vitamin D  deficiency -     VITAMIN D  25 Hydroxy (Vit-D Deficiency, Fractures) -     Vitamin D , Ergocalciferol , (DRISDOL ) 1.25 MG (50000 UNIT) CAPS capsule; Take 1 capsule (50,000 Units total) by mouth every 7 (seven) days.  Stress incontinence of urine -     Urinalysis, Complete  Anemia, unspecified type -     CBC with Differential  Vitamin D  deficiency disease -     Vitamin D , Ergocalciferol , (DRISDOL ) 1.25 MG (50000 UNIT) CAPS capsule; Take 1 capsule (50,000 Units total) by mouth every 7 (seven) days.    Patient has been counseled on age-appropriate routine health concerns for screening and prevention. These are reviewed and up-to-date. Referrals have been placed accordingly. Immunizations are up-to-date or declined.    Subjective:   Chief Complaint  Patient presents with   Medical Management of Chronic Issues   Weight Loss    Sabrina Larson 48 y.o. female presents to office today for follow up to obesity.  She has a past medical history of Anemia, Blood transfusion without reported diagnosis, COVID (10/2022), GERD, Hypertension, and Pre-diabetes.    She has tried intermittent fasting in the past.    Blood pressure is well controlled. She is not  currently taking any antihypertensives.  BP Readings from Last 3 Encounters:  01/07/24 134/85  12/02/23 128/85  10/11/23 (!) 142/104    BMI 32.72. She desires to lose weight. Has not started a routine workout plan or changed her dietary habits. Would like to speak to a nutritionist to help with tools for weight loss. She has done intermittent fasting in the past which was effective.   Urinary Incontinence: Patient complains of urinary incontinence. This has been present for several months. She leaks urine with bending, coughing, lifting, laughing, sneezing. Patient describes the symptoms as  urge to urinate with little or no warning, urine leakage with coughing/heavy physical activity, and urine leaking unpredictably.  Factors associated with symptoms include none known. Evaluation to date includes none.Treatment to date includes none.    Review of Systems  Constitutional:  Negative for fever, malaise/fatigue and weight loss.  HENT: Negative.  Negative for nosebleeds.   Eyes: Negative.  Negative for blurred vision, double vision and photophobia.  Respiratory: Negative.  Negative for cough and shortness of breath.   Cardiovascular: Negative.  Negative for chest pain, palpitations and leg swelling.  Gastrointestinal: Negative.  Negative for heartburn, nausea and vomiting.  Genitourinary:        Incontinence  Musculoskeletal: Negative.  Negative for myalgias.  Neurological: Negative.  Negative for dizziness, focal weakness, seizures and headaches.  Psychiatric/Behavioral:  Negative for suicidal ideas.     Past Medical History:  Diagnosis Date  Anemia    Blood transfusion without reported diagnosis    COVID 10/2022   resolved   GERD (gastroesophageal reflux disease)    Hypertension    Pre-diabetes     Past Surgical History:  Procedure Laterality Date   CESAREAN SECTION     2014, 1995   DILATION AND CURETTAGE OF UTERUS     8 total in office and outpatient   DILATION AND  EVACUATION N/A 12/13/2022   Procedure: DILATATION AND EVACUATION;  Surgeon: Zina Jerilynn LABOR, MD;  Location: [facility] OR;  Service: Gynecology;  Laterality: N/A;   OPERATIVE ULTRASOUND N/A 12/13/2022   Procedure: OPERATIVE ULTRASOUND;  Surgeon: Zina Jerilynn LABOR, MD;  Location: [facility] OR;  Service: Gynecology;  Laterality: N/A;   SKIN GRAFT     at age 56    Family History  Problem Relation Age of Onset   Colon polyps Mother    Colon cancer Mother 62   Asthma Mother    Hypertension Mother    Depression Mother    Diabetes Mother    Drug abuse Mother    Asthma Brother    Esophageal cancer Neg Hx    Rectal cancer Neg Hx    Stomach cancer Neg Hx     Social History Reviewed with no changes to be made today.   Outpatient Medications Prior to Visit  Medication Sig Dispense Refill   ferrous sulfate  (FERROUSUL) 325 (65 FE) MG tablet Take 1 tablet (325 mg total) by mouth every other day. 90 tablet 3   ibuprofen  (ADVIL ) 800 MG tablet Take 1 tablet (800 mg total) by mouth every 8 (eight) hours as needed. (Patient not taking: Reported on 11/05/2023) 30 tablet 0   benzocaine  (ORAJEL) 10 % mucosal gel Use as directed 1 Application in the mouth or throat as needed for mouth pain. (Patient not taking: Reported on 11/05/2023) 5.3 g 0   fluconazole  (DIFLUCAN ) 150 MG tablet Take 1 tablet (150 mg total) by mouth every 3 (three) days as needed. (Patient not taking: Reported on 11/05/2023) 3 tablet 0   Vitamin D , Ergocalciferol , (DRISDOL ) 1.25 MG (50000 UNIT) CAPS capsule Take 1 capsule (50,000 Units total) by mouth every 7 (seven) days. (Patient not taking: Reported on 12/02/2023) 12 capsule 0   No facility-administered medications prior to visit.    Allergies  Allergen Reactions   Lactose Other (See Comments)    Gi-upset       Objective:    BP 134/85 (BP Location: Left Arm, Patient Position: Sitting, Cuff Size: Normal)   Pulse 93   Resp 19   Ht 5' 6.5 (1.689 m)   Wt 205 lb 12.8 oz (93.4 kg)   LMP  12/20/2023 (Exact Date)   SpO2 98%   BMI 32.72 kg/m  Wt Readings from Last 3 Encounters:  01/07/24 205 lb 12.8 oz (93.4 kg)  12/02/23 195 lb (88.5 kg)  11/05/23 195 lb (88.5 kg)    Physical Exam Vitals and nursing note reviewed.  Constitutional:      Appearance: She is well-developed.  HENT:     Head: Normocephalic and atraumatic.  Cardiovascular:     Rate and Rhythm: Normal rate and regular rhythm.     Heart sounds: Normal heart sounds. No murmur heard.    No friction rub. No gallop.  Pulmonary:     Effort: Pulmonary effort is normal. No tachypnea or respiratory distress.     Breath sounds: Normal breath sounds. No decreased breath sounds, wheezing, rhonchi or rales.  Chest:  Chest wall: No tenderness.  Abdominal:     General: Bowel sounds are normal.     Palpations: Abdomen is soft.  Musculoskeletal:        General: Normal range of motion.     Cervical back: Normal range of motion.  Skin:    General: Skin is warm and dry.  Neurological:     Mental Status: She is alert and oriented to person, place, and time.     Coordination: Coordination normal.  Psychiatric:        Behavior: Behavior normal. Behavior is cooperative.        Thought Content: Thought content normal.        Judgment: Judgment normal.          Patient has been counseled extensively about nutrition and exercise as well as the importance of adherence with medications and regular follow-up. The patient was given clear instructions to go to ER or return to medical center if symptoms don't improve, worsen or new problems develop. The patient verbalized understanding.   Follow-up: Return in about 3 months (around 04/05/2024).   Haze LELON Servant, FNP-BC [facility] [place], [place] [phone]   01/07/2024, 11:30 AM

## 2024-01-08 ENCOUNTER — Other Ambulatory Visit: Payer: Self-pay | Admitting: Nurse Practitioner

## 2024-01-08 DIAGNOSIS — R8290 Unspecified abnormal findings in urine: Secondary | ICD-10-CM

## 2024-01-08 LAB — CBC WITH DIFFERENTIAL/PLATELET
Basophils Absolute: 0.1 10*3/uL (ref 0.0–0.2)
Basos: 1 %
EOS (ABSOLUTE): 0.3 10*3/uL (ref 0.0–0.4)
Eos: 4 %
Hematocrit: 36.8 % (ref 34.0–46.6)
Hemoglobin: 12.1 g/dL (ref 11.1–15.9)
Immature Grans (Abs): 0 10*3/uL (ref 0.0–0.1)
Immature Granulocytes: 0 %
Lymphocytes Absolute: 2.4 10*3/uL (ref 0.7–3.1)
Lymphs: 30 %
MCH: 24.2 pg — ABNORMAL LOW (ref 26.6–33.0)
MCHC: 32.9 g/dL (ref 31.5–35.7)
MCV: 74 fL — ABNORMAL LOW (ref 79–97)
Monocytes Absolute: 0.5 10*3/uL (ref 0.1–0.9)
Monocytes: 7 %
Neutrophils Absolute: 4.7 10*3/uL (ref 1.4–7.0)
Neutrophils: 58 %
Platelets: 435 10*3/uL (ref 150–450)
RBC: 5.01 x10E6/uL (ref 3.77–5.28)
RDW: 16.6 % — ABNORMAL HIGH (ref 11.7–15.4)
WBC: 8.1 10*3/uL (ref 3.4–10.8)

## 2024-01-08 LAB — URINALYSIS, COMPLETE
Bilirubin, UA: NEGATIVE
Glucose, UA: NEGATIVE
Ketones, UA: NEGATIVE
Nitrite, UA: NEGATIVE
Specific Gravity, UA: 1.022 (ref 1.005–1.030)
Urobilinogen, Ur: 0.2 mg/dL (ref 0.2–1.0)
pH, UA: 6 (ref 5.0–7.5)

## 2024-01-08 LAB — MICROSCOPIC EXAMINATION
Casts: NONE SEEN /[LPF]
RBC, Urine: NONE SEEN /[HPF] (ref 0–2)
WBC, UA: >30 /[HPF] — AB (ref 0–5)

## 2024-01-08 LAB — VITAMIN D 25 HYDROXY (VIT D DEFICIENCY, FRACTURES): Vit D, 25-Hydroxy: 17 ng/mL — ABNORMAL LOW (ref 30.0–100.0)

## 2024-01-21 DIAGNOSIS — R32 Unspecified urinary incontinence: Secondary | ICD-10-CM | POA: Diagnosis not present

## 2024-01-23 ENCOUNTER — Ambulatory Visit: Payer: Medicaid Other

## 2024-01-24 ENCOUNTER — Ambulatory Visit: Payer: Medicaid Other

## 2024-01-28 ENCOUNTER — Ambulatory Visit: Payer: Medicaid Other | Attending: Family Medicine

## 2024-01-28 DIAGNOSIS — R8290 Unspecified abnormal findings in urine: Secondary | ICD-10-CM

## 2024-01-28 NOTE — Progress Notes (Signed)
 Patient in today Urine collected and Vag. Swab obtained.

## 2024-01-29 ENCOUNTER — Other Ambulatory Visit: Payer: Self-pay | Admitting: Nurse Practitioner

## 2024-01-29 ENCOUNTER — Other Ambulatory Visit (HOSPITAL_COMMUNITY)
Admission: RE | Admit: 2024-01-29 | Discharge: 2024-01-29 | Disposition: A | Discharge status: Home / Self Care | Payer: Medicaid Other | Source: Ambulatory Visit | Attending: Nurse Practitioner | Admitting: Nurse Practitioner

## 2024-01-29 DIAGNOSIS — N760 Acute vaginitis: Secondary | ICD-10-CM | POA: Insufficient documentation

## 2024-01-29 LAB — MICROSCOPIC EXAMINATION
Bacteria, UA: NONE SEEN
Casts: NONE SEEN /LPF
Epithelial Cells (non renal): NONE SEEN /HPF (ref 0–10)
RBC, Urine: NONE SEEN /HPF (ref 0–2)
WBC, UA: >30 /HPF — AB (ref 0–5)

## 2024-01-29 LAB — URINALYSIS, COMPLETE
Bilirubin, UA: NEGATIVE
Glucose, UA: NEGATIVE
Ketones, UA: NEGATIVE
Nitrite, UA: NEGATIVE
Specific Gravity, UA: 1.011 (ref 1.005–1.030)
Urobilinogen, Ur: 0.2 mg/dL (ref 0.2–1.0)
pH, UA: 7 (ref 5.0–7.5)

## 2024-01-30 ENCOUNTER — Telehealth: Payer: Self-pay

## 2024-01-30 LAB — CERVICOVAGINAL ANCILLARY ONLY
Bacterial Vaginitis (gardnerella): POSITIVE — AB
Candida Glabrata: NEGATIVE
Candida Vaginitis: NEGATIVE
Chlamydia: NEGATIVE
Comment: NEGATIVE
Comment: NEGATIVE
Comment: NEGATIVE
Comment: NEGATIVE
Comment: NEGATIVE
Comment: NORMAL
Neisseria Gonorrhea: NEGATIVE
Trichomonas: NEGATIVE

## 2024-01-30 LAB — URINE CULTURE

## 2024-01-30 NOTE — Telephone Encounter (Signed)
 Cytology department was called and Ruthell Rummage states that Corrie Dandy is off today and no notes are in the patient's chart. She also states that the specimen is in processing.     Copied from CRM [id number]. Topic: General - Other >> Jan 29, 2024  8:42 AM Fonda Kinder J wrote: Reason for CRM: An rep from Cytology called in asking to speak with someone in the office regarding a specimen received for the pt, she states he is unable to process it until she speaks with someone Callback # Corrie Dandy [phone]

## 2024-02-02 ENCOUNTER — Other Ambulatory Visit: Payer: Self-pay | Admitting: Nurse Practitioner

## 2024-02-02 ENCOUNTER — Encounter: Payer: Self-pay | Admitting: Nurse Practitioner

## 2024-02-02 DIAGNOSIS — B9689 Other specified bacterial agents as the cause of diseases classified elsewhere: Secondary | ICD-10-CM

## 2024-02-02 DIAGNOSIS — N760 Acute vaginitis: Secondary | ICD-10-CM

## 2024-02-02 DIAGNOSIS — R399 Unspecified symptoms and signs involving the genitourinary system: Secondary | ICD-10-CM

## 2024-02-02 MED ORDER — SULFAMETHOXAZOLE-TRIMETHOPRIM 800-160 MG PO TABS: 1.0000 | ORAL_TABLET | Freq: Two times a day (BID) | ORAL | 0 refills | Status: AC

## 2024-02-02 MED ORDER — METRONIDAZOLE 500 MG PO TABS
500.0000 mg | ORAL_TABLET | Freq: Two times a day (BID) | ORAL | 0 refills | Status: AC
Start: 2024-02-02 — End: 2024-02-09

## 2024-02-10 ENCOUNTER — Ambulatory Visit: Payer: Medicaid Other | Admitting: Dietician

## 2024-02-11 ENCOUNTER — Encounter: Payer: Self-pay | Admitting: Nurse Practitioner

## 2024-02-26 DIAGNOSIS — R32 Unspecified urinary incontinence: Secondary | ICD-10-CM | POA: Diagnosis not present

## 2024-02-27 ENCOUNTER — Ambulatory Visit (HOSPITAL_COMMUNITY)
Admission: EM | Admit: 2024-02-27 | Discharge: 2024-02-27 | Disposition: A | Discharge status: Home / Self Care | Attending: Physician Assistant | Admitting: Physician Assistant

## 2024-02-27 ENCOUNTER — Encounter (HOSPITAL_COMMUNITY): Payer: Self-pay

## 2024-02-27 DIAGNOSIS — K047 Periapical abscess without sinus: Secondary | ICD-10-CM

## 2024-02-27 DIAGNOSIS — K029 Dental caries, unspecified: Secondary | ICD-10-CM | POA: Diagnosis not present

## 2024-02-27 MED ORDER — ACETAMINOPHEN 325 MG PO TABS
ORAL_TABLET | ORAL | Status: AC
  Filled 2024-02-27: qty 2

## 2024-02-27 MED ORDER — AMOXICILLIN-POT CLAVULANATE 875-125 MG PO TABS: 1.0000 | ORAL_TABLET | Freq: Two times a day (BID) | ORAL | 0 refills | Status: DC

## 2024-02-27 MED ORDER — ACETAMINOPHEN 325 MG PO TABS
650.0000 mg | ORAL_TABLET | Freq: Once | ORAL | Status: AC
  Administered 2024-02-27: 650 mg via ORAL

## 2024-02-27 NOTE — ED Provider Notes (Signed)
 [facility]    CSN: [id number] Arrival date & time: 02/27/24  1018      History   Chief Complaint Chief Complaint  Patient presents with   Dental Pain    HPI Sabrina Larson is a 48 y.o. female.   HPI  She reports she chipped her tooth and her filling came out a few months ago  She states a few days ago she started to develop swelling and pain in the lower right portion of her mouth She reports decreased PO intake due to pain with chewing  Interventions: Motrin 800 mg and Tylenol but this did not provide much relief. She has also applied ice packs to the area She reports she last took tylenol at 3-4 AM this morning    Past Medical History:  Diagnosis Date   Anemia    Blood transfusion without reported diagnosis    COVID 10/2022   resolved   GERD (gastroesophageal reflux disease)    Hypertension    Pre-diabetes     Patient Active Problem List   Diagnosis Date Noted   Retained products of conception after miscarriage 12/13/2022   Anemia 12/02/2022   Seasonal and perennial allergic rhinoconjunctivitis 11/29/2020   Other allergic rhinitis 08/30/2020   Shortness of breath 08/30/2020   Burn scar contracture of upper arm 07/26/2020   Ectopic pregnancy 03/19/2019   History of gastric ulcer 12/24/2017   GERD (gastroesophageal reflux disease) 01/22/2017   Hypertension 07/05/2016   Prediabetes 05/02/2016    Past Surgical History:  Procedure Laterality Date   CESAREAN SECTION     2014, 1995   DILATION AND CURETTAGE OF UTERUS     8 total in office and outpatient   DILATION AND EVACUATION N/A 12/13/2022   Procedure: DILATATION AND EVACUATION;  Surgeon: Warden Fillers, MD;  Location: [facility];  Service: Gynecology;  Laterality: N/A;   OPERATIVE ULTRASOUND N/A 12/13/2022   Procedure: OPERATIVE ULTRASOUND;  Surgeon: Warden Fillers, MD;  Location: [facility] OR;  Service: Gynecology;  Laterality: N/A;   SKIN GRAFT     at age 42    OB History      Gravida  61   Para  6   Term  6   Preterm  0   AB  10   Living  6      SAB  1   IAB  9   Ectopic  0   Multiple  0   Live Births           Obstetric Comments  TSVD x 4. Term c-section x 2.  6 surgical TABs, 2 medicated TABs          Home Medications    Prior to Admission medications   Medication Sig Start Date End Date Taking? Authorizing Provider  amoxicillin-clavulanate (AUGMENTIN) 875-125 MG tablet Take 1 tablet by mouth every 12 (twelve) hours. 02/27/24  Yes Nainika Newlun E, PA-C  ferrous sulfate (FERROUSUL) 325 (65 FE) MG tablet Take 1 tablet (325 mg total) by mouth every other day. 10/01/23   Claiborne Rigg, NP  Vitamin D, Ergocalciferol, (DRISDOL) 1.25 MG (50000 UNIT) CAPS capsule Take 1 capsule (50,000 Units total) by mouth every 7 (seven) days. 01/07/24   Claiborne Rigg, NP    Family History Family History  Problem Relation Age of Onset   Colon polyps Mother    Colon cancer Mother 59   Asthma Mother    Hypertension Mother    Depression Mother  Diabetes Mother    Drug abuse Mother    Asthma Brother    Esophageal cancer Neg Hx    Rectal cancer Neg Hx    Stomach cancer Neg Hx     Social History Social History   Tobacco Use   Smoking status: Former    Current packs/day: 0.00    Types: Cigarettes, E-cigarettes    Quit date: 07/04/2021    Years since quitting: 2.6   Smokeless tobacco: Never  Vaping Use   Vaping status: Some Days   Substances: Flavoring  Substance Use Topics   Alcohol use: Not Currently    Comment: rarely 1-2 glasses per year   Drug use: Yes    Types: Marijuana    Comment: daily - last used 11-30-23     Allergies   Lactose   Review of Systems Review of Systems  Constitutional:  Negative for chills, fatigue and fever.  HENT:  Positive for dental problem and drooling. Negative for trouble swallowing.      Physical Exam Triage Vital Signs ED Triage Vitals  Encounter Vitals Group     BP 02/27/24 1054 (!)  157/96     Systolic BP Percentile --      Diastolic BP Percentile --      Pulse Rate 02/27/24 1054 77     Resp 02/27/24 1054 16     Temp 02/27/24 1054 98.1 F (36.7 C)     Temp Source 02/27/24 1054 Oral     SpO2 02/27/24 1054 97 %     Weight 02/27/24 1053 210 lb (95.3 kg)     Height 02/27/24 1053 5' 5.5" (1.664 m)     Head Circumference --      Peak Flow --      Pain Score 02/27/24 1050 8     Pain Loc --      Pain Education --      Exclude from Growth Chart --    No data found.  Updated Vital Signs BP (!) 157/96 (BP Location: Left Arm)   Pulse 77   Temp 98.1 F (36.7 C) (Oral)   Resp 16   Ht 5' 5.5" (1.664 m)   Wt 210 lb (95.3 kg)   LMP 02/07/2024 (Exact Date)   SpO2 97%   BMI 34.41 kg/m   Visual Acuity Right Eye Distance:   Left Eye Distance:   Bilateral Distance:    Right Eye Near:   Left Eye Near:    Bilateral Near:     Physical Exam Vitals reviewed.  Constitutional:      General: She is awake.     Appearance: Normal appearance. She is well-developed and well-groomed.  HENT:     Head: Normocephalic and atraumatic.     Mouth/Throat:     Lips: Pink.     Mouth: Mucous membranes are moist.     Dentition: Abnormal dentition. Dental tenderness, gingival swelling, dental caries and dental abscesses present.     Tongue: No lesions. Tongue does not deviate from midline.     Palate: No mass and lesions.     Pharynx: Uvula midline. No pharyngeal swelling, oropharyngeal exudate, posterior oropharyngeal erythema, uvula swelling or postnasal drip.   Musculoskeletal:     Cervical back: Normal range of motion and neck supple.  Neurological:     Mental Status: She is alert.  Psychiatric:        Behavior: Behavior is cooperative.      UC Treatments / Results  Labs (all labs  ordered are listed, but only abnormal results are displayed) Labs Reviewed - No data to display  EKG   Radiology No results found.  Procedures Procedures (including critical care  time)  Medications Ordered in UC Medications  acetaminophen (TYLENOL) tablet 650 mg (650 mg Oral Given 02/27/24 1205)    Initial Impression / Assessment and Plan / UC Course  I have reviewed the triage vital signs and the nursing notes.  Pertinent labs & imaging results that were available during my care of the patient were reviewed by me and considered in my medical decision making (see chart for details).      Final Clinical Impressions(s) / UC Diagnoses   Final diagnoses:  Dental abscess  Pain due to dental caries  Infected dental caries   Patient presents today with concerns for dental pain.  She reports a few months ago one of her teeth chipped and they feeling appear to come out.  She reports about 3 days ago she started to have intense pain along this area as well as swelling to the gumline and jaw.  Physical exam is notable for cracked tooth at the first molar along with associated gum swelling and tenderness.  She has notable tenderness along the jaw with external evaluation as well as mild swelling.  She reports minimal improvement in pain with over-the-counter Tylenol and ibuprofen.  At this time I am concerned for potential dental abscess so we will send in Augmentin p.o. twice daily x 7 days.  Recommend she continues to alternate Tylenol and ibuprofen as needed.  Tylenol 650 mg given here in clinic to assist with pain management as her last dose was about 8 hours ago.  Recommend close follow-up with dental provider for more definitive management.  Community dental office information was provided in after visit summary.  ED and return cautions reviewed and provided in after visit summary.  Follow-up as needed    Discharge Instructions      At this time concerned that you have an infection due to a cavity in one of your teeth.  The gum surrounding this tooth is swollen and very tender.  I have sent in a medication called Augmentin for you to take twice per day for 7 days.  This  should help treat the infection and reduce some of the pain and swelling.  I recommend that you continue to alternate Tylenol and ibuprofen as needed for pain management.  You can also apply Orajel to the area once tolerated.  Please make sure that you are staying hydrated and try to chew on the opposite side of your mouth until you are evaluated by a dentist.  We have included available dental providers in your after visit summary if needed.  Please call one of them soon to schedule an appointment as you will likely continue to develop infections in the tooth until definitive management is undertaken. If any point you start to develop fevers, chills, significant swelling of the jaw, or severe pain, bad taste in the mouth or drainage that looks like pus please return here or go to the emergency room for further evaluation management.     ED Prescriptions     Medication Sig Dispense Auth. Provider   amoxicillin-clavulanate (AUGMENTIN) 875-125 MG tablet Take 1 tablet by mouth every 12 (twelve) hours. 14 tablet Spyros Winch E, PA-C      PDMP not reviewed this encounter.   Providence Crosby, PA-C 02/27/24 1219

## 2024-02-27 NOTE — ED Triage Notes (Signed)
 Patient here today with c/o right lower dental pain X 1 day. Patient noticed swelling 3 days ago. She has been taking Tylenol and Ibuprofen with no relief.

## 2024-02-27 NOTE — Discharge Instructions (Addendum)
 At this time concerned that you have an infection due to a cavity in one of your teeth.  The gum surrounding this tooth is swollen and very tender.  I have sent in a medication called Augmentin for you to take twice per day for 7 days.  This should help treat the infection and reduce some of the pain and swelling.  I recommend that you continue to alternate Tylenol and ibuprofen as needed for pain management.  You can also apply Orajel to the area once tolerated.  Please make sure that you are staying hydrated and try to chew on the opposite side of your mouth until you are evaluated by a dentist.  We have included available dental providers in your after visit summary if needed.  Please call one of them soon to schedule an appointment as you will likely continue to develop infections in the tooth until definitive management is undertaken. If any point you start to develop fevers, chills, significant swelling of the jaw, or severe pain, bad taste in the mouth or drainage that looks like pus please return here or go to the emergency room for further evaluation management.

## 2024-03-31 DIAGNOSIS — R32 Unspecified urinary incontinence: Secondary | ICD-10-CM | POA: Diagnosis not present

## 2024-04-10 ENCOUNTER — Telehealth: Payer: Self-pay

## 2024-04-10 DIAGNOSIS — I10 Essential (primary) hypertension: Secondary | ICD-10-CM

## 2024-04-20 ENCOUNTER — Telehealth: Payer: Self-pay | Admitting: *Deleted

## 2024-04-20 NOTE — Progress Notes (Signed)
 Complex Care Management Note  Care Guide Note 04/20/2024 Name: Sabrina Larson MRN: [id number] DOB: 01/22/1976  Sabrina Larson is a 48 y.o. year old female who sees Collins Dean, NP for primary care. I reached out to [employer] by phone today to offer complex care management services.  [name] was given information about Complex Care Management services today including:   The Complex Care Management services include support from the care team which includes your Nurse Care Manager, Clinical Social Worker, or Pharmacist.  The Complex Care Management team is here to help remove barriers to the health concerns and goals most important to you. Complex Care Management services are voluntary, and the patient may decline or stop services at any time by request to their care team member.   Complex Care Management Consent Status: Patient agreed to services and verbal consent obtained.   Follow up plan:  Telephone appointment with complex care management team member scheduled for:  04/22/24  Encounter Outcome:  Patient Scheduled  Barnie Bora  [facility]  [address] [facility]  Direct Dial : [phone]  Fax [phone]

## 2024-04-22 ENCOUNTER — Other Ambulatory Visit: Payer: Self-pay

## 2024-04-22 NOTE — Patient Instructions (Signed)
 Visit Information  Ms. Sabrina Larson was given information about Medicaid Managed Care team care coordination services as a part of their Healthy [facility] Medicaid benefit. Sabrina Larson verbally consented to engagement with the [facility] Managed Care team.   If you are experiencing a medical emergency, please call 911 or report to your local emergency department or urgent care.   If you have a non-emergency medical problem during routine business hours, please contact your provider's office and ask to speak with a nurse.   For questions related to your Healthy [facility] health plan, please call: [phone] or visit the homepage here: [email or web address]  If you would like to schedule transportation through your Healthy [facility] plan, please call the following number at least 2 days in advance of your appointment: [phone]  For information about your ride after you set it up, call Ride Assist at [phone]. Use this number to activate a Will Call pickup, or if your transportation is late for a scheduled pickup. Use this number, too, if you need to make a change or cancel a previously scheduled reservation.  If you need transportation services right away, call [phone]. The after-hours call center is staffed 24 hours to handle ride assistance and urgent reservation requests (including discharges) 365 days a year. Urgent trips include sick visits, hospital discharge requests and life-sustaining treatment.  Call the [facility] Line at [phone], at any time, 24 hours a day, 7 days a week. If you are in danger or need immediate medical attention call 911.  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 Assistance establishing behavioral health services  Goal: Over the next 30 days the Patient will attend all scheduled medical appointments: PCP as evidenced by completed visit notes uploaded to EMR        work with social worker to address Housing barriers related to the management of SDOH needs as evidenced by review of electronic medical record and patient or social worker report     Establish behavioral health care with the [employer] Communities program through [place]  Interventions:   Evaluation of current treatment plan related to behavioral health needs, Housing barriers self-management and patient's adherence to plan as established by provider. Discussed plans with patient for ongoing care management follow up and provided patient with direct contact information for care management team Provided patient and/or caregiver with   information about [facility], [facility] Communities program through [employer] Social Work referral for housing barriers - appointment scheduled 04/23/24 Discussed plans with patient for ongoing care management follow up and provided patient with direct contact information for care management team Advised patient to discuss pain in bilateral legs with provider Screening for signs and symptoms of depression related to chronic disease state  Referral sent to [facility], [facility] Commun[facility] program  Patient Self-Care Activities:  Attend all scheduled provider appointments Call provider office for new concerns or questions  Work with the social worker to address care coordination needs and will continue to work with the clinical  team to address health care and disease management related needs call the Suicide and Crisis Lifeline: 988 call 1-800-273-TALK (toll free,  24 hour hotline) if experiencing a Mental Health or Behavioral Health Crisis  Plan:  Telephone follow up appointment with care management team member scheduled for:  04/29/24 at 11:30 AM The patient has been provided with contact information for the care management team and has been advised to call with any health related questions or concerns.              Please see education materials related to [employer], Strong Communities Program at [place] provided by [employer].  Patient verbalizes understanding of instructions and care plan provided today and agrees to view in MyChart. Active MyChart status and patient understanding of how to access instructions and care plan via MyChart confirmed with patient.     RN Care Manager will send referral to [facility] Commun[facility] Program Social Worker will contact patient as scheduled 04/23/24 to discuss housing barriers.  Telephone follow up appointment with Managed Medicaid care management team member scheduled for: 04/29/24 at 11:30 AM  Sabrina Fish, RN MSN [place]  [facility] RN Care Manager Direct Dial : [phone]  Fax: [phone]   Following is a copy of your plan of care:  There are no care plans that you recently modified to display for this patient.  Please click on this link for more information regarding the Strong Mind[facility], Strong Communities program at [facility]:   [email or web address]

## 2024-04-22 NOTE — Patient Outreach (Signed)
 Complex Care Management   Visit Note  04/22/2024  Name:  Sabrina Larson MRN: [id number] DOB: May 26, 1976  Situation: Referral received for Complex Care Management related to SDOH Barriers:  Housing lack of housing and establishing behavioral health care I obtained verbal consent from Patient.  Visit completed with Sabrina Larson  on the phone  Background:   Past Medical History:  Diagnosis Date   Anemia    Blood transfusion without reported diagnosis    COVID 10/2022   resolved   GERD (gastroesophageal reflux disease)    Hypertension    Pre-diabetes     Assessment: Patient Reported Symptoms:  Cognitive Cognitive Status: Able to follow simple commands, Alert and oriented to person, place, and time, Normal speech and language skills Cognitive/Intellectual Conditions Management [RPT]: None reported or documented in medical history or problem list   Health Maintenance Behaviors: Annual physical exam, Exercise, Healthy diet, Spiritual practice(s) Health Facilitated by: Healthy diet, Prayer/meditation  Neurological Neurological Review of Symptoms: No symptoms reported    HEENT HEENT Symptoms Reported: Mouth or teeth pain HEENT Conditions: Tooth problem(s), Vision problem(s) Tooth Problems: pain (Patient reports she has a tooth on the R lower part of her mouth that needs to be removed. She denies pain at this time, but reports that pain comes and goes. She has received antibiotics for possible dental abscess.) Vision Problems: blindness/vision loss (Wears eyeglasses) Tooth problem(s), Vision problem(s)  Cardiovascular Cardiovascular Symptoms Reported: No symptoms reported Does patient have uncontrolled Hypertension?: No Cardiovascular Conditions: Hypertension Cardiovascular Management Strategies: Exercise, Diet modification, Routine screening Cardiovascular Comment: Hypertension controlled without medications.  Respiratory Respiratory Symptoms Reported: No symptoms  reported    Endocrine Patient reports the following symptoms related to hypoglycemia or hyperglycemia : No symptoms reported Is patient diabetic?: No    Gastrointestinal Gastrointestinal Symptoms Reported: No symptoms reported (Last BM yesterday) Additional Gastrointestinal Details: Patient reports appetite is good. She focuses on a healthy diet, does not drink a lot of soda.   Nutrition Risk Screen (CP): No indicators present  Genitourinary Genitourinary Symptoms Reported: No symptoms reported    Integumentary Integumentary Symptoms Reported: No symptoms reported    Musculoskeletal Musculoskelatal Symptoms Reviewed: Difficulty walking Additional Musculoskeletal Details: Patient reports having to walk slower due to pain in bilateral legs and knees, L>R. She has made an appointment at primary provider office on Friday for evaluation. Musculoskeletal Management Strategies: Adequate rest, Exercise Musculoskeletal Comment: Patient reports she fell yesterday walking down the stairs. She landed on her bottom. Falls in the past year?: Yes Number of falls in past year: 1 or less Was there an injury with Fall?: No Fall Risk Category Calculator: 1 Patient Fall Risk Level: Low Fall Risk Patient at Risk for Falls Due to: No Fall Risks Fall risk Follow up: Falls evaluation completed  Psychosocial Psychosocial Symptoms Reported: No symptoms reported Additional Psychological Details: Patient reports she was seeing a therapist when she was employed (paid for by her insurance at that time). She has not been able to follow-up due to loss of insurance. She denies current symptoms of depression or anxiety, but reports that she would like to be reestablished with a provider so that it does not become an issue. Behavioral Management Strategies: Support system Behavioral Health Comment: Referral for [facility], [facility] Communities through [employer] sent. Patient provided with information about program through  MyChart. Major Change/Loss/Stressor/Fears (CP): [employer] to [employer] with Loss/Stress/Change: Spiritual practice(s) Quality of Family Relationships: supportive Do you feel physically threatened by others?: No  04/22/2024   11:21 AM  Depression screen PHQ 2/9  Decreased Interest 0  Down, Depressed, Hopeless 0  PHQ - 2 Score 0    There were no vitals filed for this visit.  Medications Reviewed Today     Reviewed by Valaria Garland, RN (Registered Nurse) on 04/22/24 at 1126  Med List Status: <None>   Medication Order Taking? Sig Documenting Provider Last Dose Status Informant  amoxicillin -clavulanate (AUGMENTIN ) 875-125 MG tablet [id number]  Take 1 tablet by mouth every 12 (twelve) hours. Mecum, Erin E, PA-C  Active   ferrous sulfate  (FERROUSUL) 325 (65 FE) MG tablet [id number]  Take 1 tablet (325 mg total) by mouth every other day. Collins Dean, NP  Active Self  Vitamin D , Ergocalciferol , (DRISDOL ) 1.25 MG (50000 UNIT) CAPS capsule [id number]  Take 1 capsule (50,000 Units total) by mouth every 7 (seven) days. Collins Dean, NP  Active             Recommendation:   Referral to: [facility] program at [employer] appointment 04/23/24 at 12 PM  Follow Up Plan:   Telephone follow up appointment date/time:  04/29/24 at 11:30 AM  Theodora Fish, RN MSN [place]  [facility] RN Care Manager Direct Dial : [phone]  Fax: [phone]

## 2024-04-23 ENCOUNTER — Other Ambulatory Visit: Payer: Self-pay

## 2024-04-23 NOTE — Patient Instructions (Signed)
 Visit Information  Ms. Ferraz was given information about Medicaid Managed Care team care coordination services as a part of their Healthy [facility] Medicaid benefit. Virlinda Grimmer Pals verbally consented to engagement with the [facility] Managed Care team.   If you are experiencing a medical emergency, please call 911 or report to your local emergency department or urgent care.   If you have a non-emergency medical problem during routine business hours, please contact your provider's office and ask to speak with a nurse.   For questions related to your Healthy [facility] Behavioral health plan, please call: [phone] or visit the homepage here: [email or web address]  If you would like to schedule transportation through your Healthy [facility] plan, please call the following number at least 2 days in advance of your appointment: [phone]  For information about your ride after you set it up, call Ride Assist at [phone]. Use this number to activate a Will Call pickup, or if your transportation is late for a scheduled pickup. Use this number, too, if you need to make a change or cancel a previously scheduled reservation.  If you need transportation services right away, call [phone]. The after-hours call center is staffed 24 hours to handle ride assistance and urgent reservation requests (including discharges) 365 days a year. Urgent trips include sick visits, hospital discharge requests and life-sustaining treatment.  Call the [facility] Line at [phone], at any time, 24 hours a day, 7 days a week. If you are in danger or need immediate medical attention call 911.  If you would like help to quit smoking, call 1-800-QUIT-NOW ((515)300-6786) OR Espaol: 1-855-Djelo-Ya (8-841-660-6301) o para ms informacin haga clic aqu or Text READY to 601-093 to register via text  Ms. Galgano - following are the goals we discussed in your visit  today:   Goals Addressed             This Visit's Progress    BSW VBCI Social Work Care Plan       Problems:   Corporate treasurer , Geophysicist/field seismologist , and Housing   CSW Clinical Goal(s):   Over the next 30 days the Patient will work with resources provided to address needs related to Housing, Education officer, community, food and financial strain..  Interventions:  Social Determinants of Health in Patient with community resources: SDOH assessments completed: Corporate treasurer , Geophysicist/field seismologist , and Housing  Evaluation of current treatment plan related to unmet needs Housing resources:affordable emailed to emailed to address on file, SW also included resources for the dentist, food pantries and job boards.  Patient Goals/Self-Care Activities:  Utilize resources once received.   Plan:   SW will follow up on 05/07/24 at 11:30am         Social Worker will follow up on 05/07/24.   Valora Gear, Florestine Hurl, MHA [place]  [facility] Social Worker, Population Health [phone]   Following is a copy of your plan of care:  There are no care plans that you recently modified to display for this patient.

## 2024-04-23 NOTE — Patient Outreach (Signed)
 Complex Care Management   Visit Note  04/23/2024  Name:  Sabrina Larson MRN: [id number] DOB: Apr 25, 1976  Situation: Referral received for Complex Care Management related to SDOH Barriers:  Housing currently living with a friend Food insecurity Financial Resource Strain I obtained verbal consent from Patient.  Visit completed with patient  on the phone  Background:   Past Medical History:  Diagnosis Date   Anemia    Blood transfusion without reported diagnosis    COVID 10/2022   resolved   GERD (gastroesophageal reflux disease)    Hypertension    Pre-diabetes     Assessment: SW completed a telephone outreach with patient, she is looking for resources for food, housing, Education officer, community and job boards. Patient is currently traveling daily from [place] to [place]. SW emailed affordable housing resources, Stage manager, job Designer, jewellery to email on file.   SDOH Interventions    Flowsheet Row Patient Outreach Telephone from 04/23/2024 in [place] POPULATION HEALTH DEPARTMENT Patient Outreach Telephone from 04/22/2024 in [place] POPULATION HEALTH DEPARTMENT  SDOH Interventions    Food Insecurity Interventions Community Resources Provided Other (Comment)  [Patient has an appointment with BSW scheduled 04/23/24. She currently receives food stamps and reports they are raising her amoutn next month. She would like a list a food pantries.]  Housing Interventions Community Resources Provided Other (Comment)  [Patient has appointment with BSW 04/23/24]  Transportation Interventions -- Intervention Not Indicated  Utilities Interventions -- Intervention Not Indicated  Financial Strain Interventions Community Resources Provided --         Recommendation:   No recommendations at this time.  Follow Up Plan:   Telephone follow-up 05/07/24 11:30  Valora Gear, Florestine Hurl, MHA [place]  [facility] Social Worker, Population Health [phone]

## 2024-04-24 ENCOUNTER — Other Ambulatory Visit: Payer: Self-pay | Admitting: Nurse Practitioner

## 2024-04-24 ENCOUNTER — Ambulatory Visit: Admitting: Pharmacist

## 2024-04-24 ENCOUNTER — Ambulatory Visit: Admitting: Nurse Practitioner

## 2024-04-24 ENCOUNTER — Encounter: Payer: Self-pay | Admitting: Nurse Practitioner

## 2024-04-24 VITALS — BP 154/88 | HR 68 | Temp 97.4°F | Wt 204.0 lb

## 2024-04-24 DIAGNOSIS — R300 Dysuria: Secondary | ICD-10-CM | POA: Diagnosis not present

## 2024-04-24 DIAGNOSIS — I10 Essential (primary) hypertension: Secondary | ICD-10-CM | POA: Diagnosis not present

## 2024-04-24 DIAGNOSIS — N3001 Acute cystitis with hematuria: Secondary | ICD-10-CM

## 2024-04-24 DIAGNOSIS — M25561 Pain in right knee: Secondary | ICD-10-CM | POA: Diagnosis not present

## 2024-04-24 DIAGNOSIS — M25562 Pain in left knee: Secondary | ICD-10-CM

## 2024-04-24 DIAGNOSIS — G8929 Other chronic pain: Secondary | ICD-10-CM | POA: Diagnosis not present

## 2024-04-24 LAB — POCT URINE DIPSTICK
Bilirubin, UA: NEGATIVE
Glucose, UA: NEGATIVE mg/dL
Ketones, POC UA: NEGATIVE mg/dL
Nitrite, UA: NEGATIVE
POC PROTEIN,UA: 30 — AB
Spec Grav, UA: 1.02 (ref 1.010–1.025)
Urobilinogen, UA: 0.2 U/dL
pH, UA: 6.5 (ref 5.0–8.0)

## 2024-04-24 MED ORDER — CYCLOBENZAPRINE HCL 5 MG PO TABS: 5.0000 mg | ORAL_TABLET | Freq: Three times a day (TID) | ORAL | 0 refills | Status: AC | PRN

## 2024-04-24 MED ORDER — SULFAMETHOXAZOLE-TRIMETHOPRIM 800-160 MG PO TABS
1.0000 | ORAL_TABLET | Freq: Two times a day (BID) | ORAL | 0 refills | Status: DC
Start: 2024-04-24 — End: 2024-05-26

## 2024-04-24 MED ORDER — KETOROLAC TROMETHAMINE 30 MG/ML IJ SOLN
30.0000 mg | Freq: Once | INTRAMUSCULAR | Status: AC
  Administered 2024-04-24: 30 mg via INTRAMUSCULAR

## 2024-04-24 MED ORDER — IBUPROFEN 600 MG PO TABS: 600.0000 mg | ORAL_TABLET | Freq: Three times a day (TID) | ORAL | 0 refills | Status: AC | PRN

## 2024-04-24 MED ORDER — METHYLPREDNISOLONE NA SUC (PF) 40 MG IJ SOLR
40.0000 mg | Freq: Once | INTRAMUSCULAR | Status: AC
  Administered 2024-04-24: 40 mg via INTRAMUSCULAR

## 2024-04-24 MED ORDER — AMLODIPINE BESYLATE 2.5 MG PO TABS: 2.5000 mg | ORAL_TABLET | Freq: Every day | ORAL | 1 refills | Status: DC

## 2024-04-24 NOTE — Assessment & Plan Note (Addendum)
 Stated that she was on medication in the past but not currently Start amlodipine 2.5 mg daily, monitor blood pressure at home keep a log and follow-up with PCP in 4 weeks DASH diet and commitment to daily physical activity for a minimum of 30 minutes discussed and encouraged, as a part of hypertension management. The importance of attaining a healthy weight is also discussed.      04/24/2024    9:29 AM 04/24/2024    9:21 AM 02/27/2024   10:54 AM 02/27/2024   10:53 AM 01/07/2024   11:02 AM 12/02/2023    3:42 PM 12/02/2023    3:34 PM  BP/Weight  Systolic BP 154 151 157  134 128 127  Diastolic BP 88 82 96  85 85 77  Wt. (Lbs)  204  210 205.8    BMI  33.43 kg/m2  34.41 kg/m2 32.72 kg/m2

## 2024-04-24 NOTE — Assessment & Plan Note (Signed)
 Take ibuprofen  with food and alternate with Tylenol  650 mg every 6 hours as needed.  Application of heat, use of knee brace encouraged - ibuprofen  (ADVIL ) 600 MG tablet; Take 1 tablet (600 mg total) by mouth every 8 (eight) hours as needed.  Dispense: 30 tablet; Refill: 0 - cyclobenzaprine  (FLEXERIL ) 5 MG tablet; Take 1 tablet (5 mg total) by mouth 3 (three) times daily as needed.  Dispense: 30 tablet; Refill: 0 - DG Knee Complete 4 Views Right; Future - DG Knee Complete 4 Views Left; Future - ketorolac  (TORADOL ) 30 MG/ML injection 30 mg - methylPREDNISolone  sodium succinate (SOLU-MEDROL ) 40 MG injection 40 mg

## 2024-04-24 NOTE — Assessment & Plan Note (Signed)
 Urinalysis    Component Value Date/Time   COLORURINE COLORLESS (A) 11/27/2022 2120   APPEARANCEUR Clear 01/28/2024 1101   LABSPEC 1.010 11/27/2022 2120   PHURINE 6.5 11/27/2022 2120   GLUCOSEU Negative 01/28/2024 1101   HGBUR LARGE (A) 11/27/2022 2120   BILIRUBINUR negative 04/24/2024 1351   BILIRUBINUR Negative 01/28/2024 1101   KETONESUR negative 04/24/2024 1351   KETONESUR NEGATIVE 11/27/2022 2120   PROTEINUR Trace 01/28/2024 1101   PROTEINUR TRACE (A) 11/27/2022 2120   UROBILINOGEN 0.2 04/24/2024 1351   UROBILINOGEN 1.0 12/09/2018 1308   NITRITE Negative 04/24/2024 1351   NITRITE Negative 01/28/2024 1101   NITRITE NEGATIVE 11/27/2022 2120   LEUKOCYTESUR Moderate (2+) (A) 04/24/2024 1351   LEUKOCYTESUR 3+ (A) 01/28/2024 1101   LEUKOCYTESUR MODERATE (A) 11/27/2022 2120  Bactrim  DS 1 tablet twice daily for 3 days ordered

## 2024-04-24 NOTE — Assessment & Plan Note (Signed)
 Take ibuprofen  with food and alternate with Tylenol  650 mg every 6 hours as needed.  Application of heat, use of knee brace encouraged - ibuprofen  (ADVIL ) 600 MG tablet; Take 1 tablet (600 mg total) by mouth every 8 (eight) hours as needed.  Dispense: 30 tablet; Refill: 0 - cyclobenzaprine  (FLEXERIL ) 5 MG tablet; Take 1 tablet (5 mg total) by mouth 3 (three) times daily as needed.  Dispense: 30 tablet; Refill: 0 - DG Knee Complete 4 Views Right; Future - DG Knee Complete 4 Views Left; Future - ketorolac  (TORADOL ) 30 MG/ML injection 30 mg - methylPREDNISolone  sodium succinate (SOLU-MEDROL ) 40 MG injection 40 mg Side effect of drowsiness with Flexeril  reviewed encouraged to take medication at bedtime if drowsiness occur

## 2024-04-24 NOTE — Patient Instructions (Addendum)
 You were given Toradol  30 mg injection and Solu-Medrol  40 mg injection in the office today From tomorrow please take ibuprofen  600 mg 3 times daily as needed alternate with Tylenol  650 mg every 6 hours as needed.  Take ibuprofen  with food Take Flexeril  5 mg 3 times daily as needed I also encourage use of knee brace and stretching exercises heat or ice  Please get your x-ray done at the radiology department at [facility]  Please follow-up with your PCP for your high blood pressure Around 3 times per week, check your blood pressure 2 times per day. once in the morning and once in the evening. The readings should be at least one minute apart. Write down these values and bring them to your next nurse visit/appointment.  When you check your BP, make sure you have been doing something calm/relaxing 5 minutes prior to checking. Both feet should be flat on the floor and you should be sitting. Use your left arm and make sure it is in a relaxed position (on a table), and that the cuff is at the approximate level/height of your heart.    1. Chronic pain of left knee (Primary)  - ibuprofen  (ADVIL ) 600 MG tablet; Take 1 tablet (600 mg total) by mouth every 8 (eight) hours as needed.  Dispense: 30 tablet; Refill: 0 - cyclobenzaprine  (FLEXERIL ) 5 MG tablet; Take 1 tablet (5 mg total) by mouth 3 (three) times daily as needed.  Dispense: 30 tablet; Refill: 0 - DG Knee Complete 4 Views Right; Future - DG Knee Complete 4 Views Left; Future  2. Acute pain of right knee  - ibuprofen  (ADVIL ) 600 MG tablet; Take 1 tablet (600 mg total) by mouth every 8 (eight) hours as needed.  Dispense: 30 tablet; Refill: 0 - cyclobenzaprine  (FLEXERIL ) 5 MG tablet; Take 1 tablet (5 mg total) by mouth 3 (three) times daily as needed.  Dispense: 30 tablet; Refill: 0 - DG Knee Complete 4 Views Right; Future - DG Knee Complete 4 Views Left; Future  3. Dysuria   4. Hypertension, unspecified type  - amLODipine (NORVASC)  2.5 MG tablet; Take 1 tablet (2.5 mg total) by mouth daily.  Dispense: 60 tablet; Refill: 1    1. Chronic pain of left knee (Primary)  - ibuprofen  (ADVIL ) 600 MG tablet; Take 1 tablet (600 mg total) by mouth every 8 (eight) hours as needed.  Dispense: 30 tablet; Refill: 0 - cyclobenzaprine  (FLEXERIL ) 5 MG tablet; Take 1 tablet (5 mg total) by mouth 3 (three) times daily as needed.  Dispense: 30 tablet; Refill: 0 - DG Knee Complete 4 Views Right; Future - DG Knee Complete 4 Views Left; Future  2. Acute pain of right knee  - ibuprofen  (ADVIL ) 600 MG tablet; Take 1 tablet (600 mg total) by mouth every 8 (eight) hours as needed.  Dispense: 30 tablet; Refill: 0 - cyclobenzaprine  (FLEXERIL ) 5 MG tablet; Take 1 tablet (5 mg total) by mouth 3 (three) times daily as needed.  Dispense: 30 tablet; Refill: 0 - DG Knee Complete 4 Views Right; Future - DG Knee Complete 4 Views Left; Future    It is important that you exercise regularly at least 30 minutes 5 times a week as tolerated  Think about what you will eat, plan ahead. Choose " clean, green, fresh or frozen" over canned, processed or packaged foods which are more sugary, salty and fatty. 70 to 75% of food eaten should be vegetables and fruit. Three meals at set times  with snacks allowed between meals, but they must be fruit or vegetables. Aim to eat over a 12 hour period , example 7 am to 7 pm, and STOP after  your last meal of the day. Drink water,generally about 64 ounces per day, no other drink is as healthy. Fruit juice is best enjoyed in a healthy way, by EATING the fruit.  Thanks for choosing [facility] we consider it a privelige to serve you.

## 2024-04-24 NOTE — Progress Notes (Signed)
 Acute Office Visit  Subjective:     Patient ID: Sabrina Larson, female    DOB: 1976-04-07, 48 y.o.   MRN: [id number]  Chief Complaint  Patient presents with   Knee Pain    Both pain about a 6 . Started a few weeks ago. No injury    HPI Sabrina Larson has a past medical history of Anemia, Blood transfusion without reported diagnosis, COVID (10/2022), GERD (gastroesophageal reflux disease), Hypertension, and Pre-diabetes.  Presents with complaints of bilateral knee pain.  Patient of Zelda NP  Chronic bilateral knee pain.  Patient complains of chronic bilateral knee pain, has been having pain in both knees for weeks.  She initially had pain on the left knee but has recently started having pain on the right knee.  Pain is worse with going up and down the stairs.  Takes OTC ibuprofen , Tiger balm as needed.  Has aching pain rated 6/10 on the right and 2/10 on the left  Dysuria patient complains of dysuria, cloudy urine that started some days ago.  No nausea, vomiting, abdominal pain   Review of Systems  Constitutional:  Negative for appetite change, chills, fatigue and fever.  HENT:  Negative for congestion, postnasal drip, rhinorrhea and sneezing.   Respiratory:  Negative for cough, shortness of breath and wheezing.   Cardiovascular:  Negative for chest pain, palpitations and leg swelling.  Gastrointestinal:  Negative for abdominal pain, constipation, nausea and vomiting.  Genitourinary:  Positive for dysuria, flank pain, frequency and urgency. Negative for decreased urine volume and difficulty urinating.  Musculoskeletal:  Positive for arthralgias. Negative for back pain, joint swelling and myalgias.  Skin:  Negative for color change, pallor, rash and wound.  Neurological:  Negative for dizziness, facial asymmetry, weakness, numbness and headaches.  Psychiatric/Behavioral:  Negative for behavioral problems, confusion, self-injury and suicidal ideas.         Objective:    BP  (!) 154/88   Pulse 68   Temp (!) 97.4 F (36.3 C)   Wt 204 lb (92.5 kg)   SpO2 100%   BMI 33.43 kg/m    Physical Exam Vitals and nursing note reviewed.  Constitutional:      General: She is not in acute distress.    Appearance: Normal appearance. She is obese. She is not ill-appearing, toxic-appearing or diaphoretic.  Eyes:     General: No scleral icterus.       Right eye: No discharge.        Left eye: No discharge.     Extraocular Movements: Extraocular movements intact.     Conjunctiva/sclera: Conjunctivae normal.  Cardiovascular:     Rate and Rhythm: Normal rate and regular rhythm.     Pulses: Normal pulses.     Heart sounds: Normal heart sounds. No murmur heard.    No friction rub. No gallop.  Pulmonary:     Effort: Pulmonary effort is normal. No respiratory distress.     Breath sounds: Normal breath sounds. No stridor. No wheezing, rhonchi or rales.  Chest:     Chest wall: No tenderness.  Abdominal:     General: There is no distension.     Palpations: Abdomen is soft.     Tenderness: There is no abdominal tenderness. There is no right CVA tenderness, left CVA tenderness or guarding.  Musculoskeletal:        General: Tenderness present. No swelling, deformity or signs of injury.     Right lower leg: No edema.  Left lower leg: No edema.     Comments: Tenderness on range of motion of left knee, pulses palpable pedal pulses  Skin:    General: Skin is warm and dry.     Capillary Refill: Capillary refill takes less than 2 seconds.     Coloration: Skin is not jaundiced or pale.     Findings: No bruising, erythema or lesion.  Neurological:     Mental Status: She is alert and oriented to person, place, and time.     Motor: No weakness.     Coordination: Coordination normal.     Gait: Gait normal.  Psychiatric:        Mood and Affect: Mood normal.        Behavior: Behavior normal.        Thought Content: Thought content normal.        Judgment: Judgment normal.      Results for orders placed or performed in visit on 04/24/24  POCT URINE DIPSTICK  Result Value Ref Range   Color, UA yellow yellow   Clarity, UA cloudy (A) clear   Glucose, UA negative negative mg/dL   Bilirubin, UA negative negative   Ketones, POC UA negative negative mg/dL   Spec Grav, UA 6.578 4.696 - 1.025   Blood, UA small (A) negative   pH, UA 6.5 5.0 - 8.0   POC PROTEIN,UA =30 (A) negative, trace   Urobilinogen, UA 0.2 0.2 or 1.0 E.U./dL   Nitrite, UA Negative Negative   Leukocytes, UA Moderate (2+) (A) Negative        Assessment & Plan:   Problem List Items Addressed This Visit       Cardiovascular and Mediastinum   Hypertension   Stated that she was on medication in the past but not currently Start amlodipine  2.5 mg daily, monitor blood pressure at home keep a log and follow-up with PCP in 4 weeks DASH diet and commitment to daily physical activity for a minimum of 30 minutes discussed and encouraged, as a part of hypertension management. The importance of attaining a healthy weight is also discussed.      04/24/2024    9:29 AM 04/24/2024    9:21 AM 02/27/2024   10:54 AM 02/27/2024   10:53 AM 01/07/2024   11:02 AM 12/02/2023    3:42 PM 12/02/2023    3:34 PM  BP/Weight  Systolic BP 154 151 157  134 128 127  Diastolic BP 88 82 96  85 85 77  Wt. (Lbs)  204  210 205.8    BMI  33.43 kg/m2  34.41 kg/m2 32.72 kg/m2             Relevant Medications   amLODipine  (NORVASC ) 2.5 MG tablet     Other   Acute pain of right knee   Take ibuprofen  with food and alternate with Tylenol  650 mg every 6 hours as needed.  Application of heat, use of knee brace encouraged - ibuprofen  (ADVIL ) 600 MG tablet; Take 1 tablet (600 mg total) by mouth every 8 (eight) hours as needed.  Dispense: 30 tablet; Refill: 0 - cyclobenzaprine  (FLEXERIL ) 5 MG tablet; Take 1 tablet (5 mg total) by mouth 3 (three) times daily as needed.  Dispense: 30 tablet; Refill: 0 - DG Knee Complete 4  Views Right; Future - DG Knee Complete 4 Views Left; Future - ketorolac  (TORADOL ) 30 MG/ML injection 30 mg - methylPREDNISolone  sodium succinate (SOLU-MEDROL ) 40 MG injection 40 mg Side effect of drowsiness with Flexeril   reviewed encouraged to take medication at bedtime if drowsiness occur      Relevant Medications   ibuprofen  (ADVIL ) 600 MG tablet   cyclobenzaprine  (FLEXERIL ) 5 MG tablet   Other Relevant Orders   DG Knee Complete 4 Views Right   DG Knee Complete 4 Views Left   Chronic pain of left knee - Primary   Take ibuprofen  with food and alternate with Tylenol  650 mg every 6 hours as needed.  Application of heat, use of knee brace encouraged - ibuprofen  (ADVIL ) 600 MG tablet; Take 1 tablet (600 mg total) by mouth every 8 (eight) hours as needed.  Dispense: 30 tablet; Refill: 0 - cyclobenzaprine  (FLEXERIL ) 5 MG tablet; Take 1 tablet (5 mg total) by mouth 3 (three) times daily as needed.  Dispense: 30 tablet; Refill: 0 - DG Knee Complete 4 Views Right; Future - DG Knee Complete 4 Views Left; Future - ketorolac  (TORADOL ) 30 MG/ML injection 30 mg - methylPREDNISolone  sodium succinate (SOLU-MEDROL ) 40 MG injection 40 mg        Relevant Medications   ibuprofen  (ADVIL ) 600 MG tablet   cyclobenzaprine  (FLEXERIL ) 5 MG tablet   Other Relevant Orders   DG Knee Complete 4 Views Right   DG Knee Complete 4 Views Left   Dysuria   Urinalysis    Component Value Date/Time   COLORURINE COLORLESS (A) 11/27/2022 2120   APPEARANCEUR Clear 01/28/2024 1101   LABSPEC 1.010 11/27/2022 2120   PHURINE 6.5 11/27/2022 2120   GLUCOSEU Negative 01/28/2024 1101   HGBUR LARGE (A) 11/27/2022 2120   BILIRUBINUR negative 04/24/2024 1351   BILIRUBINUR Negative 01/28/2024 1101   KETONESUR negative 04/24/2024 1351   KETONESUR NEGATIVE 11/27/2022 2120   PROTEINUR Trace 01/28/2024 1101   PROTEINUR TRACE (A) 11/27/2022 2120   UROBILINOGEN 0.2 04/24/2024 1351   UROBILINOGEN 1.0 12/09/2018 1308   NITRITE  Negative 04/24/2024 1351   NITRITE Negative 01/28/2024 1101   NITRITE NEGATIVE 11/27/2022 2120   LEUKOCYTESUR Moderate (2+) (A) 04/24/2024 1351   LEUKOCYTESUR 3+ (A) 01/28/2024 1101   LEUKOCYTESUR MODERATE (A) 11/27/2022 2120  Bactrim  DS 1 tablet twice daily for 3 days ordered        Relevant Orders   POCT URINE DIPSTICK (Completed)    Meds ordered this encounter  Medications   ibuprofen  (ADVIL ) 600 MG tablet    Sig: Take 1 tablet (600 mg total) by mouth every 8 (eight) hours as needed.    Dispense:  30 tablet    Refill:  0   cyclobenzaprine  (FLEXERIL ) 5 MG tablet    Sig: Take 1 tablet (5 mg total) by mouth 3 (three) times daily as needed.    Dispense:  30 tablet    Refill:  0   amLODipine (NORVASC) 2.5 MG tablet    Sig: Take 1 tablet (2.5 mg total) by mouth daily.    Dispense:  60 tablet    Refill:  1   ketorolac  (TORADOL ) 30 MG/ML injection 30 mg   methylPREDNISolone  sodium succinate (SOLU-MEDROL ) 40 MG injection 40 mg    No follow-ups on file.  Thierno Hun R Michaeal Davis, FNP

## 2024-04-29 ENCOUNTER — Telehealth: Payer: Self-pay

## 2024-05-07 ENCOUNTER — Other Ambulatory Visit: Payer: Self-pay

## 2024-05-07 NOTE — Patient Instructions (Signed)
 Visit Information  Ms. Mckee was given information about Medicaid Managed Care team care coordination services as a part of their Healthy [facility] Medicaid benefit. Virlinda Grimmer Marchena verbally consented to engagement with the [facility] Managed Care team.   If you are experiencing a medical emergency, please call 911 or report to your local emergency department or urgent care.   If you have a non-emergency medical problem during routine business hours, please contact your provider's office and ask to speak with a nurse.   For questions related to your Healthy [facility] health plan, please call: [phone] or visit the homepage here: [email or web address]  If you would like to schedule transportation through your Healthy [facility] plan, please call the following number at least 2 days in advance of your appointment: [phone]  For information about your ride after you set it up, call Ride Assist at [phone]. Use this number to activate a Will Call pickup, or if your transportation is late for a scheduled pickup. Use this number, too, if you need to make a change or cancel a previously scheduled reservation.  If you need transportation services right away, call [phone]. The after-hours call center is staffed 24 hours to handle ride assistance and urgent reservation requests (including discharges) 365 days a year. Urgent trips include sick visits, hospital discharge requests and life-sustaining treatment.  Call the [facility] Line at [phone], at any time, 24 hours a day, 7 days a week. If you are in danger or need immediate medical attention call 911.  If you would like help to quit smoking, call 1-800-QUIT-NOW ((732) 498-7363) OR Espaol: 1-855-Djelo-Ya (8-756-433-2951) o para ms informacin haga clic aqu or Text READY to 884-166 to register via text  Ms. Caspers - following are the goals we discussed in your visit  today:   Goals Addressed             This Visit's Progress    COMPLETED: BSW VBCI Social Work Care Plan       Problems:   Corporate treasurer , Geophysicist/field seismologist , and Housing   CSW Clinical Goal(s):   Over the next 30 days the Patient will work with resources provided to address needs related to Housing, Education officer, community, food and financial strain..  Interventions:  Social Determinants of Health in Patient with community resources: SDOH assessments completed: Corporate treasurer , Geophysicist/field seismologist , and Housing  Evaluation of current treatment plan related to unmet needs Housing resources:affordable emailed to emailed to address on file, SW also included resources for the dentist, food pantries and job boards. SW completed follow up call with patient, resources were received and she has used some of them.  Patient Goals/Self-Care Activities:  Utilize resources once received.   Plan:   The patient has been provided with contact information for the care management team and has been advised to call with any health related questions or concerns.           The  Patient                                              has been provided with contact information for the Managed Medicaid care management team and has been advised to call with any health related questions or concerns.   Valora Gear, BSW, M[facility] [place]  Value Based [facility] Institute Social Worker, Population Health [phone]  Following is a copy of your plan of care:  There are no care plans that you recently modified to display for this patient.

## 2024-05-07 NOTE — Patient Outreach (Signed)
 Complex Care Management   Visit Note  05/07/2024  Name:  Sabrina Larson MRN: [id number] DOB: August 11, 1976  Situation: Referral received for Complex Care Management related to SDOH Barriers:  Housing living with a friend Food insecurity I obtained verbal consent from Patient.  Visit completed with patient  on the phone  Background:   Past Medical History:  Diagnosis Date   Anemia    Blood transfusion without reported diagnosis    COVID 10/2022   resolved   GERD (gastroesophageal reflux disease)    Hypertension    Pre-diabetes     Assessment: SW completed a telephone outreach with patient, she did receive the resources SW sent, and has used some of them. She is still going back and forth to [facility] each day. Patient states no other resources are needed at this time. Patient does have SW contact information for any additional needs.    SDOH Interventions    Flowsheet Row Patient Outreach Telephone from 04/23/2024 in [place] POPULATION HEALTH DEPARTMENT Patient Outreach Telephone from 04/22/2024 in [place] POPULATION HEALTH DEPARTMENT  SDOH Interventions    Food Insecurity Interventions Community Resources Provided Other (Comment)  [Patient has an appointment with BSW scheduled 04/23/24. She currently receives food stamps and reports they are raising her amoutn next month. She would like a list a food pantries.]  Housing Interventions Community Resources Provided Other (Comment)  [Patient has appointment with BSW 04/23/24]  Transportation Interventions -- Intervention Not Indicated  Utilities Interventions -- Intervention Not Indicated  Financial Strain Interventions Community Resources Provided --       Recommendation:   No recommendations at this time  Follow Up Plan:   Patient has met all care management goals. Care Management case will be closed. Patient has been provided contact information should new needs arise.   Sabrina Larson, Sabrina Larson, Sabrina Larson  Value  Based Care Institute Social Worker, Population Health [phone]

## 2024-05-11 DIAGNOSIS — R32 Unspecified urinary incontinence: Secondary | ICD-10-CM | POA: Diagnosis not present

## 2024-05-13 ENCOUNTER — Telehealth: Payer: Self-pay | Admitting: *Deleted

## 2024-05-13 NOTE — Progress Notes (Signed)
 Complex Care Management Care Guide Note  05/13/2024 Name: Sabrina Larson MRN: [id number] DOB: [birth year]  Sabrina Larson is a 48 y.o. year old female who is a primary care patient of Collins Dean, NP and is actively engaged with the care management team. I reached out to [employer] by phone today to assist with re-scheduling  with the RN Case Manager.  Follow up plan: Unsuccessful telephone outreach attempt made. A HIPAA compliant phone message was left for the patient providing contact information and requesting a return call.  Sabrina Larson  [facility]  Value-Based Care Institute, [facility] Guide  Direct Dial : [phone]  Fax [phone]

## 2024-05-14 NOTE — Progress Notes (Signed)
 Complex Care Management Care Guide Note  05/14/2024 Name: Sabrina Larson MRN: [id number] DOB: 05-Mar-1976  Sabrina Larson is a 48 y.o. year old female who is a primary care patient of Collins Dean, NP and is actively engaged with the care management team. I reached out to [employer] by phone today to assist with re-scheduling  with the RN Case Manager.  Follow up plan: Telephone appointment with complex care management team member scheduled for:  6/27  Sabrina Larson  [facility]  Value-Based Care Institute, [facility] Guide  Direct Dial : [phone]  Fax [phone]

## 2024-05-26 ENCOUNTER — Ambulatory Visit (INDEPENDENT_AMBULATORY_CARE_PROVIDER_SITE_OTHER): Admitting: Primary Care

## 2024-05-26 ENCOUNTER — Encounter: Payer: Self-pay | Admitting: Physician Assistant

## 2024-05-26 ENCOUNTER — Other Ambulatory Visit (HOSPITAL_COMMUNITY)
Admission: RE | Admit: 2024-05-26 | Discharge: 2024-05-26 | Disposition: A | Discharge status: Home / Self Care | Source: Ambulatory Visit | Attending: Physician Assistant | Admitting: Physician Assistant

## 2024-05-26 ENCOUNTER — Ambulatory Visit: Payer: Self-pay

## 2024-05-26 ENCOUNTER — Ambulatory Visit: Admitting: Physician Assistant

## 2024-05-26 VITALS — BP 147/98 | HR 80 | Ht 65.5 in | Wt 195.0 lb

## 2024-05-26 DIAGNOSIS — I10 Essential (primary) hypertension: Secondary | ICD-10-CM | POA: Diagnosis not present

## 2024-05-26 DIAGNOSIS — B9689 Other specified bacterial agents as the cause of diseases classified elsewhere: Secondary | ICD-10-CM

## 2024-05-26 DIAGNOSIS — N760 Acute vaginitis: Secondary | ICD-10-CM | POA: Diagnosis not present

## 2024-05-26 DIAGNOSIS — N3000 Acute cystitis without hematuria: Secondary | ICD-10-CM

## 2024-05-26 LAB — POCT URINALYSIS DIP (CLINITEK)
Bilirubin, UA: NEGATIVE
Glucose, UA: NEGATIVE mg/dL
Ketones, POC UA: NEGATIVE mg/dL
Nitrite, UA: NEGATIVE
POC PROTEIN,UA: NEGATIVE
Spec Grav, UA: >=1.03 — AB (ref 1.010–1.025)
Urobilinogen, UA: 0.2 U/dL — AB
pH, UA: 6.5 (ref 5.0–8.0)

## 2024-05-26 MED ORDER — AMOXICILLIN-POT CLAVULANATE 875-125 MG PO TABS: 1.0000 | ORAL_TABLET | Freq: Two times a day (BID) | ORAL | 0 refills | Status: AC

## 2024-05-26 MED ORDER — HYDROCHLOROTHIAZIDE 12.5 MG PO TABS: 12.5000 mg | ORAL_TABLET | Freq: Every day | ORAL | 1 refills | Status: AC

## 2024-05-26 MED ORDER — METRONIDAZOLE 500 MG PO TABS
500.0000 mg | ORAL_TABLET | Freq: Two times a day (BID) | ORAL | 0 refills | Status: AC
Start: 2024-05-26 — End: 2024-06-02

## 2024-05-26 NOTE — Patient Instructions (Addendum)
 VISIT SUMMARY:  During your visit, we discussed your recurrent urinary tract infections, bacterial vaginosis, and hypertension. We have adjusted your medications and provided new prescriptions to address your symptoms and improve your overall health.  YOUR PLAN:  -RECURRENT URINARY TRACT INFECTION (UTI): A recurrent UTI is a repeated infection of the urinary tract. We will send your urine for culture and have prescribed Augmentin  875 mg to be taken twice daily for 7 days. Please continue to drink plenty of water.  -BACTERIAL VAGINOSIS: Bacterial vaginosis is an infection caused by an imbalance of bacteria in the vagina. We will perform a vaginal swab to confirm the diagnosis and have prescribed Metronidazole  500 mg to be taken twice daily for 7 days.  -HYPERTENSION: Hypertension is high blood pressure. We are restarting hydrochlorothiazide  at a lower dose to manage your blood pressure. Please monitor your blood pressure at home or at a pharmacy and follow-up with the [facility] in 2 weeks.  How to Take Your Blood Pressure Blood pressure is a measurement of how strongly your blood is pressing against the walls of your arteries. Arteries are blood vessels that carry blood from your heart throughout your body. Your health care provider takes your blood pressure at each office visit. You can also take your own blood pressure at home with a blood pressure monitor. You may need to take your own blood pressure to: Confirm a diagnosis of high blood pressure (hypertension). Monitor your blood pressure over time. Make sure your blood pressure medicine is working. Supplies needed: Blood pressure monitor. A chair to sit in. This should be a chair where you can sit upright with your back supported. Do not sit on a soft couch or an armchair. Table or desk. Small notebook and pencil or pen. How to prepare To get the most accurate reading, avoid the following for 30 minutes before you check your blood  pressure: Drinking caffeine. Drinking alcohol. Eating. Smoking. Exercising. Five minutes before you check your blood pressure: Use the bathroom and urinate so that you have an empty bladder. Sit quietly in a chair. Do not talk. How to take your blood pressure To check your blood pressure, follow the instructions in the manual that came with your blood pressure monitor. If you have a digital blood pressure monitor, the instructions may be as follows: Sit up straight in a chair. Place your feet on the floor. Do not cross your ankles or legs. Rest your left arm at the level of your heart on a table or desk or on the arm of a chair. Pull up your shirt sleeve. Wrap the blood pressure cuff around the upper part of your left arm, 1 inch (2.5 cm) above your elbow. It is best to wrap the cuff around bare skin. Fit the cuff snugly, but not too tightly, around your arm. You should be able to place only one finger between the cuff and your arm. Position the cord so that it rests in the bend of your elbow. Press the power button. Sit quietly while the cuff inflates and deflates. Read the digital reading on the monitor screen and write the numbers down (record them) in a notebook. Wait 2-3 minutes, then repeat the steps, starting at step 1. What does my blood pressure reading mean? A blood pressure reading consists of a higher number over a lower number. Ideally, your blood pressure should be below 120/80. The first (top) number is called the systolic pressure. It is a measure of the pressure in your  arteries as your heart beats. The second (bottom) number is called the diastolic pressure. It is a measure of the pressure in your arteries as the heart relaxes. Blood pressure is classified into four stages. The following are the stages for adults who do not have a short-term serious illness or a chronic condition. Systolic pressure and diastolic pressure are measured in a unit called mm Hg (millimeters  of mercury).  Normal Systolic pressure: below 120. Diastolic pressure: below 80. Elevated Systolic pressure: 120-129. Diastolic pressure: below 80. Hypertension stage 1 Systolic pressure: 130-139. Diastolic pressure: 80-89. Hypertension stage 2 Systolic pressure: 140 or above. Diastolic pressure: 90 or above. You can have elevated blood pressure or hypertension even if only the systolic or only the diastolic number in your reading is higher than normal. Follow these instructions at home: Medicines Take over-the-counter and prescription medicines only as told by your health care provider. Tell your health care provider if you are having any side effects from blood pressure medicine. General instructions Check your blood pressure as often as recommended by your health care provider. Check your blood pressure at the same time every day. Take your monitor to the next appointment with your health care provider to make sure that: You are using it correctly. It provides accurate readings. Understand what your goal blood pressure numbers are. Keep all follow-up visits. This is important. General tips Your health care provider can suggest a reliable monitor that will meet your needs. There are several types of home blood pressure monitors. Choose a monitor that has an arm cuff. Do not choose a monitor that measures your blood pressure from your wrist or finger. Choose a cuff that wraps snugly, not too tight or too loose, around your upper arm. You should be able to fit only one finger between your arm and the cuff. You can buy a blood pressure monitor at most drugstores or online. Where to find more information American Heart Association: www.heart.org Contact a health care provider if: Your blood pressure is consistently high. Your blood pressure is suddenly low. Get help right away if: Your systolic blood pressure is higher than 180. Your diastolic blood pressure is higher than  120. These symptoms may be an emergency. Get help right away. Call 911. Do not wait to see if the symptoms will go away. Do not drive yourself to the hospital. Summary Blood pressure is a measurement of how strongly your blood is pressing against the walls of your arteries. A blood pressure reading consists of a higher number over a lower number. Ideally, your blood pressure should be below 120/80. Check your blood pressure at the same time every day. Avoid caffeine, alcohol, smoking, and exercise for 30 minutes prior to checking your blood pressure. These agents can affect the accuracy of the blood pressure reading. This information is not intended to replace advice given to you by your health care provider. Make sure you discuss any questions you have with your health care provider. Document Revised: 08/03/2021 Document Reviewed: 08/03/2021 Elsevier Patient Education  2024 [employer].

## 2024-05-26 NOTE — Progress Notes (Signed)
 Established Patient Office Visit  Subjective   Patient ID: Sabrina Larson, female    DOB: 03-19-1976  Age: 48 y.o. MRN: [id number]  Chief Complaint  Patient presents with   Urinary Tract Infection        Dysuria    Patient states she was treated for a UTI, end of May. Her symptoms of dysuria and urinary frequency has returned    Discussed the use of AI scribe software for clinical note transcription with the patient, who gave verbal consent to proceed.  History of Present Illness   Sabrina Larson is a 48 year old female with recurrent urinary tract infections who presents with burning urination and discharge.  She was seen and treated for a urinary tract infection Apr 24, 2024.  States that she completed a 3-day course of Bactrim .  States that she did have some improvement for her dysuria at that time, but states that her symptoms returned approximately a week later.  She also endorses having a white discharge that started approximately 1 week after the antibiotic as well.  Denies vaginal itching.  Her urine now has a foul odor. She reports increased frequency of urination.  She has mild pain in the lower right back. There is no itching, nausea, vomiting, fever, or chills.  She also has a history of bacterial vaginitis.  She feels that this her discharge is similar to that.   She is drinking lots of water and has not tried other treatments besides Bactrim .   States that she previously took hydrochlorothiazide  for hypertension, states that she felt it helped control her blood pressure.  States that she had not been taking it and was seen on May 23 and was started on amlodipine  2.5 mg.  States that she did take the amlodipine  once, but felt very fatigued the next day and did not want to continue that medication.    Past Medical History:  Diagnosis Date   Anemia    Blood transfusion without reported diagnosis    COVID 10/2022   resolved   GERD  (gastroesophageal reflux disease)    Hypertension    Pre-diabetes    Social History   Socioeconomic History   Marital status: Single    Spouse name: Not on file   Number of children: Not on file   Years of education: Not on file   Highest education level: Bachelor's degree (e.g., BA, AB, BS)  Occupational History   Not on file  Tobacco Use   Smoking status: Former    Current packs/day: 0.00    Types: Cigarettes, E-cigarettes    Quit date: 07/04/2021    Years since quitting: 2.8   Smokeless tobacco: Never  Vaping Use   Vaping status: Some Days   Substances: Flavoring  Substance and Sexual Activity   Alcohol use: Not Currently    Comment: rarely 1-2 glasses per year   Drug use: Yes    Types: Marijuana    Comment: daily - last used 11-30-23   Sexual activity: Yes    Birth control/protection: Other-see comments, None    Comment: Hx MAB - retainedproducts of conceptions  Other Topics Concern   Not on file  Social History Narrative   Not on file   Social Drivers of Health   Financial Resource Strain: High Risk (04/23/2024)   Overall Financial Resource Strain (CARDIA)    Difficulty of Paying Living Expenses: Very hard  Food Insecurity: Food Insecurity Present (04/22/2024)   Hunger Vital Sign  Worried About Programme researcher, broadcasting/film/video in the Last Year: Sometimes true    [employer] of Food in the Last Year: Sometimes true  Transportation Needs: No Transportation Needs (04/22/2024)   PRAPARE - Administrator, Civil Service (Medical): No    Lack of Transportation (Non-Medical): No  Physical Activity: Unknown (09/30/2023)   Exercise Vital Sign    Days of Exercise per Week: 1 day    Minutes of Exercise per Session: Not on file  Stress: No Stress Concern Present (09/30/2023)   [employer] of Occupational Health - Occupational Stress Questionnaire    Feeling of Stress : Only a little  Social Connections: Moderately Integrated (09/30/2023)   Social Connection and  Isolation Panel    Frequency of Communication with Friends and Family: More than three times a week    Frequency of Social Gatherings with Friends and Family: Once a week    Attends Religious Services: More than 4 times per year    Active Member of [employer] or Organizations: Yes    Attends Banker Meetings: More than 4 times per year    Marital Status: Never married  Intimate Partner Violence: Not At Risk (04/22/2024)   Humiliation, Afraid, Rape, and Kick questionnaire    Fear of Current or Ex-Partner: No    Emotionally Abused: No    Physically Abused: No    Sexually Abused: No   Family History  Problem Relation Age of Onset   Colon polyps Mother    Colon cancer Mother 41   Asthma Mother    Hypertension Mother    Depression Mother    Diabetes Mother    Drug abuse Mother    Asthma Brother    Esophageal cancer Neg Hx    Rectal cancer Neg Hx    Stomach cancer Neg Hx    Allergies  Allergen Reactions   Lactose Other (See Comments)    Gi-upset    Review of Systems  Constitutional:  Negative for chills and fever.  HENT: Negative.    Eyes: Negative.   Respiratory:  Negative for shortness of breath.   Cardiovascular:  Negative for chest pain.  Gastrointestinal:  Negative for abdominal pain, nausea and vomiting.  Genitourinary:  Positive for dysuria, frequency and urgency. Negative for hematuria.  Musculoskeletal:  Positive for back pain.  Skin: Negative.   Neurological: Negative.   Endo/Heme/Allergies: Negative.   Psychiatric/Behavioral: Negative.        Objective:     BP (!) 147/98 (BP Location: Left Arm, Patient Position: Sitting, Cuff Size: Large)   Pulse 80   Ht 5' 5.5 (1.664 m)   Wt 195 lb (88.5 kg)   LMP 05/07/2024   BMI 31.96 kg/m  BP Readings from Last 3 Encounters:  05/26/24 (!) 147/98  04/24/24 (!) 154/88  02/27/24 (!) 157/96   Wt Readings from Last 3 Encounters:  05/26/24 195 lb (88.5 kg)  04/24/24 204 lb (92.5 kg)  02/27/24 210 lb (95.3  kg)    Physical Exam Vitals and nursing note reviewed.  Constitutional:      Appearance: Normal appearance.  HENT:     Head: Normocephalic and atraumatic.     Right Ear: External ear normal.     Left Ear: External ear normal.     Nose: Nose normal.     Mouth/Throat:     Mouth: Mucous membranes are moist.     Pharynx: Oropharynx is clear.   Eyes:     Extraocular Movements: Extraocular  movements intact.     Conjunctiva/sclera: Conjunctivae normal.     Pupils: Pupils are equal, round, and reactive to light.    Cardiovascular:     Rate and Rhythm: Normal rate and regular rhythm.     Pulses: Normal pulses.     Heart sounds: Normal heart sounds.  Pulmonary:     Effort: Pulmonary effort is normal.     Breath sounds: Normal breath sounds.  Abdominal:     Tenderness: There is right CVA tenderness. There is no left CVA tenderness.   Musculoskeletal:        General: Normal range of motion.     Cervical back: Normal range of motion and neck supple.   Skin:    General: Skin is warm and dry.   Neurological:     General: No focal deficit present.     Mental Status: She is alert.   Psychiatric:        Mood and Affect: Mood normal.        Behavior: Behavior normal.        Thought Content: Thought content normal.        Judgment: Judgment normal.        Assessment & Plan:   Problem List Items Addressed This Visit       Other   Dysuria - Primary   Relevant Medications   amoxicillin -clavulanate (AUGMENTIN ) 875-125 MG tablet   Other Visit Diagnoses       Bacterial vaginitis       Relevant Medications   metroNIDAZOLE  (FLAGYL ) 500 MG tablet   Other Relevant Orders   Cervicovaginal ancillary only     Essential hypertension       Relevant Medications   hydrochlorothiazide  (HYDRODIURIL ) 12.5 MG tablet     Elevated blood pressure reading in office with diagnosis of hypertension       Relevant Medications   hydrochlorothiazide  (HYDRODIURIL ) 12.5 MG tablet     1.  Acute cystitis without hematuria (Primary) UA positive for leuks and blood, patient recently completed a course of antibiotics.  Will treat based on clinical presentation, trial Augmentin , the selection was based on previous urine culture in February.  Patient education given on supportive care.  Red flags given for prompt reevaluation - POCT URINALYSIS DIP (CLINITEK) - amoxicillin -clavulanate (AUGMENTIN ) 875-125 MG tablet; Take 1 tablet by mouth 2 (two) times daily for 7 days.  Dispense: 14 tablet; Refill: 0 - Urine Culture  2. Bacterial vaginitis Trial Flagyl .  Patient treated based on clinical presentation - metroNIDAZOLE  (FLAGYL ) 500 MG tablet; Take 1 tablet (500 mg total) by mouth 2 (two) times daily for 7 days.  Dispense: 14 tablet; Refill: 0 - Cervicovaginal ancillary only  3. Essential hypertension Patient declines continuing amlodipine  trial, wants to restart hydrochlorothiazide , will restart at 12.5 mg.  Patient encouraged to check blood pressure at home, keep a written log and have available for all office visits. - hydrochlorothiazide  (HYDRODIURIL ) 12.5 MG tablet; Take 1 tablet (12.5 mg total) by mouth daily.  Dispense: 30 tablet; Refill: 1  4. Elevated blood pressure reading in office with diagnosis of hypertension   Patient to follow-up with mobile unit in 2 weeks.   I have reviewed the patient's medical history (PMH, PSH, Social History, Family History, Medications, and allergies) , and have been updated if relevant. I spent 30 minutes reviewing chart and  face to face time with patient.    Return in about 2 weeks (around 06/09/2024) for With [facility].    Louvinia Cumbo  S Mayers, PA-C

## 2024-05-26 NOTE — Telephone Encounter (Signed)
 FYI Only or Action Required?: FYI only for provider.  Patient was last seen in primary care on 04/24/2024 by Paseda, Folashade R, FNP. Called Nurse Triage reporting Dysuria and Urinary Tract Infection. Symptoms began several weeks ago. Interventions attempted: Prescription medications: Bactrim  x 3 days. Symptoms are: blood in urine, urinary frequency, back pain, burning with urination, vaginal discharge, foul odor in urineimproved for 2 days after antibiotics and then returned.  Triage Disposition: See HCP Within 4 Hours (Or PCP Triage)  Patient/caregiver understands and will follow disposition?: Yes                      Copied from CRM [id number]. Topic: Clinical - Red Word Triage >> May 26, 2024  8:29 AM Ivette P wrote: Red Word that prompted transfer to Nurse Triage: Urine burn when urinating, different 2 weeks ago. Medicaiton not working. Reason for Disposition  [1] Side (flank) or lower back pain AND [2] new-onset since starting antibiotics  Answer Assessment - Initial Assessment Questions Called CAL and confirmed okay to scheduled patient with [facility] due to no availability at [facility].   1. MAIN SYMPTOM: What is the main symptom you are concerned about? (e.g., painful urination, urine frequency)     Foul odor, painful burning with urination, urinary frequency.  2. BETTER-SAME-WORSE: Are you getting better, staying the same, or getting worse compared to how you felt at your last visit to the doctor (most recent medical visit)?     Improved maybe 2 days and then came back.  3. PAIN: How bad is the pain?  (e.g., Scale 1-10; mild, moderate, or severe)   - MILD (1-3): complains slightly about urination hurting   - MODERATE (4-7): interferes with normal activities     - SEVERE (8-10): excruciating, unwilling or unable to urinate because of the pain      5-7/10.   4. FEVER: Do you have a fever? If Yes, ask: What is it, how was it measured, and when did it start?      No  5. OTHER SYMPTOMS: Do you have any other symptoms? (e.g., blood in the urine, flank pain, vaginal discharge)     Light red blood in urine, back pain (intermittent), vaginal discharge (white) . Patient denies fever, nausea, vomiting.  6. DIAGNOSIS: When was the UTI diagnosed? By whom? Was it a kidney infection, bladder infection or both?     04/24/24.  7. ANTIBIOTIC: What antibiotic(s) are you taking? How many times per day?     Bactrim  twice daily, x 3 days.  8. ANTIBIOTIC - START DATE: When did you start taking the antibiotic?     04/24/24.  Protocols used: Urinary Tract Infection on Antibiotic Follow-up Call - [facility]

## 2024-05-26 NOTE — Telephone Encounter (Signed)
 Noted

## 2024-05-27 LAB — CERVICOVAGINAL ANCILLARY ONLY
Bacterial Vaginitis (gardnerella): NEGATIVE
Candida Glabrata: NEGATIVE
Candida Vaginitis: NEGATIVE
Chlamydia: NEGATIVE
Comment: NEGATIVE
Comment: NEGATIVE
Comment: NEGATIVE
Comment: NEGATIVE
Comment: NEGATIVE
Comment: NORMAL
Neisseria Gonorrhea: NEGATIVE
Trichomonas: NEGATIVE

## 2024-05-28 ENCOUNTER — Ambulatory Visit: Payer: Self-pay | Admitting: Physician Assistant

## 2024-05-29 ENCOUNTER — Other Ambulatory Visit: Payer: Self-pay

## 2024-05-29 NOTE — Patient Instructions (Signed)
 Visit Information  Sabrina Larson was given information about Medicaid Managed Care team care coordination services as a part of their Healthy [facility] Medicaid benefit. Sabrina Larson verbally consented to engagement with the [facility] Managed Care team.   If you are experiencing a medical emergency, please call 911 or report to your local emergency department or urgent care.   If you have a non-emergency medical problem during routine business hours, please contact your provider's office and ask to speak with a nurse.   For questions related to your Healthy [facility] health plan, please call: [phone] or visit the homepage here: [email or web address]  If you would like to schedule transportation through your Healthy [facility] plan, please call the following number at least 2 days in advance of your appointment: [phone]  For information about your ride after you set it up, call Ride Assist at [phone]. Use this number to activate a Will Call pickup, or if your transportation is late for a scheduled pickup. Use this number, too, if you need to make a change or cancel a previously scheduled reservation.  If you need transportation services right away, call [phone]. The after-hours call center is staffed 24 hours to handle ride assistance and urgent reservation requests (including discharges) 365 days a year. Urgent trips include sick visits, hospital discharge requests and life-sustaining treatment.  Call the [facility] Line at [phone], at any time, 24 hours a day, 7 days a week. If you are in danger or need immediate medical attention call 911.  If you would like help to quit smoking, call 1-800-QUIT-NOW (787 876 6808) OR Espaol: 1-855-Djelo-Ya (8-144-664-6430) o para ms informacin haga clic aqu or Text READY to 799-599 to register via text  Sabrina Larson - following are the goals we discussed in your visit  today:   Goals Addressed             This Visit's Progress    COMPLETED: VBCI RN Care Plan       Problems:  Assistance establishing behavioral health services  Goal: Over the next 30 days the Patient will Establish primary care and behavioral health services with provider in [place] due to patient moving  Interventions:   Evaluation of current treatment plan related to behavioral health needs self-management and patient's adherence to plan as established by provider. Discussed plans with patient for ongoing care management follow up and provided patient with direct contact information for care management team Reviewed referral for Strong Minds, Strong Communities program through [place]. Patient screened ineligible for the program. She verbalizes understanding and will try to establish with [facility] provider in [place] after she moves.  Patient Self-Care Activities:  Attend all scheduled provider appointments Call provider office for new concerns or questions  call the Suicide and Crisis Lifeline: 988 call 1-800-273-TALK (toll free, 24 hour hotline) if experiencing a Mental Health or Behavioral Health Crisis  Plan:  No further follow up required: Patient has met CCM goals. She will be moving to [place] in the next week and will establish with a provider there. Ensured she has care team phone number for any needs that arise prior to establishing with new provider.             Patient verbalizes understanding of instructions and care plan provided today and agrees to view in MyChart. Active MyChart status and patient understanding of how to access instructions and care plan via MyChart confirmed with patient.     No further follow up required: Patient has met  CCM goals. She will be moving to [place] in the next week and will establish with a provider there. Ensured she has care team phone number for any needs that arise prior to establishing with new provider.  Sabrina Finlay, RN MSN [place]   VBCI Population Health RN Care Manager Direct Dial : [phone]  Fax: [phone]   Following is a copy of your plan of care:  There are no care plans that you recently modified to display for this patient.

## 2024-05-29 NOTE — Patient Outreach (Signed)
 Complex Care Management   Visit Note  05/29/2024  Name:  Sabrina Larson MRN: [id number] DOB: March 21, 1976  Situation: Referral received for Complex Care Management related to assistance establishing behavioral health services I obtained verbal consent from Patient.  Visit completed with Margareta Claxton  on the phone  Background:   Past Medical History:  Diagnosis Date   Anemia    Blood transfusion without reported diagnosis    COVID 10/2022   resolved   GERD (gastroesophageal reflux disease)    Hypertension    Pre-diabetes     Assessment: Patient Reported Symptoms:  Cognitive Cognitive Status: Able to follow simple commands, Alert and oriented to person, place, and time, Normal speech and language skills Cognitive/Intellectual Conditions Management [RPT]: None reported or documented in medical history or problem list      Neurological Neurological Review of Symptoms: Not assessed    HEENT HEENT Symptoms Reported: Not assessed      Cardiovascular Cardiovascular Symptoms Reported: No symptoms reported Does patient have uncontrolled Hypertension?: Yes Is patient checking Blood Pressure at home?: Yes (Checking at [employer]) Patient's Recent BP reading at home: 147/98 at office visit the other day, similar readings at [employer] Cardiovascular Conditions: Hypertension Cardiovascular Management Strategies: Exercise, Medication therapy, Diet modification, Routine screening Cardiovascular Comment: Patient has started to take HCTZ as ordered  Respiratory Respiratory Symptoms Reported: Other: Other Respiratory Symptoms: Patient reports a sore thoat that started yesterday. She has been drinking tea with lemon and gargling with salt water.    Endocrine Patient reports the following symptoms related to hypoglycemia or hyperglycemia : Not assessed    Gastrointestinal Gastrointestinal Symptoms Reported: Not assessed      Genitourinary Genitourinary Symptoms Reported: Other Other  Genitourinary Symptoms: Currently being treated for UTI, started abx 3 days ago. Symptoms included odor, blood, burning with urination. She has noted that odor and burning have decreased since starting abx. Educated on importance of taking full course of abx. Genitourinary Conditions: Urinary tract infection Genitourinary Management Strategies: Medication therapy  Integumentary Integumentary Symptoms Reported: Not assessed    Musculoskeletal Musculoskelatal Symptoms Reviewed: No symptoms reported Additional Musculoskeletal Details: Patient reports that difficulty walking has improved by exercising and weight loss. She has lost about 10 lbs intentionally. Musculoskeletal Management Strategies: Exercise, Adequate rest, Weight management Falls in the past year?: Yes Number of falls in past year: 1 or less Was there an injury with Fall?: No Fall Risk Category Calculator: 1 Patient Fall Risk Level: Low Fall Risk Patient at Risk for Falls Due to: No Fall Risks Fall risk Follow up: Falls evaluation completed  Psychosocial Psychosocial Symptoms Reported: No symptoms reported Additional Psychological Details: Patietn has screened ineligible for the Strong Mind, Strong Communities program through [place]. Patient reports that she is relocating to [place] 7/5-7/10/25, so is looking for jobs and providers there.            05/26/2024    9:49 AM  Depression screen PHQ 2/9  Decreased Interest 0  Down, Depressed, Hopeless 0  PHQ - 2 Score 0  Altered sleeping 0  Tired, decreased energy 0  Change in appetite 1  Feeling bad or failure about yourself  0  Trouble concentrating 0  Moving slowly or fidgety/restless 0  Suicidal thoughts 0  PHQ-9 Score 1  Difficult doing work/chores Not difficult at all    There were no vitals filed for this visit.  Medications Reviewed Today     Reviewed by Arno Rosaline SQUIBB, RN (Registered Nurse) on 05/29/24 at [phone]  Med List Status: <None>   Medication Order  Taking? Sig Documenting Provider Last Dose Status Informant  amLODipine  (NORVASC ) 2.5 MG tablet [id number]  Take 1 tablet (2.5 mg total) by mouth daily.  Patient not taking: Reported on 05/29/2024   Paseda, Folashade R, FNP  Active   amoxicillin -clavulanate (AUGMENTIN ) 875-125 MG tablet [id number] Yes Take 1 tablet by mouth 2 (two) times daily for 7 days. Mayers, Cari S, PA-C  Active   cyclobenzaprine  (FLEXERIL ) 5 MG tablet [id number]  Take 1 tablet (5 mg total) by mouth 3 (three) times daily as needed. Paseda, Folashade R, FNP  Active   ferrous sulfate  (FERROUSUL) 325 (65 FE) MG tablet [id number]  Take 1 tablet (325 mg total) by mouth every other day. Theotis Haze ORN, NP  Active Self  hydrochlorothiazide  (HYDRODIURIL ) 12.5 MG tablet [id number] Yes Take 1 tablet (12.5 mg total) by mouth daily. Mayers, Cari S, PA-C  Active   ibuprofen  (ADVIL ) 600 MG tablet [id number]  Take 1 tablet (600 mg total) by mouth every 8 (eight) hours as needed. Paseda, Folashade R, FNP  Active   Menthol-Camphor (TIGER BALM ARTHRITIS RUB EX) [id number]  Apply topically. [provider]  Active   metroNIDAZOLE  (FLAGYL ) 500 MG tablet [id number]  Take 1 tablet (500 mg total) by mouth 2 (two) times daily for 7 days. Mayers, Cari S, PA-C  Active   polyethylene glycol-electrolytes (NULYTELY) 420 g solution [id number]  Take 4,000 mLs by mouth once. [provider]  Active   Vitamin D , Ergocalciferol , (DRISDOL ) 1.25 MG (50000 UNIT) CAPS capsule [id number]  Take 1 capsule (50,000 Units total) by mouth every 7 (seven) days. Theotis Haze ORN, NP  Active             Recommendation:   Continue Current Plan of Care  Follow Up Plan:   Closing From:  Complex Care Management Patient has met CCM goals. She will be moving to [place] in the next week and will establish with a provider there. Ensured she has care team phone number for any needs that arise prior to establishing with new provider.  Rosaline Finlay, RN MSN [facility]  [facility] RN Care Manager Direct Dial : [phone]  Fax: [phone]

## 2024-05-30 LAB — URINE CULTURE

## 2024-06-17 ENCOUNTER — Other Ambulatory Visit: Payer: Self-pay | Admitting: Physician Assistant

## 2024-06-17 DIAGNOSIS — I10 Essential (primary) hypertension: Secondary | ICD-10-CM

## 2024-07-24 ENCOUNTER — Other Ambulatory Visit: Payer: Self-pay | Admitting: Nurse Practitioner

## 2024-07-24 DIAGNOSIS — I10 Essential (primary) hypertension: Secondary | ICD-10-CM
